# Patient Record
Sex: Female | Born: 1990 | Race: Black or African American | Hispanic: No | Marital: Married | Smoking: Former smoker
Health system: Southern US, Community
[De-identification: ages and names within clinical notes are randomized; demographics above are authoritative.]

## PROBLEM LIST (undated history)

## (undated) DIAGNOSIS — F419 Anxiety disorder, unspecified: Secondary | ICD-10-CM

## (undated) DIAGNOSIS — I499 Cardiac arrhythmia, unspecified: Secondary | ICD-10-CM

## (undated) DIAGNOSIS — F1090 Alcohol use, unspecified, uncomplicated: Secondary | ICD-10-CM

## (undated) DIAGNOSIS — Z87891 Personal history of nicotine dependence: Secondary | ICD-10-CM

## (undated) DIAGNOSIS — Z789 Other specified health status: Secondary | ICD-10-CM

## (undated) DIAGNOSIS — R5383 Other fatigue: Secondary | ICD-10-CM

## (undated) DIAGNOSIS — F329 Major depressive disorder, single episode, unspecified: Secondary | ICD-10-CM

## (undated) DIAGNOSIS — R87629 Unspecified abnormal cytological findings in specimens from vagina: Secondary | ICD-10-CM

## (undated) DIAGNOSIS — R195 Other fecal abnormalities: Secondary | ICD-10-CM

## (undated) DIAGNOSIS — F32A Depression, unspecified: Secondary | ICD-10-CM

## (undated) DIAGNOSIS — K30 Functional dyspepsia: Secondary | ICD-10-CM

## (undated) DIAGNOSIS — D649 Anemia, unspecified: Secondary | ICD-10-CM

## (undated) DIAGNOSIS — R634 Abnormal weight loss: Secondary | ICD-10-CM

## (undated) DIAGNOSIS — E119 Type 2 diabetes mellitus without complications: Secondary | ICD-10-CM

## (undated) DIAGNOSIS — F1291 Cannabis use, unspecified, in remission: Secondary | ICD-10-CM

## (undated) HISTORY — DX: Other fecal abnormalities: R19.5

## (undated) HISTORY — PX: FINGER SURGERY: SHX640

## (undated) HISTORY — DX: Unspecified abnormal cytological findings in specimens from vagina: R87.629

## (undated) HISTORY — DX: Abnormal weight loss: R63.4

## (undated) HISTORY — DX: Depression, unspecified: F32.A

## (undated) HISTORY — DX: Other fatigue: R53.83

## (undated) HISTORY — DX: Major depressive disorder, single episode, unspecified: F32.9

## (undated) HISTORY — DX: Type 2 diabetes mellitus without complications: E11.9

## (undated) HISTORY — DX: Anxiety disorder, unspecified: F41.9

---
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
## 2008-04-17 ENCOUNTER — Emergency Department: Payer: Self-pay | Admitting: Emergency Medicine

## 2008-04-22 ENCOUNTER — Ambulatory Visit: Payer: Self-pay | Admitting: Specialist

## 2008-04-24 ENCOUNTER — Ambulatory Visit: Payer: Self-pay | Admitting: Specialist

## 2008-06-05 IMAGING — US ULTRAOUND OB LIMITED - NRPT [facility]
1 series · 9 of 9 positions shown · non-contrast
Comparison: none

[Series 1: ultraound ob limited - [facility]pt [facility] · 9 of 9 slices shown]
[im 1/9]
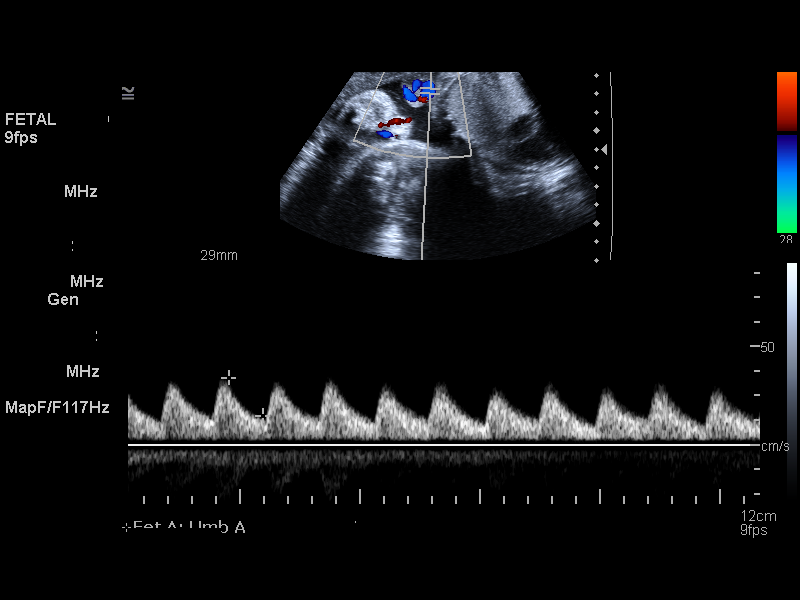
[im 2/9]
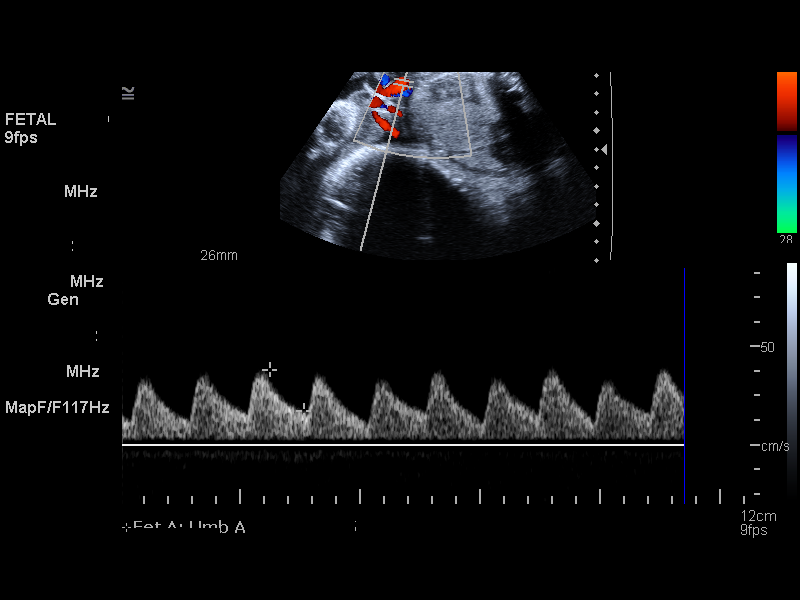
[im 3/9]
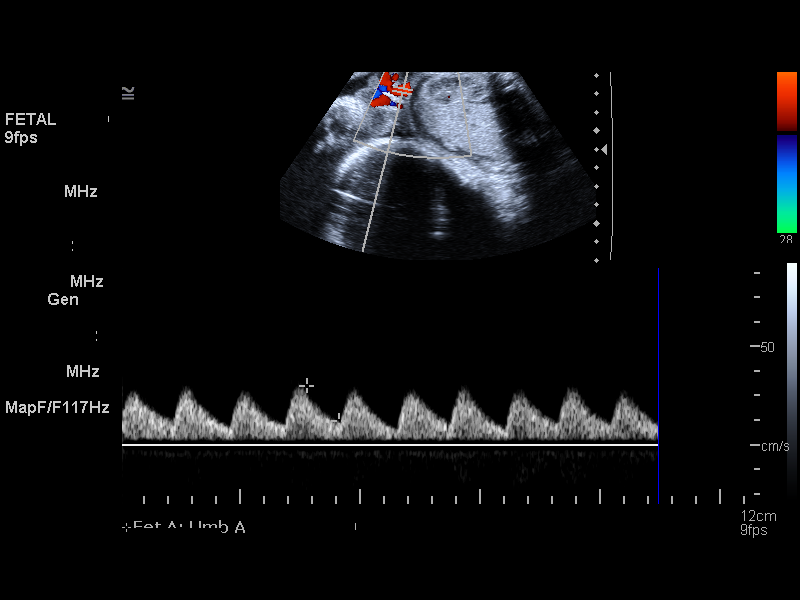
[im 4/9]
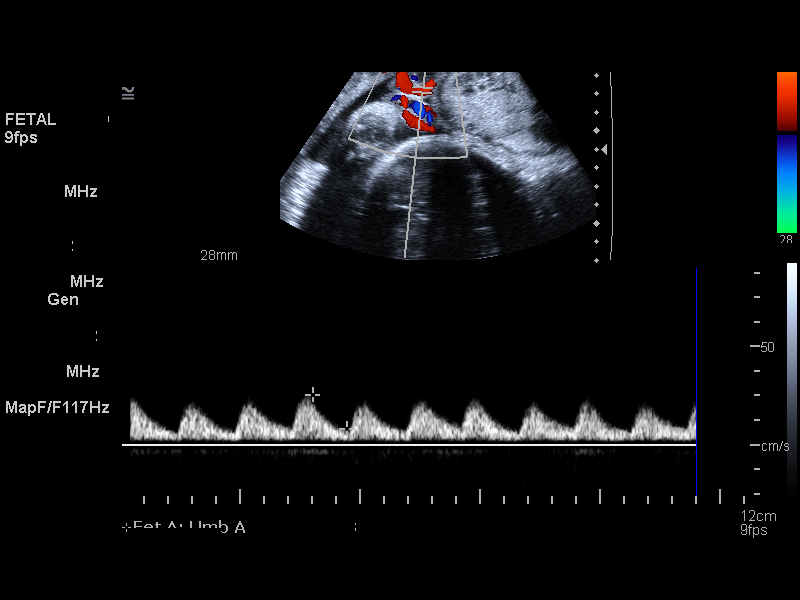
[im 5/9]
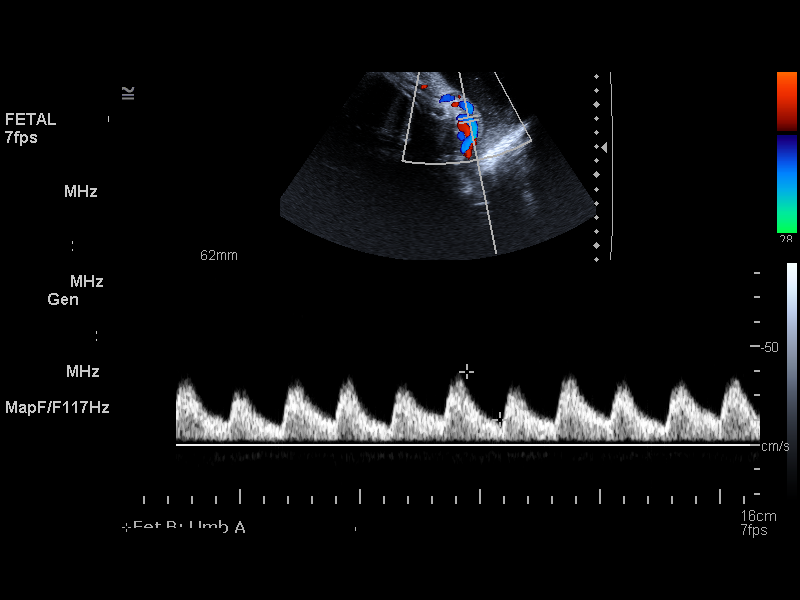
[im 6/9]
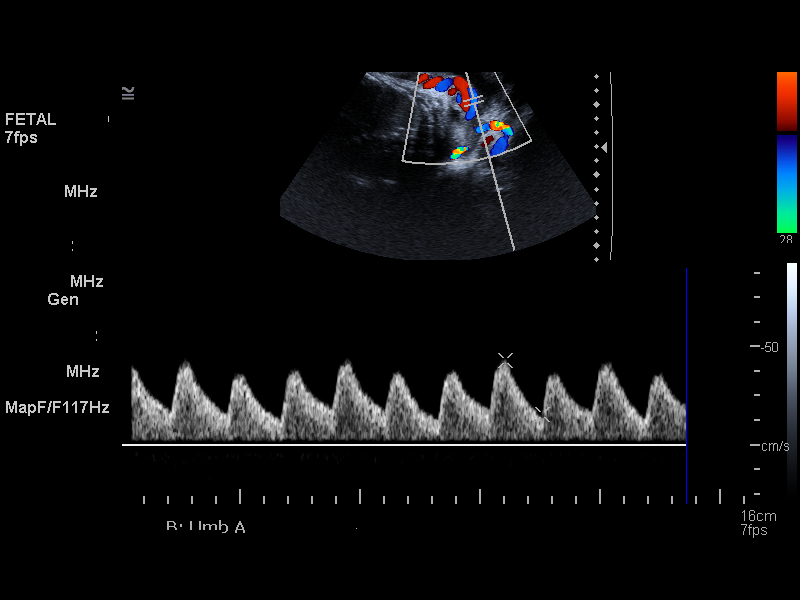
[im 7/9]
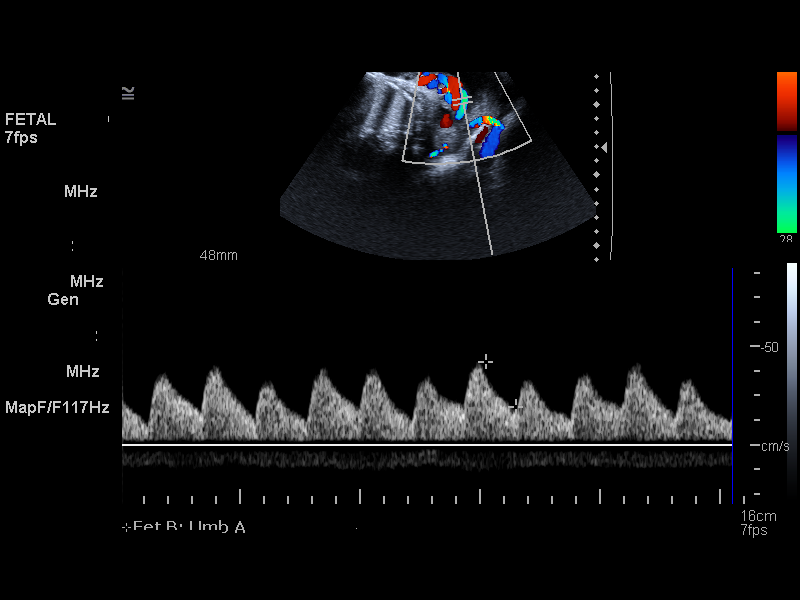
[im 8/9]
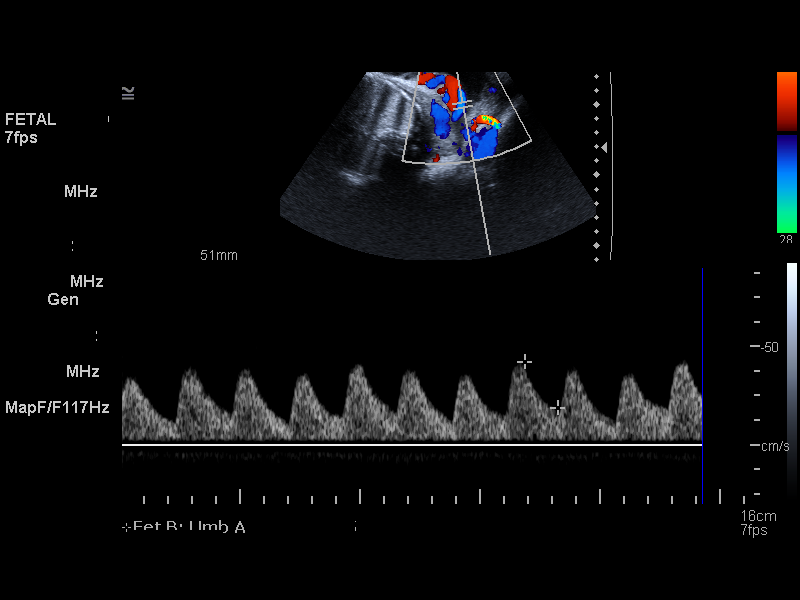
[im 9/9]
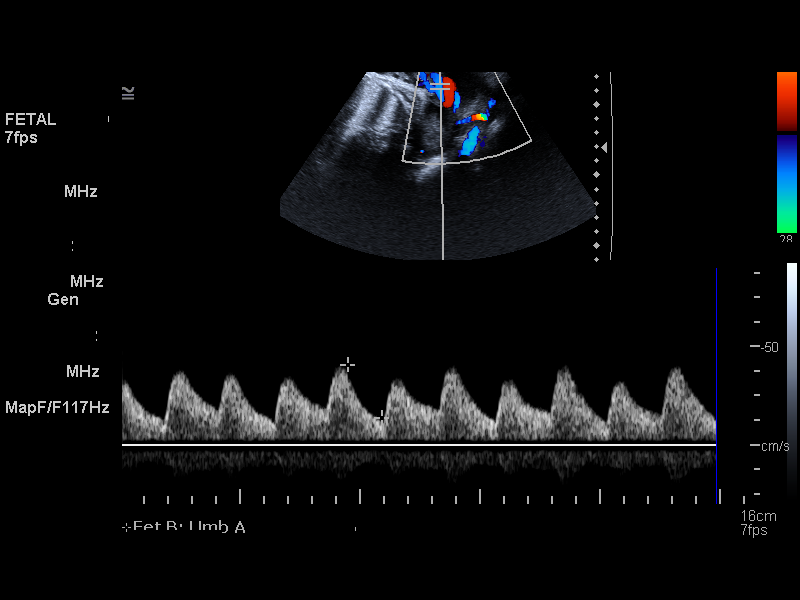

[9 of 9 positions shown; findings below may reference images not displayed]

IMAGES IMPORTED FROM THE [employer] WORKFLOW SYSTEM
NO DICTATION FOR STUDY

## 2009-03-11 IMAGING — CR RIGHT HAND - COMPLETE 3+ VIEW
1 series · 3 of 3 positions shown · non-contrast
Comparison: none

REASON FOR EXAM: blunt injury 4th digit pain left hand
COMMENTS:

[Series 1: view not recorded · 0.17mm/px · 3 of 3 slices shown]
[im 1/3]
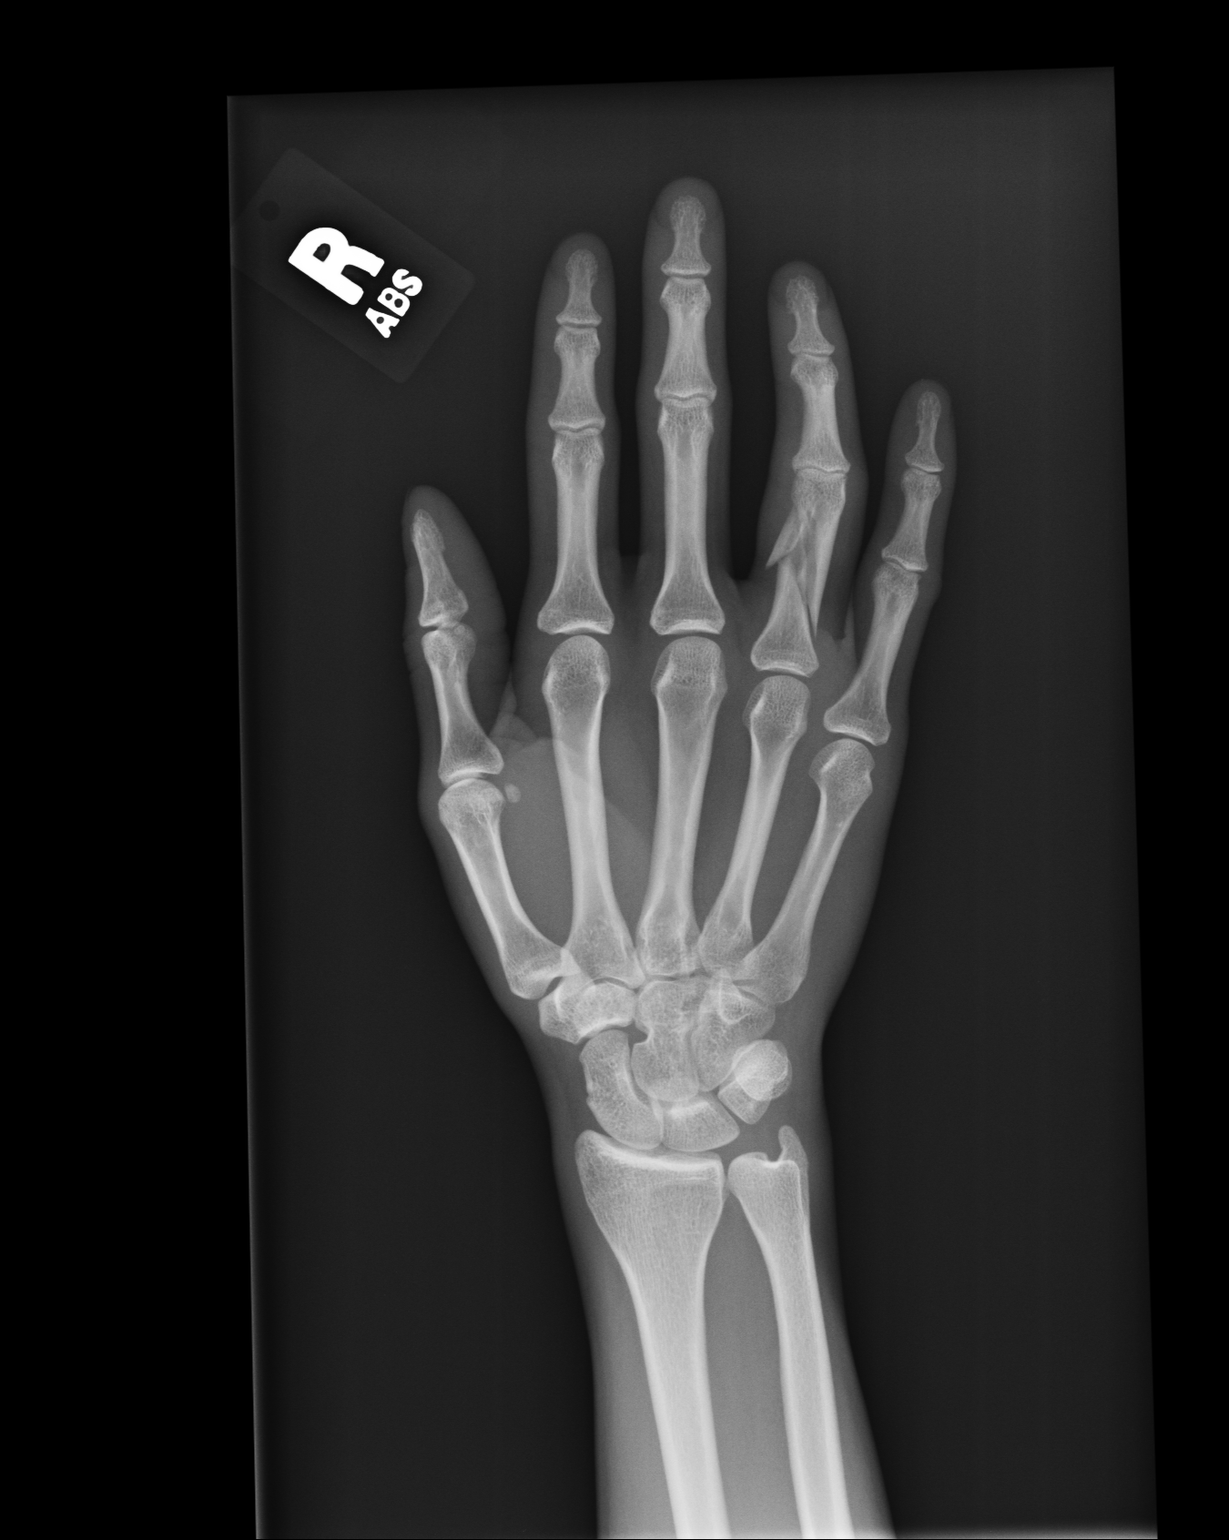
[im 2/3]
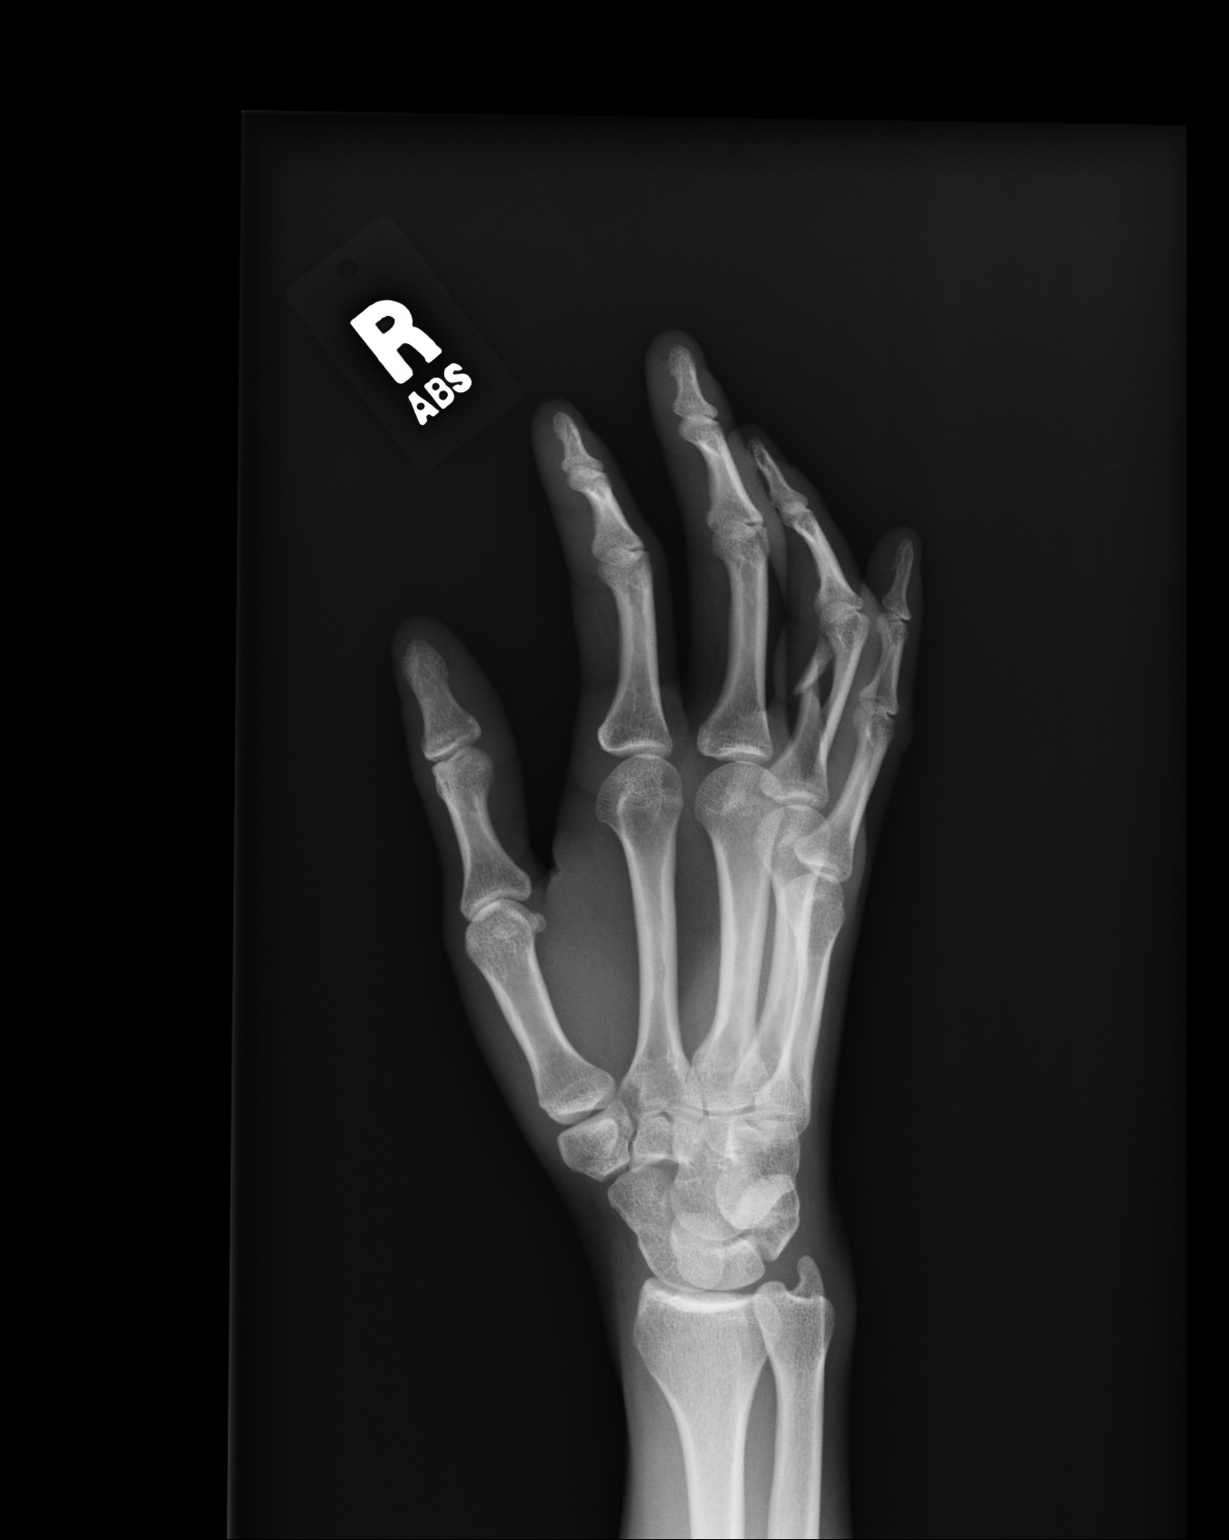
[im 3/3]
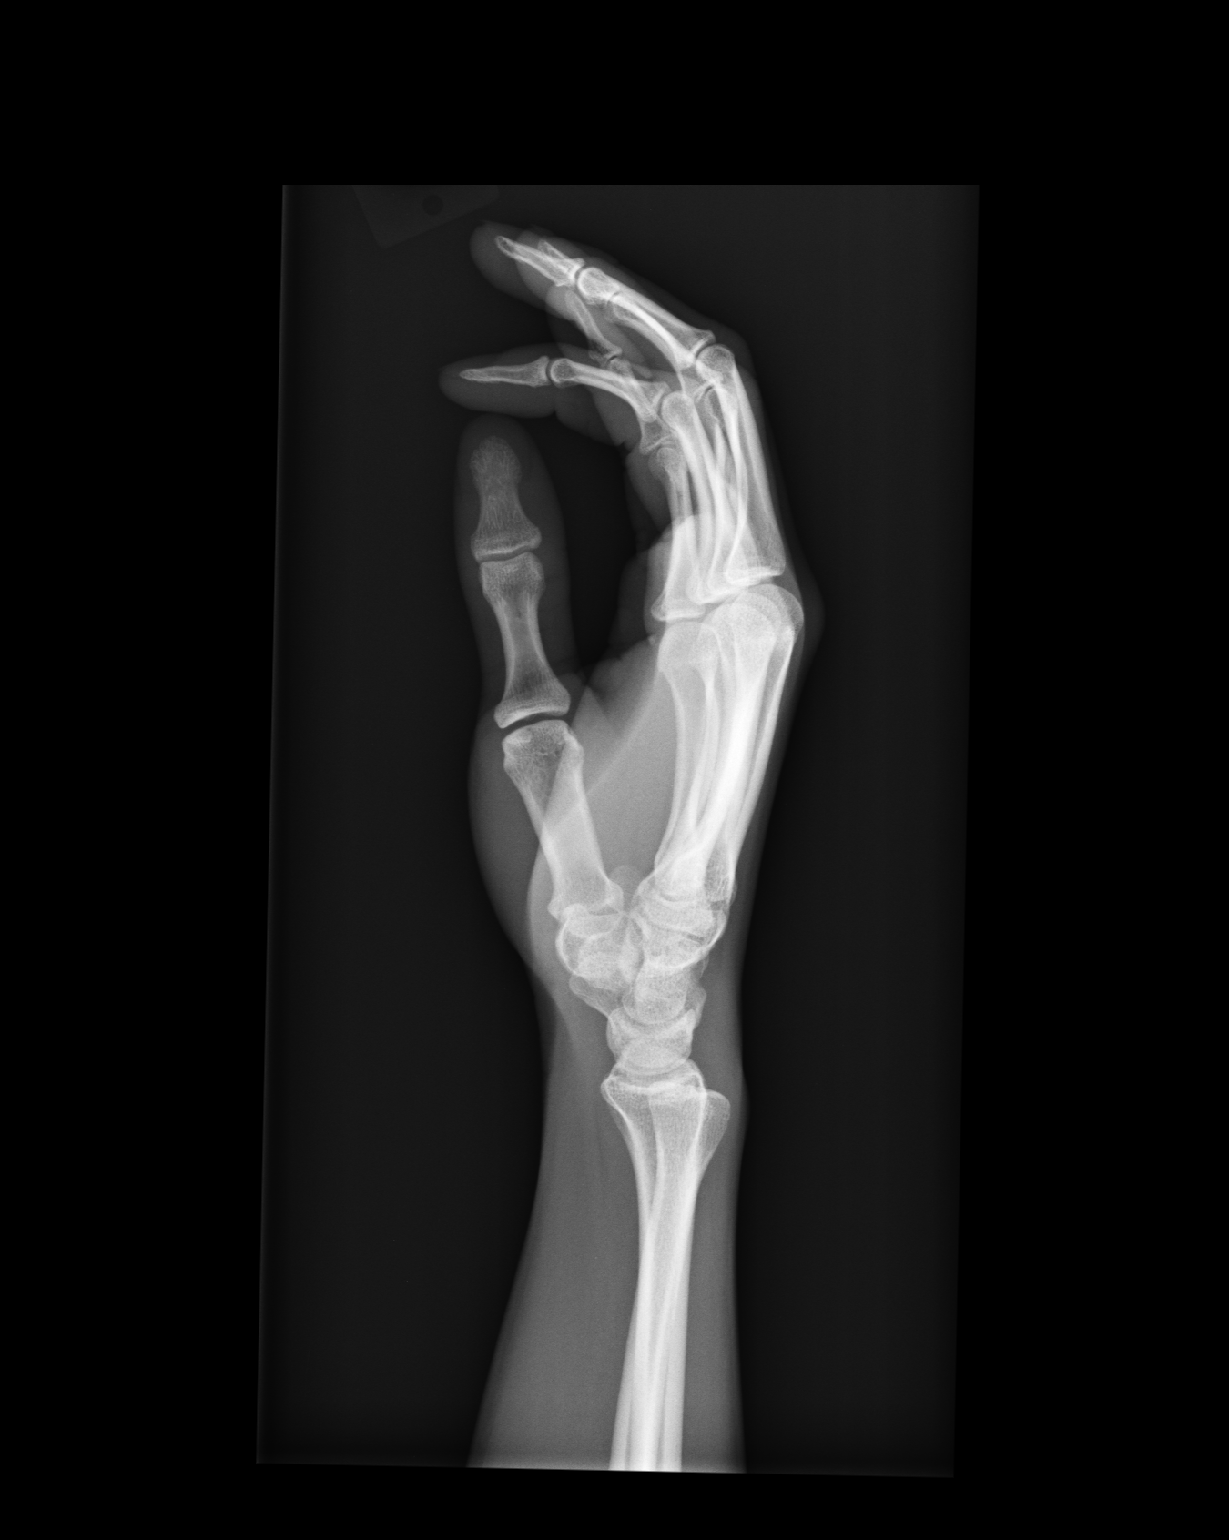

[3 of 3 positions shown; findings below may reference images not displayed]

PROCEDURE:     DXR - DXR HAND RT COMPLETE W/OBLIQUES  - April 17, 2008 [DATE]

RESULT:     Three views of the hand were obtained. There is a comminuted
fracture involving the diaphysis of the proximal phalanx of the fourth
finger. Bony fracture components are mildly displaced. No appreciable
angulation of the major fracture components is seen at this time. No other
fractures are seen.
IMPRESSION: 1.     There is a comminuted fracture of the shaft of the proximal phalanx
of the RIGHT fourth finger.

## 2009-04-03 ENCOUNTER — Emergency Department: Payer: Self-pay | Admitting: Emergency Medicine

## 2009-10-08 ENCOUNTER — Emergency Department: Payer: Self-pay | Admitting: Emergency Medicine

## 2009-10-09 ENCOUNTER — Emergency Department: Payer: Self-pay | Admitting: Emergency Medicine

## 2012-01-10 ENCOUNTER — Emergency Department: Payer: Self-pay | Admitting: Emergency Medicine

## 2012-07-04 ENCOUNTER — Emergency Department: Payer: Self-pay | Admitting: Emergency Medicine

## 2012-07-12 ENCOUNTER — Emergency Department: Payer: Self-pay | Admitting: Emergency Medicine

## 2012-07-12 LAB — URINALYSIS, COMPLETE
Bilirubin,UR: NEGATIVE
Glucose,UR: NEGATIVE mg/dL (ref 0–75)
Ketone: NEGATIVE
Nitrite: NEGATIVE
Ph: 6 (ref 4.5–8.0)
Protein: NEGATIVE
RBC,UR: 5 /HPF (ref 0–5)
Specific Gravity: 1.021 (ref 1.003–1.030)
Squamous Epithelial: 7
WBC UR: 2 /HPF (ref 0–5)

## 2013-03-01 ENCOUNTER — Emergency Department: Payer: Self-pay | Admitting: Emergency Medicine

## 2013-03-26 ENCOUNTER — Emergency Department: Payer: Self-pay | Admitting: Emergency Medicine

## 2013-03-26 LAB — URINALYSIS, COMPLETE
Bacteria: NONE SEEN
Bilirubin,UR: NEGATIVE
Glucose,UR: NEGATIVE mg/dL (ref 0–75)
Ketone: NEGATIVE
Nitrite: NEGATIVE
Ph: 6 (ref 4.5–8.0)
Protein: NEGATIVE
RBC,UR: 6 /HPF (ref 0–5)
Specific Gravity: 1.024 (ref 1.003–1.030)
Squamous Epithelial: 6
WBC UR: 10 /HPF (ref 0–5)

## 2013-03-26 LAB — CBC WITH DIFFERENTIAL/PLATELET
Basophil #: 0.1 10*3/uL (ref 0.0–0.1)
Basophil %: 0.8 %
Eosinophil #: 0.1 10*3/uL (ref 0.0–0.7)
Eosinophil %: 1.1 %
HCT: 41.1 % (ref 35.0–47.0)
HGB: 13.8 g/dL (ref 12.0–16.0)
Lymphocyte #: 2.1 10*3/uL (ref 1.0–3.6)
Lymphocyte %: 20.6 %
MCH: 31.7 pg (ref 26.0–34.0)
MCHC: 33.6 g/dL (ref 32.0–36.0)
MCV: 94 fL (ref 80–100)
Monocyte #: 0.6 x10 3/mm (ref 0.2–0.9)
Monocyte %: 6 %
Neutrophil #: 7.3 10*3/uL — ABNORMAL HIGH (ref 1.4–6.5)
Neutrophil %: 71.5 %
Platelet: 162 10*3/uL (ref 150–440)
RBC: 4.36 10*6/uL (ref 3.80–5.20)
RDW: 13.3 % (ref 11.5–14.5)
WBC: 10.2 10*3/uL (ref 3.6–11.0)

## 2013-03-26 LAB — COMPREHENSIVE METABOLIC PANEL
Albumin: 3.9 g/dL (ref 3.4–5.0)
Alkaline Phosphatase: 67 U/L
Anion Gap: 4 — ABNORMAL LOW (ref 7–16)
BUN: 12 mg/dL (ref 7–18)
Bilirubin,Total: 0.4 mg/dL (ref 0.2–1.0)
Calcium, Total: 9 mg/dL (ref 8.5–10.1)
Chloride: 110 mmol/L — ABNORMAL HIGH (ref 98–107)
Co2: 24 mmol/L (ref 21–32)
Creatinine: 0.79 mg/dL (ref 0.60–1.30)
EGFR (African American): >60
EGFR (Non-African Amer.): >60
Glucose: 82 mg/dL (ref 65–99)
Osmolality: 275 (ref 275–301)
Potassium: 3.3 mmol/L — ABNORMAL LOW (ref 3.5–5.1)
SGOT(AST): 28 U/L (ref 15–37)
SGPT (ALT): 21 U/L (ref 12–78)
Sodium: 138 mmol/L (ref 136–145)
Total Protein: 7.7 g/dL (ref 6.4–8.2)

## 2013-03-26 LAB — LIPASE, BLOOD: Lipase: 171 U/L (ref 73–393)

## 2014-12-16 LAB — HM HIV SCREENING LAB: HM HIV Screening: NEGATIVE

## 2014-12-19 ENCOUNTER — Encounter: Payer: Self-pay | Admitting: Emergency Medicine

## 2014-12-19 ENCOUNTER — Emergency Department
Admission: EM | Admit: 2014-12-19 | Discharge: 2014-12-19 | Disposition: A | Discharge status: Home / Self Care | Payer: Medicaid Other | Attending: Emergency Medicine | Admitting: Emergency Medicine

## 2014-12-19 ENCOUNTER — Emergency Department: Payer: Medicaid Other

## 2014-12-19 DIAGNOSIS — Y9289 Other specified places as the place of occurrence of the external cause: Secondary | ICD-10-CM | POA: Insufficient documentation

## 2014-12-19 DIAGNOSIS — S4991XA Unspecified injury of right shoulder and upper arm, initial encounter: Secondary | ICD-10-CM | POA: Diagnosis not present

## 2014-12-19 DIAGNOSIS — Y998 Other external cause status: Secondary | ICD-10-CM | POA: Diagnosis not present

## 2014-12-19 DIAGNOSIS — Y9389 Activity, other specified: Secondary | ICD-10-CM | POA: Insufficient documentation

## 2014-12-19 DIAGNOSIS — X58XXXA Exposure to other specified factors, initial encounter: Secondary | ICD-10-CM | POA: Diagnosis not present

## 2014-12-19 DIAGNOSIS — M25511 Pain in right shoulder: Secondary | ICD-10-CM

## 2014-12-19 MED ORDER — MELOXICAM 7.5 MG PO TABS: 7.5000 mg | ORAL_TABLET | Freq: Every day | ORAL | Status: AC

## 2014-12-19 NOTE — ED Notes (Signed)
States she "popped" out her right shoulder a couple of days ago  conts to have pain

## 2014-12-19 NOTE — ED Provider Notes (Signed)
[facility] Emergency Department Provider Note  ____________________________________________  Time seen: Approximately 3:15 PM  I have reviewed the triage vital signs and the nursing notes.   HISTORY  Chief Complaint Shoulder Pain   HPI Jessica Mitchell is a 24 y.o. female who presents to the emergency department for evaluation of right shoulder pain. She states that when she was younger, she had an injury that dislocated her right shoulder. Since that time she states that she has been able to feel her shoulder "pop in and out." She reports that she is usually able to relocate the shoulder without any issues, but a few nights ago while playing with her children the shoulder dislocated and has remained painful since that time. She has not been taking anything for pain. She has not been evaluated by orthopedics for this issue.   History reviewed. No pertinent past medical history.  There are no active problems to display for this patient.   History reviewed. No pertinent past surgical history.  Current Outpatient Rx  Name  Route  Sig  Dispense  Refill  . meloxicam (MOBIC) 7.5 MG tablet   Oral   Take 1 tablet (7.5 mg total) by mouth daily.   30 tablet   2     Allergies Review of patient's allergies indicates no known allergies.  No family history on file.  Social History Social History  Substance Use Topics  . Smoking status: Never Smoker   . Smokeless tobacco: None  . Alcohol Use: Yes    Review of Systems Constitutional: No recent illness. Eyes: No visual changes. ENT: No sore throat. Cardiovascular: Denies chest pain or palpitations. Respiratory: Denies shortness of breath. Gastrointestinal: No abdominal pain.  Genitourinary: Negative for dysuria. Musculoskeletal: Pain in right shoulder Skin: Negative for rash. Neurological: Negative for headaches, focal weakness or numbness. 10-point ROS otherwise  negative.  ____________________________________________   PHYSICAL EXAM:  VITAL SIGNS: ED Triage Vitals  Enc Vitals Group     BP --      Pulse --      Resp --      Temp --      Temp src --      SpO2 --      Weight 12/19/14 1509 105 lb (47.628 kg)     Height 12/19/14 1509 5\' 4"  (1.626 m)     Head Cir --      Peak Flow --      Pain Score 12/19/14 1510 9     Pain Loc --      Pain Edu? --      Excl. in GC? --     Constitutional: Alert and oriented. Well appearing and in no acute distress. Eyes: Conjunctivae are normal. EOMI. Head: Atraumatic. Nose: No congestion/rhinnorhea. Neck: No stridor.  Respiratory: Normal respiratory effort.   Musculoskeletal: Full range of motion of the right shoulder is possible, however she reports pain with full extension.  Neurologic:  Normal speech and language. No gross focal neurologic deficits are appreciated. Speech is normal. No gait instability. Neurovascularly intact Skin:  Skin is warm, dry and intact. Atraumatic. Psychiatric: Mood and affect are normal. Speech and behavior are normal.  ____________________________________________   LABS (all labs ordered are listed, but only abnormal results are displayed)  Labs Reviewed - No data to display ____________________________________________  RADIOLOGY  Normal shoulder per radiology reading. ____________________________________________   PROCEDURES  Procedure(s) performed:    ____________________________________________   INITIAL IMPRESSION / ASSESSMENT AND PLAN / ED  COURSE  Pertinent labs & imaging results that were available during my care of the patient were reviewed by me and considered in my medical decision making (see chart for details).  Sling given while in the emergency department with instruction. She is to follow-up with orthopedics for further evaluation. She was advised to return to the emergency department for symptoms that change or worsen if she is unable  schedule an appointment. ____________________________________________   FINAL CLINICAL IMPRESSION(S) / ED DIAGNOSES  Final diagnoses:  Shoulder pain, acute, right       Chinita Pester, FNP 12/19/14 1702  Emily Filbert, MD 12/20/14 2248

## 2014-12-19 NOTE — Discharge Instructions (Signed)

## 2015-01-31 DIAGNOSIS — Z8742 Personal history of other diseases of the female genital tract: Secondary | ICD-10-CM | POA: Insufficient documentation

## 2015-01-31 LAB — HM PAP SMEAR: HM Pap smear: NEGATIVE

## 2015-06-27 ENCOUNTER — Emergency Department
Admission: EM | Admit: 2015-06-27 | Discharge: 2015-06-27 | Disposition: A | Discharge status: Home / Self Care | Payer: Medicaid Other | Attending: Student | Admitting: Student

## 2015-06-27 ENCOUNTER — Encounter: Payer: Self-pay | Admitting: *Deleted

## 2015-06-27 DIAGNOSIS — M25512 Pain in left shoulder: Secondary | ICD-10-CM | POA: Diagnosis not present

## 2015-06-27 DIAGNOSIS — M25511 Pain in right shoulder: Secondary | ICD-10-CM

## 2015-06-27 MED ORDER — TRAMADOL HCL 50 MG PO TABS: 50.0000 mg | ORAL_TABLET | Freq: Four times a day (QID) | ORAL | Status: AC | PRN

## 2015-06-27 MED ORDER — NAPROXEN 500 MG PO TABS
500.0000 mg | ORAL_TABLET | Freq: Two times a day (BID) | ORAL | Status: DC
Start: 2015-06-27 — End: 2017-11-11

## 2015-06-27 MED ORDER — NAPROXEN 500 MG PO TABS
500.0000 mg | ORAL_TABLET | Freq: Once | ORAL | Status: AC
  Administered 2015-06-27: 500 mg via ORAL
  Filled 2015-06-27: qty 1

## 2015-06-27 MED ORDER — TRAMADOL HCL 50 MG PO TABS
50.0000 mg | ORAL_TABLET | Freq: Once | ORAL | Status: AC
  Administered 2015-06-27: 50 mg via ORAL
  Filled 2015-06-27: qty 1

## 2015-06-27 NOTE — ED Notes (Signed)
States several years ago she popped her shoulder out of place and now states continued pain and popping in her shoulders, states pain when she reaches

## 2015-06-27 NOTE — Discharge Instructions (Signed)
With sling on the affected upper extremity pending orthopedic appointment

## 2015-06-27 NOTE — ED Notes (Signed)
Pt very angry, when reviewing discharge pt states "yall dont do anything for me, I want an xray, I am sick of getting sent home with a sling and pills", PA notified

## 2015-06-27 NOTE — ED Notes (Signed)
Pt refused to wait for PA to come out of room, pt states "I want to leave I am going somewhere else"

## 2015-06-27 NOTE — ED Provider Notes (Signed)
[facility] Emergency Department Provider Note   ____________________________________________  Time seen: Approximately 9:34 AM  I have reviewed the triage vital signs and the nursing notes.   HISTORY  Chief Complaint Shoulder Pain    HPI Jessica Mitchell is a 25 y.o. female patient here today for bilateral shoulder pain. Patient states several years she has popped his shoulder out of place and continues to have pain with stretching and overhead reaching. Patient denies subluxation at this time.. Patient was seen 6 months ago and told she was not given follow-up instructions but on to return to ER for condition worsen. Reviewed discharge instructions from last visit patient is status post follow-up with orthopedic clinic. Patient is rating her pain as 8/10. No palliative measures taken for this complaint. History reviewed. No pertinent past medical history.  There are no active problems to display for this patient.   History reviewed. No pertinent past surgical history.  Current Outpatient Rx  Name  Route  Sig  Dispense  Refill  . meloxicam (MOBIC) 7.5 MG tablet   Oral   Take 1 tablet (7.5 mg total) by mouth daily.   30 tablet   2   . naproxen (NAPROSYN) 500 MG tablet   Oral   Take 1 tablet (500 mg total) by mouth 2 (two) times daily with a meal.   20 tablet   0   . traMADol (ULTRAM) 50 MG tablet   Oral   Take 1 tablet (50 mg total) by mouth every 6 (six) hours as needed.   20 tablet   0     Allergies Review of patient's allergies indicates no known allergies.  History reviewed. No pertinent family history.  Social History Social History  Substance Use Topics  . Smoking status: Never Smoker   . Smokeless tobacco: None  . Alcohol Use: Yes    Review of Systems Constitutional: No fever/chills Eyes: No visual changes. ENT: No sore throat. Cardiovascular: Denies chest pain. Respiratory: Denies shortness of  breath. Gastrointestinal: No abdominal pain.  No nausea, no vomiting.  No diarrhea.  No constipation. Genitourinary: Negative for dysuria. Musculoskeletal: Lateral shoulder pain.  Skin: Negative for rash. Neurological: Negative for headaches, focal weakness or numbness.    ____________________________________________   PHYSICAL EXAM:  VITAL SIGNS: ED Triage Vitals  Enc Vitals Group     BP 06/27/15 0837 102/67 mmHg     Pulse Rate 06/27/15 0837 50     Resp 06/27/15 0837 18     Temp 06/27/15 0837 97.5 F (36.4 C)     Temp src --      SpO2 06/27/15 0837 99 %     Weight 06/27/15 0837 100 lb (45.36 kg)     Height 06/27/15 0837  (1.6 m)     Head Cir --      Peak Flow --      Pain Score 06/27/15 0844 8     Pain Loc --      Pain Edu? --      Excl. in GC? --     Constitutional: Alert and oriented. Well appearing and in no acute distress. Eyes: Conjunctivae are normal. PERRL. EOMI. Head: Atraumatic. Nose: No congestion/rhinnorhea. Mouth/Throat: Mucous membranes are moist.  Oropharynx non-erythematous. Neck: No stridor.  No cervical spine tenderness to palpation. Hematological/Lymphatic/Immunilogical: No cervical lymphadenopathy. Cardiovascular: Normal rate, regular rhythm. Grossly normal heart sounds.  Good peripheral circulation. Respiratory: Normal respiratory effort.  No retractions. Lungs CTAB. Gastrointestinal: Soft and nontender.  No distention. No abdominal bruits. No CVA tenderness. Musculoskeletal:No obvious deformity of the bilateral upper extremities. GH joint is intact. Patient has full nuchal range of motion with complaint of pain with overhead extension. Strength is resistance is 4/5 bilaterally.  Neurologic:  Normal speech and language. No gross focal neurologic deficits are appreciated. No gait instability. Skin:  Skin is warm, dry and intact. No rash noted. Psychiatric: Mood and affect are normal. Speech and behavior are  normal.  ____________________________________________   LABS (all labs ordered are listed, but only abnormal results are displayed)  Labs Reviewed - No data to display ____________________________________________  EKG   ____________________________________________  RADIOLOGY  Deferred ____________________________________________   PROCEDURES  Procedure(s) performed: None  Critical Care performed: No  ____________________________________________   INITIAL IMPRESSION / ASSESSMENT AND PLAN / ED COURSE  Pertinent labs & imaging results that were available during my care of the patient were reviewed by me and considered in my medical decision making (see chart for details).  Bilateral shoulder pain with history of subjective bilateral subluxation. Advised patient that of being her best interest to follow-up with orthopedics for definitive evaluation and treatment. Patient shows no deformity of the bilateral upper extremities and advised that a MRI repeated her best interest when she see orthopedics. Patient given a sling and told to wear as needed until evaluation by orthopedics. Patient given a prescription for naproxen and tramadol and also evaluation. Advised patient if she had a subluxation return immediately back to the ER.   ____________________________________________   FINAL CLINICAL IMPRESSION(S) / ED DIAGNOSES  Final diagnoses:  Shoulder pain, bilateral      NEW MEDICATIONS STARTED DURING THIS VISIT:  Discharge Medication List as of 06/27/2015  9:45 AM       Note:  This document was prepared using Dragon voice recognition software and may include unintentional dictation errors.    Joni Reiningonald K Leocadia Idleman, PA-C 06/27/15 1013  Gayla DossEryka A Gayle, MD 06/27/15 1322

## 2016-01-22 ENCOUNTER — Telehealth: Payer: Self-pay | Admitting: Gastroenterology

## 2016-01-22 NOTE — Telephone Encounter (Signed)
Left voice message for patient to call and schedule with GI for muscous in stool, IBS and IBD

## 2016-02-17 ENCOUNTER — Ambulatory Visit: Payer: Medicaid Other | Admitting: Gastroenterology

## 2016-02-17 DIAGNOSIS — R195 Other fecal abnormalities: Secondary | ICD-10-CM | POA: Insufficient documentation

## 2016-02-17 DIAGNOSIS — F329 Major depressive disorder, single episode, unspecified: Secondary | ICD-10-CM | POA: Insufficient documentation

## 2016-02-17 DIAGNOSIS — R634 Abnormal weight loss: Secondary | ICD-10-CM | POA: Insufficient documentation

## 2016-02-17 DIAGNOSIS — F419 Anxiety disorder, unspecified: Secondary | ICD-10-CM | POA: Insufficient documentation

## 2016-02-17 DIAGNOSIS — E119 Type 2 diabetes mellitus without complications: Secondary | ICD-10-CM | POA: Insufficient documentation

## 2016-02-17 DIAGNOSIS — F32A Depression, unspecified: Secondary | ICD-10-CM | POA: Insufficient documentation

## 2016-02-17 DIAGNOSIS — R5383 Other fatigue: Secondary | ICD-10-CM | POA: Insufficient documentation

## 2016-05-17 ENCOUNTER — Ambulatory Visit: Payer: Medicaid Other | Admitting: Obstetrics and Gynecology

## 2016-09-30 ENCOUNTER — Other Ambulatory Visit: Payer: Self-pay | Admitting: Internal Medicine

## 2016-09-30 DIAGNOSIS — R102 Pelvic and perineal pain: Secondary | ICD-10-CM

## 2016-09-30 DIAGNOSIS — R109 Unspecified abdominal pain: Secondary | ICD-10-CM

## 2016-10-05 ENCOUNTER — Ambulatory Visit: Payer: Medicaid Other

## 2017-08-05 ENCOUNTER — Emergency Department
Admission: EM | Admit: 2017-08-05 | Discharge: 2017-08-06 | Disposition: A | Discharge status: Home / Self Care | Payer: Medicaid Other | Attending: Emergency Medicine | Admitting: Emergency Medicine

## 2017-08-05 ENCOUNTER — Encounter: Payer: Self-pay | Admitting: *Deleted

## 2017-08-05 ENCOUNTER — Other Ambulatory Visit: Payer: Self-pay

## 2017-08-05 ENCOUNTER — Emergency Department: Payer: Medicaid Other

## 2017-08-05 DIAGNOSIS — O23591 Infection of other part of genital tract in pregnancy, first trimester: Secondary | ICD-10-CM | POA: Insufficient documentation

## 2017-08-05 DIAGNOSIS — B373 Candidiasis of vulva and vagina: Secondary | ICD-10-CM | POA: Diagnosis not present

## 2017-08-05 DIAGNOSIS — R109 Unspecified abdominal pain: Secondary | ICD-10-CM

## 2017-08-05 DIAGNOSIS — B3731 Acute candidiasis of vulva and vagina: Secondary | ICD-10-CM

## 2017-08-05 DIAGNOSIS — R102 Pelvic and perineal pain: Secondary | ICD-10-CM | POA: Diagnosis not present

## 2017-08-05 DIAGNOSIS — O9989 Other specified diseases and conditions complicating pregnancy, childbirth and the puerperium: Secondary | ICD-10-CM | POA: Diagnosis present

## 2017-08-05 DIAGNOSIS — Z3A01 Less than 8 weeks gestation of pregnancy: Secondary | ICD-10-CM | POA: Diagnosis not present

## 2017-08-05 DIAGNOSIS — O26891 Other specified pregnancy related conditions, first trimester: Secondary | ICD-10-CM

## 2017-08-05 LAB — COMPREHENSIVE METABOLIC PANEL
ALT: 13 U/L — ABNORMAL LOW (ref 14–54)
AST: 20 U/L (ref 15–41)
Albumin: 4.5 g/dL (ref 3.5–5.0)
Alkaline Phosphatase: 47 U/L (ref 38–126)
Anion gap: 9 (ref 5–15)
BUN: 12 mg/dL (ref 6–20)
CO2: 22 mmol/L (ref 22–32)
Calcium: 9.4 mg/dL (ref 8.9–10.3)
Chloride: 104 mmol/L (ref 101–111)
Creatinine, Ser: 0.69 mg/dL (ref 0.44–1.00)
GFR calc Af Amer: >60 mL/min (ref >60)
GFR calc non Af Amer: >60 mL/min (ref >60)
Glucose, Bld: 82 mg/dL (ref 65–99)
Potassium: 3.4 mmol/L — ABNORMAL LOW (ref 3.5–5.1)
Sodium: 135 mmol/L (ref 135–145)
Total Bilirubin: 0.7 mg/dL (ref 0.3–1.2)
Total Protein: 7.7 g/dL (ref 6.5–8.1)

## 2017-08-05 LAB — CBC
HCT: 42.2 % (ref 35.0–47.0)
Hemoglobin: 14.4 g/dL (ref 12.0–16.0)
MCH: 32 pg (ref 26.0–34.0)
MCHC: 34 g/dL (ref 32.0–36.0)
MCV: 94 fL (ref 80.0–100.0)
Platelets: 191 10*3/uL (ref 150–440)
RBC: 4.49 MIL/uL (ref 3.80–5.20)
RDW: 13.4 % (ref 11.5–14.5)
WBC: 14.3 10*3/uL — ABNORMAL HIGH (ref 3.6–11.0)

## 2017-08-05 LAB — URINALYSIS, COMPLETE (UACMP) WITH MICROSCOPIC
Bilirubin Urine: NEGATIVE
Glucose, UA: NEGATIVE mg/dL
Hgb urine dipstick: NEGATIVE
Ketones, ur: NEGATIVE mg/dL
Leukocytes, UA: NEGATIVE
Nitrite: NEGATIVE
Protein, ur: NEGATIVE mg/dL
Specific Gravity, Urine: 1.017 (ref 1.005–1.030)
pH: 6 (ref 5.0–8.0)

## 2017-08-05 LAB — WET PREP, GENITAL
Clue Cells Wet Prep HPF POC: NONE SEEN
Sperm: NONE SEEN
Trich, Wet Prep: NONE SEEN

## 2017-08-05 LAB — HCG, QUANTITATIVE, PREGNANCY: hCG, Beta Chain, Quant, S: 3813 m[IU]/mL — ABNORMAL HIGH (ref <5)

## 2017-08-05 NOTE — ED Triage Notes (Signed)
Pt reports 3 days of lower back pressure and abdominal cramping. Denies vaginal bleeding, discharge or urinary symptoms. Pt reports she just found out on Wednesday she was pregnant (LMP 06/25/17)

## 2017-08-05 NOTE — ED Notes (Signed)
RN to bedside for round. Patient at ultrasound

## 2017-08-06 LAB — CHLAMYDIA/NGC RT PCR (ARMC ONLY)
Chlamydia Tr: NOT DETECTED
N gonorrhoeae: NOT DETECTED

## 2017-08-06 MED ORDER — CLOTRIMAZOLE 1 % EX CREA: 1.0000 "application " | TOPICAL_CREAM | Freq: Every day | CUTANEOUS | 0 refills | Status: AC

## 2017-08-06 NOTE — ED Notes (Signed)
Reviewed discharge instructions, follow-up care, and prescriptions with patient. Patient verbalized understanding of all information reviewed. Patient stable, with no distress noted at this time.    

## 2017-08-06 NOTE — ED Provider Notes (Addendum)
[facility] Emergency Department Provider Note ____________________________________________   First MD Initiated Contact with Patient 08/05/17 2352     (approximate)  I have reviewed the triage vital signs and the nursing notes.   HISTORY  Chief Complaint Abdominal Pain  HPI Jessica Mitchell is a 27 y.o. female with a history of twin gestation who is presenting to the emergency department today with several days of lower abdominal pressure radiating into her back.  She says that she has also been nauseous over the past several days after finding out that she is pregnant.  Last LMP in early May and she estimates that she is about [redacted] weeks pregnant.  Denies any vaginal bleeding or discharge.  Denies any pain at this time.  Says the lower abdominal cramping has been intermittent.  Past Medical History:  Diagnosis Date  . Anxiety   . Depression   . Fatigue   . Mucous in stools   . Type 2 diabetes mellitus (HCC)    pt states she does not have diabetes  . Unintentional weight loss     Patient Active Problem List   Diagnosis Date Noted  . Type 2 diabetes mellitus (HCC)   . Unintentional weight loss   . Fatigue   . Depression   . Anxiety   . Mucous in stools     Past Surgical History:  Procedure Laterality Date  . CESAREAN SECTION    . FINGER SURGERY Right    2/2 trauma    Prior to Admission medications   Medication Sig Start Date End Date Taking? Authorizing Provider  LORazepam (ATIVAN) 0.5 MG tablet take 1 tablet by mouth at bedtime if needed 11/20/15   [provider]  mirtazapine (REMERON) 15 MG tablet Take 15 mg by mouth at bedtime. 01/12/16   [provider]  naproxen (NAPROSYN) 500 MG tablet Take 1 tablet (500 mg total) by mouth 2 (two) times daily with a meal. 06/27/15   Joni ReiningSmith, Ronald K, PA-C  sertraline (ZOLOFT) 50 MG tablet  01/12/16   [provider]    Allergies Patient has no known allergies.  No family  history on file.  Social History Social History   Tobacco Use  . Smoking status: Never Smoker  Substance Use Topics  . Alcohol use: Yes  . Drug use: Not on file    Review of Systems  Constitutional: No fever/chills Eyes: No visual changes. ENT: No sore throat. Cardiovascular: Denies chest pain. Respiratory: Denies shortness of breath. Gastrointestina:  no vomiting.  No diarrhea.  No constipation. Genitourinary: Negative for dysuria. Musculoskeletal: As above Skin: Negative for rash. Neurological: Negative for headaches, focal weakness or numbness.   ____________________________________________   PHYSICAL EXAM:  VITAL SIGNS: ED Triage Vitals  Enc Vitals Group     BP 08/05/17 2100 137/86     Pulse Rate 08/05/17 2100 61     Resp 08/05/17 2100 16     Temp 08/05/17 2100 98 F (36.7 C)     Temp Source 08/05/17 2100 Oral     SpO2 08/05/17 2100 100 %     Weight 08/05/17 2100 100 lb (45.4 kg)     Height 08/05/17 2100 5\' 3"  (1.6 m)     Head Circumference --      Peak Flow --      Pain Score 08/05/17 2059 6     Pain Loc --      Pain Edu? --      Excl. in  GC? --     Constitutional: Alert and oriented. Well appearing and in no acute distress. Eyes: Conjunctivae are normal.  Head: Atraumatic. Nose: No congestion/rhinnorhea. Mouth/Throat: Mucous membranes are moist.  Neck: No stridor.   Cardiovascular: Normal rate, regular rhythm. Grossly normal heart sounds.   Respiratory: Normal respiratory effort.  No retractions. Lungs CTAB. Gastrointestinal: Soft and nontender. No distention. No CVA tenderness. Genitourinary: No external lesions.  Speculum exam with minimal but thick white discharge.  Bimanual exam with a closed cervical loss.  No CMT.  No uterine or adnexal tenderness nor masses palpated. Musculoskeletal: No lower extremity tenderness nor edema.  No joint effusions. Neurologic:  Normal speech and language. No gross focal neurologic deficits are appreciated. Skin:   Skin is warm, dry and intact. No rash noted. Psychiatric: Mood and affect are normal. Speech and behavior are normal.  ____________________________________________   LABS (all labs ordered are listed, but only abnormal results are displayed)  Labs Reviewed  WET PREP, GENITAL - Abnormal; Notable for the following components:      Result Value   Yeast Wet Prep HPF POC PRESENT (*)    WBC, Wet Prep HPF POC FEW (*)    All other components within normal limits  COMPREHENSIVE METABOLIC PANEL - Abnormal; Notable for the following components:   Potassium 3.4 (*)    ALT 13 (*)    All other components within normal limits  CBC - Abnormal; Notable for the following components:   WBC 14.3 (*)    All other components within normal limits  URINALYSIS, COMPLETE (UACMP) WITH MICROSCOPIC - Abnormal; Notable for the following components:   Color, Urine YELLOW (*)    APPearance HAZY (*)    Bacteria, UA RARE (*)    All other components within normal limits  HCG, QUANTITATIVE, PREGNANCY - Abnormal; Notable for the following components:   hCG, Beta Chain, Quant, S 3,813 (*)    All other components within normal limits  CHLAMYDIA/NGC RT PCR ([facility] ONLY)   ____________________________________________  EKG   ____________________________________________  RADIOLOGY  Pending ultrasound ____________________________________________   PROCEDURES  Procedure(s) performed:   Procedures  Critical Care performed:   ____________________________________________   INITIAL IMPRESSION / ASSESSMENT AND PLAN / ED COURSE  Pertinent labs & imaging results that were available during my care of the patient were reviewed by me and considered in my medical decision making (see chart for details).  Differential diagnosis includes, but is not limited to, ovarian cyst, ovarian torsion, acute appendicitis, diverticulitis, urinary tract infection/pyelonephritis, endometriosis, bowel obstruction,  colitis, renal colic, gastroenteritis, hernia, fibroids, endometriosis, pregnancy related pain including ectopic pregnancy, etc. As part of my medical decision making, I reviewed the following data within the electronic MEDICAL RECORD NUMBER Notes from prior ED visits  ----------------------------------------- 12:09 AM on 08/06/2017 -----------------------------------------  Patient positive for Candida vaginalis.  Pending ultrasound results at this time.  Signed out to Dr. York Cerise. ____________________________________________   FINAL CLINICAL IMPRESSION(S) / ED DIAGNOSES  Final diagnoses:  Pelvic pain  Candida vaginalis.  Lower abdominal pain and pregnancy.    NEW MEDICATIONS STARTED DURING THIS VISIT:  New Prescriptions   No medications on file     Note:  This document was prepared using Dragon voice recognition software and may include unintentional dictation errors.     Myrna Blazer, MD 08/06/17 0009  Sound resulted with probable early IUP with gestational sac and small yolk sac.  No fetal pole or cardiac activity visualized.  Recommend follow-up in 10 to  14 days.  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  Patient at this time without distress.  She is aware of the diagnosis as well as the treatment plan will be following of left-sided OB/GYN.  She understands return to the emergency department for any worsening or concerning symptoms.  She will continue with her prenatal vitamins.  Will be DC'd with clotrimazole for her yeast infection.   Myrna Blazer, MD 08/06/17 [id number]

## 2017-08-10 ENCOUNTER — Ambulatory Visit (INDEPENDENT_AMBULATORY_CARE_PROVIDER_SITE_OTHER): Payer: Medicaid Other | Admitting: Obstetrics and Gynecology

## 2017-08-10 ENCOUNTER — Telehealth: Payer: Self-pay

## 2017-08-10 ENCOUNTER — Encounter: Payer: Self-pay | Admitting: Obstetrics and Gynecology

## 2017-08-10 VITALS — BP 104/60 | HR 92 | Wt 107.0 lb

## 2017-08-10 DIAGNOSIS — O09291 Supervision of pregnancy with other poor reproductive or obstetric history, first trimester: Secondary | ICD-10-CM

## 2017-08-10 DIAGNOSIS — O0990 Supervision of high risk pregnancy, unspecified, unspecified trimester: Secondary | ICD-10-CM | POA: Insufficient documentation

## 2017-08-10 DIAGNOSIS — O34219 Maternal care for unspecified type scar from previous cesarean delivery: Secondary | ICD-10-CM

## 2017-08-10 DIAGNOSIS — Z3A01 Less than 8 weeks gestation of pregnancy: Secondary | ICD-10-CM | POA: Diagnosis not present

## 2017-08-10 DIAGNOSIS — Z131 Encounter for screening for diabetes mellitus: Secondary | ICD-10-CM

## 2017-08-10 DIAGNOSIS — Z98891 History of uterine scar from previous surgery: Secondary | ICD-10-CM | POA: Insufficient documentation

## 2017-08-10 NOTE — Telephone Encounter (Signed)
Pt has apt tomorrow. Pt reports seeing mucus when using the bathroom yesterday. Inquiring if this is normal. [phone]

## 2017-08-10 NOTE — Telephone Encounter (Signed)
Pt called back & r/s her apt for today. She has been seen by [facility] for NOB

## 2017-08-10 NOTE — Progress Notes (Signed)
New Obstetric Patient H&P   Chief Complaint: "Desires prenatal care"  History of Present Illness: Patient is a 26 y.o. [id number] Not Hispanic or Latino female, unsure LMP 06/25/17 presents with amenorrhea and positive home pregnancy test. Based on her  LMP, her EDD is Estimated Date of Delivery: 04/01/18 and her EGA is [redacted]w[redacted]d. Cycles are 4. days, regular, and occur approximately every : 28 days. She does not remember when her last pap smear was. She notes a history of an abnormal pap smear, but doesn't recall when that was.    She had a urine pregnancy test which was positive 1 week(s)  ago. Her last menstrual period was normal and lasted for  4 day(s). Since her LMP she claims she has experienced abdominal pain for which she went to the ER. On 6/14 she had a pelvic ultrasound that showed a gestational sac without a fetal pole.  The report also mentions a faintly visualized yolk sac.  She denies vaginal bleeding. Her past medical history is questionable for type 2 diabetes (noted in her chart, but patient states she has never been diagnosed with diabetes) . Her prior pregnancies are notable for twin gestations (can't remember type of twins, delivered at [facility]).  Preterm delivery with twins, cesarean section delivery at 36 weeks.     Since her LMP, she admits to the use of tobacco products  no She claims she has lost a few pounds since the start of her pregnancy.  There are cats in the home in the home  no  She admits close contact with children on a regular basis  yes  She has had chicken pox in the past yes She has had Tuberculosis exposures, symptoms, or previously tested positive for TB   no Current or past history of domestic violence. no  Genetic Screening/Teratology Counseling: (Includes patient, baby's father, or anyone in either family with:)   1. Patient's age >/= 72 [facility]  no 2. Thalassemia ([place], [place], Mediterranean, or Asian background): MCV<80  no 3. Neural tube defect  (meningomyelocele, spina bifida, anencephaly)  no 4. Congenital heart defect  no  5. Down syndrome  no 6. Tay-Sachs (Jewish, [place]  no 7. Canavan's Disease  no 8. Sickle cell disease or trait (African)  no  9. Hemophilia or other blood disorders  no  10. Muscular dystrophy  no  11. Cystic fibrosis  no  12. Huntington's Chorea  no  13. Mental retardation/autism  no 14. Other inherited genetic or chromosomal disorder  no 15. Maternal metabolic disorder (DM, PKU, etc)  no 16. Patient or FOB with a child with a birth defect not listed above no  16a. Patient or FOB with a birth defect themselves no 17. Recurrent pregnancy loss, or stillbirth  no  18. Any medications since LMP other than prenatal vitamins (include vitamins, supplements, OTC meds, drugs, alcohol)  Yes (topical lotramin) 19. Any other genetic/environmental exposure to discuss  no  Infection History:   1. Lives with someone with TB or TB exposed  no  2. Patient or partner has history of genital herpes  no 3. Rash or viral illness since LMP  no 4. History of STI (GC, CT, HPV, syphilis, HIV)  Yes (gonorrhea and trimchomas, both treated) 5. History of recent travel :  no  Other pertinent information:  no   Review of Systems:10 point review of systems negative unless otherwise noted in HPI  Past Medical History:  Diagnosis Date  . Anxiety   .  Depression   . Fatigue   . Mucous in stools   . Type 2 diabetes mellitus (HCC)    pt states she does not have diabetes  . Unintentional weight loss     Past Surgical History:  Procedure Laterality Date  . CESAREAN SECTION    . FINGER SURGERY Right    2/2 trauma    Gynecologic History: Patient's last menstrual period was 06/25/2017.  Obstetric History: G2P0102  Family History: Type 2 diabetes  Social History   Socioeconomic History  . Marital status: Single    Spouse name: Not on file  . Number of children: Not on file  . Years of education: Not on file    . Highest education level: Not on file  Occupational History  . Not on file  Social Needs  . Financial resource strain: Not on file  . Food insecurity:    Worry: Not on file    Inability: Not on file  . Transportation needs:    Medical: Not on file    Non-medical: Not on file  Tobacco Use  . Smoking status: Never Smoker  Substance and Sexual Activity  . Alcohol use: Yes  . Drug use: Not on file  . Sexual activity: Not on file  Lifestyle  . Physical activity:    Days per week: Not on file    Minutes per session: Not on file  . Stress: Not on file  Relationships  . Social connections:    Talks on phone: Not on file    Gets together: Not on file    Attends religious service: Not on file    Active member of club or organization: Not on file    Attends meetings of clubs or organizations: Not on file    Relationship status: Not on file  . Intimate partner violence:    Fear of current or ex partner: Not on file    Emotionally abused: Not on file    Physically abused: Not on file    Forced sexual activity: Not on file  Other Topics Concern  . Not on file  Social History Narrative  . Not on file    No Known Allergies  Prior to Admission medications: denies    Physical Exam BP 104/60   Pulse 92   Wt 107 lb (48.5 kg)   LMP 06/25/2017   BMI 18.95 kg/m   Physical Exam  Constitutional: She is oriented to person, place, and time. She appears well-developed and well-nourished. No distress.  HENT:  Head: Normocephalic and atraumatic.  Eyes: Conjunctivae are normal.  Neck: Normal range of motion. Neck supple. No thyromegaly present.  Cardiovascular: Normal rate, regular rhythm and normal heart sounds. Exam reveals no gallop and no friction rub.  No murmur heard. Pulmonary/Chest: Effort normal and breath sounds normal. She has no wheezes.  Abdominal: Soft. She exhibits no distension and no mass. There is no tenderness. There is no rebound and no guarding. No hernia.  Hernia confirmed negative in the right inguinal area and confirmed negative in the left inguinal area.  Genitourinary: Vagina normal and uterus normal. Pelvic exam was performed with patient supine. There is no rash, tenderness or lesion on the right labia. There is no rash, tenderness or lesion on the left labia. Uterus is not deviated, not enlarged, not fixed and not tender. Cervix exhibits no motion tenderness, no discharge and no friability. Right adnexum displays no mass, no tenderness and no fullness. Left adnexum displays no mass, no tenderness  and no fullness.  Musculoskeletal: Normal range of motion.  Lymphadenopathy:    She has no cervical adenopathy.       Right: No inguinal adenopathy present.       Left: No inguinal adenopathy present.  Neurological: She is alert and oriented to person, place, and time.  Skin: Skin is warm and dry. No rash noted.  Psychiatric: She has a normal mood and affect. Her behavior is normal. Judgment normal.    Female Chaperone present during breast and/or pelvic exam.   Assessment: 27 y.o. G2P0102 at 3771w4d presenting to initiate prenatal care  Plan: 1) Avoid alcoholic beverages. 2) Patient encouraged not to smoke.  3) Discontinue the use of all non-medicinal drugs and chemicals.  4) Take prenatal vitamins daily.  5) Nutrition, food safety (fish, cheese advisories, and high nitrite foods) and exercise discussed. 6) Hospital and practice style discussed with cross coverage system.  7) Genetic Screening, such as with 1st Trimester Screening, cell free fetal DNA, AFP testing, and Ultrasound, as well as with amniocentesis and CVS as appropriate, is discussed with patient. At the conclusion of today's visit patient undecided genetic testing 8) Patient is asked about travel to areas at risk for the [place] virus, and counseled to avoid travel and exposure to mosquitoes or sexual partners who may have themselves been exposed to the virus. Testing is discussed, and  will be ordered as appropriate.  9) follow up with beta hCG today and ultrasound 14 days from initial ultrasound to verify pregnancy.   10) question of T2DM: will get hemoglobin A1c today. Will consider early 1 hour gtt with this history of possible type 2 DM. If positive, will treat per protocol.   Thomasene MohairStephen Jermiya Reichl, MD 08/10/2017 10:33 AM

## 2017-08-11 ENCOUNTER — Encounter: Payer: Self-pay | Admitting: Obstetrics and Gynecology

## 2017-08-11 ENCOUNTER — Encounter: Payer: Self-pay | Admitting: Advanced Practice Midwife

## 2017-08-11 ENCOUNTER — Telehealth: Payer: Self-pay | Admitting: Obstetrics and Gynecology

## 2017-08-11 NOTE — Telephone Encounter (Signed)
Patient is calling for lab results please advise  ?

## 2017-08-12 ENCOUNTER — Encounter (INDEPENDENT_AMBULATORY_CARE_PROVIDER_SITE_OTHER): Payer: Self-pay

## 2017-08-12 ENCOUNTER — Encounter: Payer: Self-pay | Admitting: Obstetrics and Gynecology

## 2017-08-12 NOTE — Telephone Encounter (Signed)
This patient had labs drawn on 6/19. However, I have results yet. She is calling for them. Would you mind checking the status of these labs with Victorino DikeJennifer? thanks

## 2017-08-13 LAB — IGP, RFX APTIMA HPV ASCU: PAP Smear Comment: 0

## 2017-08-13 LAB — RPR+RH+ABO+RUB AB+AB SCR+CB...
Antibody Screen: NEGATIVE
HIV Screen 4th Generation wRfx: NONREACTIVE
Hematocrit: 42.1 % (ref 34.0–46.6)
Hemoglobin: 14.5 g/dL (ref 11.1–15.9)
Hepatitis B Surface Ag: NEGATIVE
MCH: 31.9 pg (ref 26.6–33.0)
MCHC: 34.4 g/dL (ref 31.5–35.7)
MCV: 93 fL (ref 79–97)
Platelets: 204 10*3/uL (ref 150–450)
RBC: 4.54 x10E6/uL (ref 3.77–5.28)
RDW: 13.3 % (ref 12.3–15.4)
RPR Ser Ql: NONREACTIVE
Rh Factor: NEGATIVE
Rubella Antibodies, IGG: 4.15 index (ref >0.99)
Varicella zoster IgG: 1200 index (ref >165)
WBC: 10.8 10*3/uL (ref 3.4–10.8)

## 2017-08-13 LAB — HGB A1C W/O EAG: Hgb A1c MFr Bld: 4.8 % (ref 4.8–5.6)

## 2017-08-13 LAB — BETA HCG QUANT (REF LAB): hCG Quant: 11003 m[IU]/mL

## 2017-08-13 LAB — HEMOGLOBINOPATHY EVALUATION
HGB C: 0 %
HGB S: 0 %
HGB VARIANT: 0 %
Hemoglobin A2 Quantitation: 2.4 % (ref 1.8–3.2)
Hemoglobin F Quantitation: 0 % (ref 0.0–2.0)
Hgb A: 97.6 % (ref 96.4–98.8)

## 2017-08-14 ENCOUNTER — Encounter: Payer: Self-pay | Admitting: Obstetrics and Gynecology

## 2017-08-15 ENCOUNTER — Other Ambulatory Visit: Payer: Self-pay | Admitting: Obstetrics and Gynecology

## 2017-08-15 ENCOUNTER — Other Ambulatory Visit: Payer: Medicaid Other

## 2017-08-15 DIAGNOSIS — O0990 Supervision of high risk pregnancy, unspecified, unspecified trimester: Secondary | ICD-10-CM

## 2017-08-15 LAB — URINE DRUG PANEL 7
Amphetamines, Urine: NEGATIVE ng/mL
Barbiturate Quant, Ur: NEGATIVE ng/mL
Benzodiazepine Quant, Ur: NEGATIVE ng/mL
Cannabinoid Quant, Ur: POSITIVE — AB
Cocaine (Metab.): NEGATIVE ng/mL
Opiate Quant, Ur: NEGATIVE ng/mL
PCP Quant, Ur: NEGATIVE ng/mL

## 2017-08-15 LAB — URINE CULTURE: Organism ID, Bacteria: NO GROWTH

## 2017-08-16 LAB — BETA HCG QUANT (REF LAB): hCG Quant: 26599 m[IU]/mL

## 2017-08-19 ENCOUNTER — Ambulatory Visit (INDEPENDENT_AMBULATORY_CARE_PROVIDER_SITE_OTHER): Payer: Medicaid Other

## 2017-08-19 ENCOUNTER — Encounter: Payer: Self-pay | Admitting: Obstetrics and Gynecology

## 2017-08-19 ENCOUNTER — Ambulatory Visit (INDEPENDENT_AMBULATORY_CARE_PROVIDER_SITE_OTHER): Payer: Medicaid Other | Admitting: Obstetrics and Gynecology

## 2017-08-19 VITALS — BP 104/58 | Wt 102.0 lb

## 2017-08-19 DIAGNOSIS — Z3A01 Less than 8 weeks gestation of pregnancy: Secondary | ICD-10-CM | POA: Diagnosis not present

## 2017-08-19 DIAGNOSIS — O34219 Maternal care for unspecified type scar from previous cesarean delivery: Secondary | ICD-10-CM

## 2017-08-19 DIAGNOSIS — O219 Vomiting of pregnancy, unspecified: Secondary | ICD-10-CM

## 2017-08-19 DIAGNOSIS — O0990 Supervision of high risk pregnancy, unspecified, unspecified trimester: Secondary | ICD-10-CM

## 2017-08-19 DIAGNOSIS — O3680X0 Pregnancy with inconclusive fetal viability, not applicable or unspecified: Secondary | ICD-10-CM | POA: Diagnosis not present

## 2017-08-19 MED ORDER — DOXYLAMINE SUCCINATE (SLEEP) 25 MG PO TABS: 25.0000 mg | ORAL_TABLET | Freq: Every evening | ORAL | 11 refills | Status: DC | PRN

## 2017-08-19 MED ORDER — PYRIDOXINE HCL 25 MG PO TABS: 25.0000 mg | ORAL_TABLET | Freq: Four times a day (QID) | ORAL | 11 refills | Status: DC

## 2017-08-19 NOTE — Progress Notes (Signed)
Routine Prenatal Care Visit  Subjective  Jessica Mitchell is a 27 y.o. G2P0102 at [redacted]w[redacted]d being seen today for ongoing prenatal care.  She is currently monitored for the following issues for this low-risk pregnancy and has Unintentional weight loss; Fatigue; Depression; Anxiety; Mucous in stools; Supervision of high risk pregnancy, antepartum; and History of cesarean section on their problem list.  ----------------------------------------------------------------------------------- Patient reports hip and leg pain, small amount of persistant brown discharge, decline examination.   Contractions: Not present. Vag. Bleeding: Scant.  Movement: Absent. Denies leaking of fluid.  ----------------------------------------------------------------------------------- The following portions of the patient's history were reviewed and updated as appropriate: allergies, current medications, past family history, past medical history, past social history, past surgical history and problem list. Problem list updated.   Objective  Blood pressure (!) 104/58, weight 102 lb (46.3 kg), last menstrual period 06/25/2017. Pregravid weight 107 lb (48.5 kg) Total Weight Gain -5 lb (-2.268 kg) Urinalysis: Urine Protein: Negative Urine Glucose: Negative  Fetal Status: Fetal Heart Rate (bpm): 131   Movement: Absent     General:  Alert, oriented and cooperative. Patient is in no acute distress.  Skin: Skin is warm and dry. No rash noted.   Cardiovascular: Normal heart rate noted  Respiratory: Normal respiratory effort, no problems with respiration noted  Abdomen: Soft, gravid, appropriate for gestational age. Pain/Pressure: Present     Pelvic:  Cervical exam deferred        Extremities: Normal range of motion.     ental Status: Normal mood and affect. Normal behavior. Normal judgment and thought content.     Assessment   27 y.o. G2P0102 at [redacted]w[redacted]d by  04/01/2018, by Last Menstrual Period presenting for routine prenatal  visit  Plan   Pregnancy #2 Problems (from 08/10/17 to present)    Problem Noted Resolved   Supervision of high risk pregnancy, antepartum 08/10/2017 by Conard Novak, MD No   Overview Signed 08/19/2017 10:28 AM by Natale Milch, MD      [facility] Prenatal Labs  Dating 6 wk [place] Blood type: O/Negative/-- (06/19 1130)   Genetic Screen 1 Screen:     AFP:      Quad:      NIPS:    Antibody:Negative (06/19 1130)  Anatomic [place]  Rubella: 4.15 (06/19 1130)  Varicella: Immune  GTT Early:        28 wk:      RPR: Non Reactive (06/19 1130)   Rhogam  HBsAg: Negative (06/19 1130)   TDaP vaccine                       HIV: Non Reactive (06/19 1130)   Flu Shot                                GBS:   Contraception  Pap:  CBB     CS/VBAC    Baby Food  Breast   Support Person             History of cesarean section 08/10/2017 by Conard Novak, MD No       Gestational age appropriate obstetric precautions including but not limited to vaginal bleeding, contractions, leaking of fluid and fetal movement were reviewed in detail with the patient.    Boost shake supplement prescription sent.  Pyridoxine and unisom prescriptions sent to the pharmacy Given information on prenatal classes with [facility].  Discussed breast feeding. Patient is planning on breast feeding. Patient was given resources and lactation consultation was advised.  Return in about 1 month (around 09/16/2017) for [name].  Adelene Idlerhristanna Augie Vane MD [facility], [facility] 08/19/17 10:34 AM

## 2017-08-19 NOTE — Progress Notes (Signed)
[name] Dating/Viability u/s today Heartburn/ indigestion Joint pain in hips and knees

## 2017-08-22 ENCOUNTER — Encounter: Payer: Self-pay | Admitting: Advanced Practice Midwife

## 2017-08-22 NOTE — Telephone Encounter (Signed)
Patient is requesting an lab appointment please advise. Please order lab so I may contact patient

## 2017-08-26 ENCOUNTER — Encounter: Payer: Medicaid Other | Admitting: Advanced Practice Midwife

## 2017-08-31 ENCOUNTER — Other Ambulatory Visit: Payer: Self-pay | Admitting: Obstetrics and Gynecology

## 2017-08-31 NOTE — Telephone Encounter (Signed)
Spoke with Patient and she is requesting for her hormone level to be check. Patient was advise to call and schedule appointment. There was no order place. Patient is schedule 09/02/17 at 10:00 am for lab. Please advise

## 2017-09-01 ENCOUNTER — Encounter: Payer: Self-pay | Admitting: Obstetrics and Gynecology

## 2017-09-02 ENCOUNTER — Telehealth: Payer: Self-pay

## 2017-09-02 ENCOUNTER — Other Ambulatory Visit: Payer: Medicaid Other

## 2017-09-02 NOTE — Telephone Encounter (Signed)
8 wk OB. Pt reports brown discharge. [phone]

## 2017-09-02 NOTE — Telephone Encounter (Signed)
Spoke w/pt. Inquired if she had intercourse in the past 72 hours. Pt states she did have IC in the past 2-3 days. Pt reports brown d/c was on her underwear, but has not seen since. Notified normal after intercourse/in early pregnancy. Monitor for increased spotting/bright red blood or excessive cramping. Pt will notify of any such s&s.

## 2017-09-03 ENCOUNTER — Other Ambulatory Visit: Payer: Self-pay

## 2017-09-03 DIAGNOSIS — Z3A08 8 weeks gestation of pregnancy: Secondary | ICD-10-CM | POA: Diagnosis not present

## 2017-09-03 DIAGNOSIS — O99810 Abnormal glucose complicating pregnancy: Secondary | ICD-10-CM | POA: Diagnosis not present

## 2017-09-03 DIAGNOSIS — E119 Type 2 diabetes mellitus without complications: Secondary | ICD-10-CM | POA: Insufficient documentation

## 2017-09-03 DIAGNOSIS — O9989 Other specified diseases and conditions complicating pregnancy, childbirth and the puerperium: Secondary | ICD-10-CM | POA: Diagnosis present

## 2017-09-03 DIAGNOSIS — Z79899 Other long term (current) drug therapy: Secondary | ICD-10-CM | POA: Diagnosis not present

## 2017-09-03 DIAGNOSIS — O200 Threatened abortion: Secondary | ICD-10-CM | POA: Diagnosis not present

## 2017-09-03 LAB — ABO/RH: ABO/RH(D): O NEG

## 2017-09-03 NOTE — ED Triage Notes (Signed)
Patient reports noticed dark blood with wiping yesterday, lighter in color today also noticed "white clumps" today.

## 2017-09-04 ENCOUNTER — Emergency Department: Payer: Medicaid Other

## 2017-09-04 ENCOUNTER — Emergency Department
Admission: EM | Admit: 2017-09-04 | Discharge: 2017-09-04 | Disposition: A | Discharge status: Home / Self Care | Payer: Medicaid Other | Attending: Emergency Medicine | Admitting: Emergency Medicine

## 2017-09-04 DIAGNOSIS — O4690 Antepartum hemorrhage, unspecified, unspecified trimester: Secondary | ICD-10-CM

## 2017-09-04 DIAGNOSIS — O200 Threatened abortion: Secondary | ICD-10-CM

## 2017-09-04 LAB — CBC WITH DIFFERENTIAL/PLATELET
Basophils Absolute: 0.1 10*3/uL (ref 0–0.1)
Basophils Relative: 1 %
Eosinophils Absolute: 0.4 10*3/uL (ref 0–0.7)
Eosinophils Relative: 2 %
HCT: 36.9 % (ref 35.0–47.0)
Hemoglobin: 13 g/dL (ref 12.0–16.0)
Lymphocytes Relative: 20 %
Lymphs Abs: 3.3 10*3/uL (ref 1.0–3.6)
MCH: 32.5 pg (ref 26.0–34.0)
MCHC: 35.2 g/dL (ref 32.0–36.0)
MCV: 92.3 fL (ref 80.0–100.0)
Monocytes Absolute: 1 10*3/uL — ABNORMAL HIGH (ref 0.2–0.9)
Monocytes Relative: 6 %
Neutro Abs: 11.5 10*3/uL — ABNORMAL HIGH (ref 1.4–6.5)
Neutrophils Relative %: 71 %
Platelets: 186 10*3/uL (ref 150–440)
RBC: 4 MIL/uL (ref 3.80–5.20)
RDW: 12.7 % (ref 11.5–14.5)
WBC: 16.2 10*3/uL — ABNORMAL HIGH (ref 3.6–11.0)

## 2017-09-04 LAB — URINALYSIS, COMPLETE (UACMP) WITH MICROSCOPIC
Bacteria, UA: NONE SEEN
Bilirubin Urine: NEGATIVE
Glucose, UA: NEGATIVE mg/dL
Ketones, ur: NEGATIVE mg/dL
Leukocytes, UA: NEGATIVE
Nitrite: NEGATIVE
Protein, ur: NEGATIVE mg/dL
Specific Gravity, Urine: 1.012 (ref 1.005–1.030)
pH: 5 (ref 5.0–8.0)

## 2017-09-04 LAB — WET PREP, GENITAL
Clue Cells Wet Prep HPF POC: NONE SEEN
Sperm: NONE SEEN
Trich, Wet Prep: NONE SEEN
Yeast Wet Prep HPF POC: NONE SEEN

## 2017-09-04 LAB — ANTIBODY SCREEN: Antibody Screen: NEGATIVE

## 2017-09-04 LAB — CHLAMYDIA/NGC RT PCR (ARMC ONLY)
Chlamydia Tr: NOT DETECTED
N gonorrhoeae: NOT DETECTED

## 2017-09-04 LAB — HCG, QUANTITATIVE, PREGNANCY: hCG, Beta Chain, Quant, S: 137379 m[IU]/mL — ABNORMAL HIGH (ref <5)

## 2017-09-04 MED ORDER — RHO D IMMUNE GLOBULIN 1500 UNIT/2ML IJ SOSY
300.0000 ug | PREFILLED_SYRINGE | Freq: Once | INTRAMUSCULAR | Status: AC
  Administered 2017-09-04: 300 ug via INTRAMUSCULAR
  Filled 2017-09-04: qty 2

## 2017-09-04 NOTE — ED Notes (Signed)
Pt waiting patiently with visitor for treatment room; informed of ordered US;

## 2017-09-04 NOTE — ED Notes (Addendum)
Message sent to pharmacy for med; pt updated on delay

## 2017-09-04 NOTE — Discharge Instructions (Addendum)
Avoid tampons, douching or sexual intercourse until seen by your doctor.  Return to the ER for worsening symptoms, persistent vomiting, difficulty breathing, soaking more than 1 maxi pad per hour or other concerns.

## 2017-09-04 NOTE — ED Notes (Signed)
ED Provider at bedside. 

## 2017-09-04 NOTE — ED Notes (Signed)
Pt c/o spotting dark blood then large white clumps that reminds pt of yeast "smells like baby formula", pt report 8.[redacted] weeks pregnant, first preg was twins 4 weeks premature, pt denies taking any meds, reports cramping pain at periumbilical area

## 2017-09-04 NOTE — ED Notes (Signed)
Call to pharmacy for missing dose, informed dose in blood bank att  Shelly, blood bank, called to prepare dose

## 2017-09-04 NOTE — ED Notes (Signed)
Pt in US

## 2017-09-04 NOTE — ED Provider Notes (Signed)
[facility] Emergency Department Provider Note   ____________________________________________   First MD Initiated Contact with Patient 09/04/17 [phone]     (approximate)  I have reviewed the triage vital signs and the nursing notes.   HISTORY  Chief Complaint Vaginal Bleeding and Vaginal Discharge    HPI Jessica Mitchell is a 27 y.o. female who presents to the ED from home with a chief complaint of vaginal spotting.  Patient is G2 P2 at approximately [redacted] weeks pregnant with a history of twin pregnancy.  Reports dark vaginal spotting yesterday which has lightened up by today.  Associated with vaginal discharge.  Last sexual intercourse 4 days ago.  She was seen in the ED 08/06/2017 and found to have vaginal yeast infection and probable early IUP on ultrasound.  Denies associated fever, chills, chest pain, shortness of breath, abdominal pain, nausea, vomiting, dysuria.  Denies recent travel or trauma.   Past Medical History:  Diagnosis Date  . Anxiety   . Depression   . Fatigue   . Mucous in stools   . Type 2 diabetes mellitus (HCC)    pt states she does not have diabetes  . Unintentional weight loss     Patient Active Problem List   Diagnosis Date Noted  . Supervision of high risk pregnancy, antepartum 08/10/2017  . History of cesarean section 08/10/2017  . Unintentional weight loss   . Fatigue   . Depression   . Anxiety   . Mucous in stools     Past Surgical History:  Procedure Laterality Date  . CESAREAN SECTION    . FINGER SURGERY Right    2/2 trauma    Prior to Admission medications   Medication Sig Start Date End Date Taking? Authorizing Provider  doxylamine, Sleep, (UNISOM) 25 MG tablet Take 1 tablet (25 mg total) by mouth at bedtime as needed (nausea and vomiting). 08/19/17   Natale MilchSchuman, Christanna R, MD  LORazepam (ATIVAN) 0.5 MG tablet take 1 tablet by mouth at bedtime if needed 11/20/15   [provider]  mirtazapine (REMERON)  15 MG tablet Take 15 mg by mouth at bedtime. 01/12/16   [provider]  naproxen (NAPROSYN) 500 MG tablet Take 1 tablet (500 mg total) by mouth 2 (two) times daily with a meal. Patient not taking: Reported on 08/10/2017 06/27/15   Joni ReiningSmith, Ronald K, PA-C  pyridOXINE (VITAMIN B-6) 25 MG tablet Take 1 tablet (25 mg total) by mouth 4 (four) times daily. 08/19/17   Natale MilchSchuman, Christanna R, MD  sertraline (ZOLOFT) 50 MG tablet  01/12/16   [provider]    Allergies Patient has no known allergies.  No family history on file.  Social History Social History   Tobacco Use  . Smoking status: Never Smoker  Substance Use Topics  . Alcohol use: Yes  . Drug use: Yes    Types: Marijuana    Review of Systems  Constitutional: No fever/chills Eyes: No visual changes. ENT: No sore throat. Cardiovascular: Denies chest pain. Respiratory: Denies shortness of breath. Gastrointestinal: No abdominal pain.  No nausea, no vomiting.  No diarrhea.  No constipation. Genitourinary: Positive for vaginal spotting and discharge.  Negative for dysuria. Musculoskeletal: Negative for back pain. Skin: Negative for rash. Neurological: Negative for headaches, focal weakness or numbness.   ____________________________________________   PHYSICAL EXAM:  VITAL SIGNS: ED Triage Vitals  Enc Vitals Group     BP 09/03/17 2306 123/62     Pulse Rate 09/03/17 2306 79  Resp 09/03/17 2306 18     Temp 09/03/17 2306 98.6 F (37 C)     Temp Source 09/03/17 2306 Oral     SpO2 09/03/17 2306 99 %     Weight --      Height --      Head Circumference --      Peak Flow --      Pain Score 09/03/17 2307 0     Pain Loc --      Pain Edu? --      Excl. in GC? --     Constitutional: Alert and oriented. Well appearing and in no acute distress. Eyes: Conjunctivae are normal. PERRL. EOMI. Head: Atraumatic. Nose: No congestion/rhinnorhea. Mouth/Throat: Mucous membranes are moist.  Oropharynx  non-erythematous. Neck: No stridor.   Cardiovascular: Normal rate, regular rhythm. Grossly normal heart sounds.  Good peripheral circulation. Respiratory: Normal respiratory effort.  No retractions. Lungs CTAB. Gastrointestinal: Soft and nontender to light or deep palpation. No distention. No abdominal bruits. No CVA tenderness. Musculoskeletal: No lower extremity tenderness nor edema.  No joint effusions. Neurologic:  Normal speech and language. No gross focal neurologic deficits are appreciated. No gait instability. Skin:  Skin is warm, dry and intact. No rash noted. Psychiatric: Mood and affect are normal. Speech and behavior are normal.  ____________________________________________   LABS (all labs ordered are listed, but only abnormal results are displayed)  Labs Reviewed  WET PREP, GENITAL - Abnormal; Notable for the following components:      Result Value   WBC, Wet Prep HPF POC FEW (*)    All other components within normal limits  HCG, QUANTITATIVE, PREGNANCY - Abnormal; Notable for the following components:   hCG, Beta Chain, Quant, S [id number] (*)    All other components within normal limits  CBC WITH DIFFERENTIAL/PLATELET - Abnormal; Notable for the following components:   WBC 16.2 (*)    Neutro Abs 11.5 (*)    Monocytes Absolute 1.0 (*)    All other components within normal limits  URINALYSIS, COMPLETE (UACMP) WITH MICROSCOPIC - Abnormal; Notable for the following components:   Color, Urine YELLOW (*)    APPearance CLEAR (*)    Hgb urine dipstick SMALL (*)    All other components within normal limits  CHLAMYDIA/NGC RT PCR ([facility] ONLY)  ABO/RH  RHOGAM INJECTION  ANTIBODY SCREEN   ____________________________________________  EKG  None ____________________________________________  RADIOLOGY  ED MD interpretation: 8 weeks 6-day IUP  Official radiology report(s): US Ob Comp Less 14 Wks  Result Date: 09/04/2017 CLINICAL DATA:  27 year old pregnant  female with vaginal bleeding. LMP: 06/25/2017 corresponding to an estimated gestational age of [redacted] weeks, 1 day. EXAM: OBSTETRIC <14 WK [place] AND TRANSVAGINAL OB US TECHNIQUE: Both transabdominal and transvaginal ultrasound examinations were performed for complete evaluation of the gestation as well as the maternal uterus, adnexal regions, and pelvic cul-de-sac. Transvaginal technique was performed to assess early pregnancy. COMPARISON:  Ultrasound dated 08/05/2017 FINDINGS: Intrauterine gestational sac: None Yolk sac:  Visualized. Embryo:  Visualized. Cardiac Activity: Visualized. Heart Rate: 175 bpm CRL: 22 mm   8 w   6 d                  [place] EDC: 04/10/2017 Subchorionic hemorrhage:  None visualized. Maternal uterus/adnexae: The ovaries appear unremarkable. The right ovary measures 4.1 x 2.2 x 2.9 cm and the left ovary measures 4.4 x 1.9 x 2.6 cm. There is a corpus luteum in the right ovary. IMPRESSION: Single live  intrauterine pregnancy with an estimated gestational age of [redacted] weeks, 6 days. Please note the gestational age based on ultrasound is behind the estimated age based on provided LMP. Electronically Signed   By: Elgie Collard M.D.   On: 09/04/2017 05:13   US Ob Transvaginal  Result Date: 09/04/2017 CLINICAL DATA:  27 year old pregnant female with vaginal bleeding. LMP: 06/25/2017 corresponding to an estimated gestational age of [redacted] weeks, 1 day. EXAM: OBSTETRIC <14 WK [place] AND TRANSVAGINAL OB US TECHNIQUE: Both transabdominal and transvaginal ultrasound examinations were performed for complete evaluation of the gestation as well as the maternal uterus, adnexal regions, and pelvic cul-de-sac. Transvaginal technique was performed to assess early pregnancy. COMPARISON:  Ultrasound dated 08/05/2017 FINDINGS: Intrauterine gestational sac: None Yolk sac:  Visualized. Embryo:  Visualized. Cardiac Activity: Visualized. Heart Rate: 175 bpm CRL: 22 mm   8 w   6 d                  [place] EDC: 04/10/2017 Subchorionic  hemorrhage:  None visualized. Maternal uterus/adnexae: The ovaries appear unremarkable. The right ovary measures 4.1 x 2.2 x 2.9 cm and the left ovary measures 4.4 x 1.9 x 2.6 cm. There is a corpus luteum in the right ovary. IMPRESSION: Single live intrauterine pregnancy with an estimated gestational age of [redacted] weeks, 6 days. Please note the gestational age based on ultrasound is behind the estimated age based on provided LMP. Electronically Signed   By: Elgie Collard M.D.   On: 09/04/2017 05:13    ____________________________________________   PROCEDURES  Procedure(s) performed:   Pelvic exam: External exam within normal limits without rashes, lesions or vesicles.  Speculum exam reveals brownish-white discharge.  Cervical os is closed.  Light brown discharge coming from cervix.  No active vaginal bleeding or clots.  Bimanual exam within normal limits.  Procedures  Critical Care performed: No  ____________________________________________   INITIAL IMPRESSION / ASSESSMENT AND PLAN / ED COURSE  As part of my medical decision making, I reviewed the following data within the electronic MEDICAL RECORD NUMBER Nursing notes reviewed and incorporated, Labs reviewed, Old chart reviewed, Radiograph reviewed  and Notes from prior ED visits   27 year old female G2 P2 twin pregnancy who presents with vaginal spotting and discharge. Differential diagnosis includes, but is not limited to, ovarian cyst, ovarian torsion, acute appendicitis, diverticulitis, urinary tract infection/pyelonephritis, endometriosis, bowel obstruction, colitis, renal colic, gastroenteritis, hernia, fibroids, endometriosis, pregnancy related pain including ectopic pregnancy, etc.  Updated patient of ultrasound result.  Will check CBC, urinalysis, wet prep.  Patient is not concerned for STD exposure.  Blood type is O-.  Given that I did visualize lightly bloody discharge coming from the cervix, will administer RhoGam.   Clinical  Course as of Sep 04 704  Sun Sep 04, 2017  0645 Patient resting no acute distress.  Updated her on CBC, wet prep and urinalysis results.  Will discharge home after administration of IM RhoGam.  She will follow-up closely with her OB/GYN.  Strict return precautions given.  Patient verbalizes understanding and agrees with plan of care.   [JS]    Clinical Course User Index [JS] Irean Hong, MD     ____________________________________________   FINAL CLINICAL IMPRESSION(S) / ED DIAGNOSES  Final diagnoses:  Threatened miscarriage  Vaginal bleeding in pregnancy     ED Discharge Orders    None       Note:  This document was prepared using Dragon voice recognition software and may include unintentional  dictation errors.    Irean Hong, MD 09/04/17 [phone]

## 2017-09-05 LAB — RHOGAM INJECTION: Unit division: 0

## 2017-09-16 ENCOUNTER — Ambulatory Visit (INDEPENDENT_AMBULATORY_CARE_PROVIDER_SITE_OTHER): Payer: Medicaid Other | Admitting: Advanced Practice Midwife

## 2017-09-16 ENCOUNTER — Encounter: Payer: Self-pay | Admitting: Advanced Practice Midwife

## 2017-09-16 VITALS — BP 112/66 | Wt 107.0 lb

## 2017-09-16 DIAGNOSIS — O34219 Maternal care for unspecified type scar from previous cesarean delivery: Secondary | ICD-10-CM

## 2017-09-16 DIAGNOSIS — Z3A10 10 weeks gestation of pregnancy: Secondary | ICD-10-CM

## 2017-09-16 DIAGNOSIS — Z1379 Encounter for other screening for genetic and chromosomal anomalies: Secondary | ICD-10-CM

## 2017-09-16 NOTE — Progress Notes (Signed)
  Routine Prenatal Care Visit  Subjective  Jessica Mitchell is a 27 y.o. G2P0102 at 782w4d being seen today for ongoing prenatal care.  She is currently monitored for the following issues for this high-risk pregnancy and has Unintentional weight loss; Fatigue; Depression; Anxiety; Mucous in stools; Supervision of high risk pregnancy, antepartum; and History of cesarean section on their problem list.  ----------------------------------------------------------------------------------- Patient reports no complaints.    . Vag. Bleeding: None.  Movement: Absent. Denies leaking of fluid.  ----------------------------------------------------------------------------------- The following portions of the patient's history were reviewed and updated as appropriate: allergies, current medications, past family history, past medical history, past social history, past surgical history and problem list. Problem list updated.   Objective  Blood pressure 112/66, weight 107 lb (48.5 kg), last menstrual period 06/25/2017. Pregravid weight 107 lb (48.5 kg) Total Weight Gain 0 lb (0 kg) Urinalysis: Urine Protein: Negative Urine Glucose: Negative  Fetal Status: Fetal Heart Rate (bpm): 172   Movement: Absent     General:  Alert, oriented and cooperative. Patient is in no acute distress.  Skin: Skin is warm and dry. No rash noted.   Cardiovascular: Normal heart rate noted  Respiratory: Normal respiratory effort, no problems with respiration noted  Abdomen: Soft, gravid, appropriate for gestational age. Pain/Pressure: Absent     Pelvic:  Cervical exam deferred        Extremities: Normal range of motion.  Edema: None  Mental Status: Normal mood and affect. Normal behavior. Normal judgment and thought content.   Assessment   27 y.o. G2P0102 at 3582w4d by  04/10/2018, by Ultrasound presenting for routine prenatal visit  Plan   Pregnancy #2 Problems (from 08/10/17 to present)    Problem Noted Resolved   Supervision of high risk pregnancy, antepartum 08/10/2017 by Conard NovakJackson, Stephen D, MD No   Overview Signed 08/19/2017 10:28 AM by Natale MilchSchuman, Christanna R, MD      Clinic [facility] Prenatal Labs  Dating 6 wk US Blood type: O/Negative/-- (06/19 1130)   Genetic Screen 1 Screen:     AFP:      Quad:      NIPS:    Antibody:Negative (06/19 1130)  Anatomic US  Rubella: 4.15 (06/19 1130)  Varicella: Immune  GTT Early:        28 wk:      RPR: Non Reactive (06/19 1130)   Rhogam  HBsAg: Negative (06/19 1130)   TDaP vaccine                       HIV: Non Reactive (06/19 1130)   Flu Shot                                GBS:   Contraception  Pap:  CBB     CS/VBAC    Baby Food  Breast   Support Person             History of cesarean section 08/10/2017 by Conard NovakJackson, Stephen D, MD No       Preterm labor symptoms and general obstetric precautions including but not limited to vaginal bleeding, contractions, leaking of fluid and fetal movement were reviewed in detail with the patient. Please refer to After Visit Summary for other counseling recommendations.   Return in about 1 month (around 10/14/2017) for rob.  Tresea MallJane Tinika Bucknam, CNM 09/16/2017 3:59 PM

## 2017-09-16 NOTE — Progress Notes (Signed)
[name] ER follow up cramping/spotting

## 2017-09-16 NOTE — Patient Instructions (Signed)

## 2017-09-21 LAB — MATERNIT 21 PLUS CORE, BLOOD
Chromosome 13: NEGATIVE
Chromosome 18: NEGATIVE
Chromosome 21: NEGATIVE
Y Chromosome: DETECTED

## 2017-09-23 ENCOUNTER — Telehealth: Payer: Self-pay

## 2017-09-23 NOTE — Telephone Encounter (Signed)
Pt called to see if her results for the materniti 21 were back and I have placed the gender in an envelope for the pt to pick up on Monday

## 2017-09-27 ENCOUNTER — Ambulatory Visit (INDEPENDENT_AMBULATORY_CARE_PROVIDER_SITE_OTHER): Payer: Medicaid Other | Admitting: Obstetrics and Gynecology

## 2017-09-27 VITALS — BP 98/52 | Wt 110.0 lb

## 2017-09-27 DIAGNOSIS — O9989 Other specified diseases and conditions complicating pregnancy, childbirth and the puerperium: Secondary | ICD-10-CM

## 2017-09-27 DIAGNOSIS — O34219 Maternal care for unspecified type scar from previous cesarean delivery: Secondary | ICD-10-CM

## 2017-09-27 DIAGNOSIS — O0990 Supervision of high risk pregnancy, unspecified, unspecified trimester: Secondary | ICD-10-CM

## 2017-09-27 DIAGNOSIS — K529 Noninfective gastroenteritis and colitis, unspecified: Secondary | ICD-10-CM

## 2017-09-27 DIAGNOSIS — Z3A12 12 weeks gestation of pregnancy: Secondary | ICD-10-CM

## 2017-09-27 MED ORDER — ONDANSETRON 4 MG PO TBDP: 4.0000 mg | ORAL_TABLET | Freq: Four times a day (QID) | ORAL | 0 refills | Status: DC | PRN

## 2017-09-27 NOTE — Progress Notes (Signed)
Work-in plevic pain/vomiting

## 2017-09-27 NOTE — Progress Notes (Signed)
    Routine Prenatal Care Visit  Subjective  Jessica Mitchell is a 27 y.o. G2P0102 at 6830w1d being seen today for ongoing prenatal care.  She is currently monitored for the following issues for this high-risk pregnancy and has Depression; Anxiety; Supervision of high risk pregnancy, antepartum; and History of cesarean section on their problem list.  ----------------------------------------------------------------------------------- Patient reports sharp pain mid abdomen, nausea and emesis.  Sick contact at work with GI symptoms..   Contractions: Not present. Vag. Bleeding: None.  Movement: Absent. Denies leaking of fluid.  ----------------------------------------------------------------------------------- The following portions of the patient's history were reviewed and updated as appropriate: allergies, current medications, past family history, past medical history, past social history, past surgical history and problem list. Problem list updated.   Objective  Blood pressure (!) 98/52, weight 110 lb (49.9 kg), last menstrual period 06/25/2017. Pregravid weight 107 lb (48.5 kg) Total Weight Gain 3 lb (1.361 kg) Urinalysis:      Fetal Status: Fetal Heart Rate (bpm): 155   Movement: Absent     General:  Alert, oriented and cooperative. Patient is in no acute distress.  Skin: Skin is warm and dry. No rash noted.   Cardiovascular: Normal heart rate noted  Respiratory: Normal respiratory effort, no problems with respiration noted  Abdomen: Soft, gravid, appropriate for gestational age. Pain/Pressure: Present     Pelvic:  Cervical exam deferred        Extremities: Normal range of motion.     ental Status: Normal mood and affect. Normal behavior. Normal judgment and thought content.     Assessment   27 y.o. G2P0102 at 3630w1d by  04/10/2018, by Ultrasound presenting for work-in prenatal visit  Plan   Pregnancy #2 Problems (from 08/10/17 to present)    Problem Noted Resolved   Supervision  of high risk pregnancy, antepartum 08/10/2017 by Conard NovakJackson, Stephen D, MD No   Overview Addendum 09/27/2017 11:06 AM by Vena AustriaStaebler, Kashmir Leedy, MD      [facility]inic [facility] Prenatal Labs  Dating 6 wk US Blood type: O/Negative/-- (06/19 1130)   Genetic Screen NIPS: normal XX   Antibody:Negative (06/19 1130)  Anatomic US  Rubella: 4.15 (06/19 1130)  Varicella: Immune  GTT  RPR: Non Reactive (06/19 1130)   Rhogam [ ]  28 weeks HBsAg: Negative (06/19 1130)   TDaP vaccine                       HIV: Non Reactive (06/19 1130)   Flu Shot                                GBS:   Contraception  Pap: 08/10/2017 NIL  CBB     CS/VBAC    Baby Food Breast   Support Person  Fana           History of cesarean section 08/10/2017 by Conard NovakJackson, Stephen D, MD No       Gestational age appropriate obstetric precautions including but not limited to vaginal bleeding, contractions, leaking of fluid and fetal movement were reviewed in detail with the patient.   - Viral gastroenteritis - stress supportive measures and hydration - Rx Zofran - out of work for next 48-hrs  Return if symptoms worsen or fail to improve, otherwise keep regularly scheduled prenatal visit.  Vena AustriaAndreas Raechelle Sarti, MD, Evern CoreFACOG [facility], [facility] 09/27/2017, 1:19 PM

## 2017-10-14 ENCOUNTER — Ambulatory Visit (INDEPENDENT_AMBULATORY_CARE_PROVIDER_SITE_OTHER): Payer: Medicaid Other | Admitting: Obstetrics and Gynecology

## 2017-10-14 ENCOUNTER — Encounter: Payer: Self-pay | Admitting: Obstetrics and Gynecology

## 2017-10-14 VITALS — BP 114/68 | Wt 113.0 lb

## 2017-10-14 DIAGNOSIS — Z3A14 14 weeks gestation of pregnancy: Secondary | ICD-10-CM

## 2017-10-14 DIAGNOSIS — O0990 Supervision of high risk pregnancy, unspecified, unspecified trimester: Secondary | ICD-10-CM

## 2017-10-14 DIAGNOSIS — Z98891 History of uterine scar from previous surgery: Secondary | ICD-10-CM

## 2017-10-14 DIAGNOSIS — O34219 Maternal care for unspecified type scar from previous cesarean delivery: Secondary | ICD-10-CM

## 2017-10-14 LAB — POCT URINALYSIS DIPSTICK OB
Glucose, UA: NEGATIVE — AB
POC,PROTEIN,UA: NEGATIVE

## 2017-10-14 NOTE — Progress Notes (Signed)
Routine Prenatal Care Visit  Subjective  Jessica Mitchell is a 27 y.o. G2P0102 at 7012w4d being seen today for ongoing prenatal care.  She is currently monitored for the following issues for this high-risk pregnancy and has Depression; Anxiety; Supervision of high risk pregnancy, antepartum; and History of cesarean section on their problem list.  ----------------------------------------------------------------------------------- Patient reports nausea, vomiting and pressure in her chest after meals. Does not feel like her food moves out of her chest easily. She feels bloated. She is reluctant to make dietary changes that involve food preparation and it sounds like she eats out often. .   Contractions: Not present. Vag. Bleeding: None.  Movement: Absent. Denies leaking of fluid.  ----------------------------------------------------------------------------------- The following portions of the patient's history were reviewed and updated as appropriate: allergies, current medications, past family history, past medical history, past social history, past surgical history and problem list. Problem list updated.   Objective  Blood pressure 114/68, weight 113 lb (51.3 kg), last menstrual period 06/25/2017. Pregravid weight 107 lb (48.5 kg) Total Weight Gain 6 lb (2.722 kg) Urinalysis:      Fetal Status: Fetal Heart Rate (bpm): 161   Movement: Absent     General:  Alert, oriented and cooperative. Patient is in no acute distress.  Skin: Skin is warm and dry. No rash noted.   Cardiovascular: Normal heart rate noted  Respiratory: Normal respiratory effort, no problems with respiration noted  Abdomen: Soft, gravid, appropriate for gestational age. Pain/Pressure: Absent     Pelvic:  Cervical exam deferred        Extremities: Normal range of motion.     ental Status: Normal mood and affect. Normal behavior. Normal judgment and thought content.     Assessment   27 y.o. G2P0102 at 6312w4d by   04/10/2018, by Ultrasound presenting for routine prenatal visit  Plan   Pregnancy #2 Problems (from 08/10/17 to present)    Problem Noted Resolved   Supervision of high risk pregnancy, antepartum 08/10/2017 by Conard NovakJackson, Stephen D, MD No   Overview Addendum 09/27/2017 11:06 AM by Vena AustriaStaebler, Andreas, MD      Clinic [facility] Prenatal Labs  Dating 6 wk US Blood type: O/Negative/-- (06/19 1130)   Genetic Screen NIPS: normal XX   Antibody:Negative (06/19 1130)  Anatomic US  Rubella: 4.15 (06/19 1130)  Varicella: Immune  GTT  RPR: Non Reactive (06/19 1130)   Rhogam [ ]  28 weeks HBsAg: Negative (06/19 1130)   TDaP vaccine                       HIV: Non Reactive (06/19 1130)   Flu Shot                                GBS:   Contraception  Pap: 08/10/2017 NIL  CBB     CS/VBAC    Baby Food Breast   Support Person  Jaspers           History of cesarean section 08/10/2017 by Conard NovakJackson, Stephen D, MD No       Gestational age appropriate obstetric precautions including but not limited to vaginal bleeding, contractions, leaking of fluid and fetal movement were reviewed in detail with the patient.    Discussed plant based proteins, nuts, beans, tofu, tempeh, milk, boost, cheese. Discussed small frequent meals.  She will try dietary changes, if no improvement at next visit consider referral to  GI for evaluation.   Return in about 4 weeks (around 11/11/2017) for [name] and [place].  Adelene Idler MD [facility], [facility] 10/14/17 9:54 AM

## 2017-10-14 NOTE — Progress Notes (Signed)
[name]  N&V when eats meat

## 2017-10-21 ENCOUNTER — Other Ambulatory Visit (HOSPITAL_COMMUNITY)
Admission: RE | Admit: 2017-10-21 | Discharge: 2017-10-21 | Disposition: A | Discharge status: Home / Self Care | Payer: Medicaid Other | Source: Ambulatory Visit | Attending: Obstetrics and Gynecology | Admitting: Obstetrics and Gynecology

## 2017-10-21 ENCOUNTER — Ambulatory Visit (INDEPENDENT_AMBULATORY_CARE_PROVIDER_SITE_OTHER): Payer: Medicaid Other | Admitting: Obstetrics and Gynecology

## 2017-10-21 VITALS — BP 110/60 | Wt 115.0 lb

## 2017-10-21 DIAGNOSIS — N760 Acute vaginitis: Secondary | ICD-10-CM

## 2017-10-21 DIAGNOSIS — B3731 Acute candidiasis of vulva and vagina: Secondary | ICD-10-CM

## 2017-10-21 DIAGNOSIS — B373 Candidiasis of vulva and vagina: Secondary | ICD-10-CM

## 2017-10-21 MED ORDER — FLUCONAZOLE 150 MG PO TABS: 150.0000 mg | ORAL_TABLET | Freq: Once | ORAL | 0 refills | Status: AC

## 2017-10-21 NOTE — Progress Notes (Signed)
Work-in [name] Yeast infection/vaginal itching/discharge

## 2017-10-23 NOTE — Progress Notes (Signed)
Obstetrics & Gynecology Office Visit   Chief Complaint:  Chief Complaint  Patient presents with  . [name]    Yeast infection    History of Present Illness: Ms. Jessica Mitchell is a 27 y.o. [id number] who LMP was Patient's last menstrual period was 06/25/2017., presents today for a problem visit.   Patient complains of an abnormal vaginal discharge for 1 week. Discharge described as: white, thick and odorless. Vaginal symptoms include local irritation.   Other associated symptoms: local irritation.Menstrual pattern: N/A currently [redacted] week pregnant.  She denies recent antibiotic exposure, denies changes in soaps, detergents coinciding with the onset of her symptoms.  She has previously self treated or been under treatment by another provider for these symptoms. Was treated with terzole earlier in the pregnancy.    No fetal movement yet, no vaginal bleeding, no LOF, no contractions.  Review of Systems: Review of Systems  Constitutional: Negative.   Gastrointestinal: Negative.   Genitourinary: Positive for dysuria. Negative for flank pain, frequency, hematuria and urgency.  Skin: Positive for itching. Negative for rash.     Past Medical History:  Past Medical History:  Diagnosis Date  . Anxiety   . Depression   . Fatigue   . Mucous in stools   . Type 2 diabetes mellitus (HCC)    pt states she does not have diabetes  . Unintentional weight loss     Past Surgical History:  Past Surgical History:  Procedure Laterality Date  . CESAREAN SECTION    . FINGER SURGERY Right    2/2 trauma    Gynecologic History: Patient's last menstrual period was 06/25/2017.  Obstetric History: G2P0102  Family History:  No family history on file.  Social History:  Social History   Socioeconomic History  . Marital status: Single    Spouse name: Not on file  . Number of children: Not on file  . Years of education: Not on file  . Highest education level: Not on file  Occupational History    . Not on file  Social Needs  . Financial resource strain: Not on file  . Food insecurity:    Worry: Not on file    Inability: Not on file  . Transportation needs:    Medical: Not on file    Non-medical: Not on file  Tobacco Use  . Smoking status: Never Smoker  Substance and Sexual Activity  . Alcohol use: Yes  . Drug use: Yes    Types: Marijuana  . Sexual activity: Yes  Lifestyle  . Physical activity:    Days per week: Not on file    Minutes per session: Not on file  . Stress: Not on file  Relationships  . Social connections:    Talks on phone: Not on file    Gets together: Not on file    Attends religious service: Not on file    Active member of club or organization: Not on file    Attends meetings of clubs or organizations: Not on file    Relationship status: Not on file  . Intimate partner violence:    Fear of current or ex partner: Not on file    Emotionally abused: Not on file    Physically abused: Not on file    Forced sexual activity: Not on file  Other Topics Concern  . Not on file  Social History Narrative  . Not on file    Allergies:  No Known Allergies  Medications: Prior to  Admission medications   Medication Sig Start Date End Date Taking? Authorizing Provider  doxylamine, Sleep, (UNISOM) 25 MG tablet Take 1 tablet (25 mg total) by mouth at bedtime as needed (nausea and vomiting). 08/19/17   Natale Milch, MD  LORazepam (ATIVAN) 0.5 MG tablet take 1 tablet by mouth at bedtime if needed 11/20/15   [provider]  mirtazapine (REMERON) 15 MG tablet Take 15 mg by mouth at bedtime. 01/12/16   [provider]  naproxen (NAPROSYN) 500 MG tablet Take 1 tablet (500 mg total) by mouth 2 (two) times daily with a meal. 06/27/15   Joni Reining, PA-C  ondansetron (ZOFRAN ODT) 4 MG disintegrating tablet Take 1 tablet (4 mg total) by mouth every 6 (six) hours as needed for nausea. 09/27/17   Vena Austria, MD  pyridOXINE (VITAMIN B-6) 25  MG tablet Take 1 tablet (25 mg total) by mouth 4 (four) times daily. 08/19/17   Natale Milch, MD  sertraline (ZOLOFT) 50 MG tablet  01/12/16   [provider]    Physical Exam Vitals:  Vitals:   10/21/17 1639  BP: 110/60   Patient's last menstrual period was 06/25/2017.  General: NAD HEENT: normocephalic, anicteric Thyroid: no enlargement, no palpable nodules Pulmonary: No increased work of breathing Abdomen: NABS, soft, non-tender, non-distended.  Umbilicus without lesions.  Gravid uterus consistent with gestation age FHT 69 Genitourinary:  External: Normal external female genitalia.  Normal urethral meatus, normal  Bartholin's and Skene's glands.    Vagina: Normal vaginal mucosa, no evidence of prolapse.  Thick white discharge present Extremities: no edema, erythema, or tenderness Neurologic: Grossly intact Psychiatric: mood appropriate, affect full  Female chaperone present for pelvic  portions of the physical exam  Wet Prep: Clue Cells: Negative Fungal elements: Positive Trichomonas: Negative   Assessment: 27 y.o. G2P0102 at [redacted]w[redacted]d Presenting with vaginal yeast infection  Plan: Problem List Items Addressed This Visit    None    Visit Diagnoses    Acute vaginitis    -  Primary   Relevant Orders   Cervicovaginal ancillary only     1) Risk factors for bacterial vaginosis and candida infections discussed.  We discussed normal vaginal flora/microbiome.  Any factors that may alter the microbiome increase the risk of these opportunistic infections.  These include changes in pH, antibiotic exposures, diabetes, wet bathing suits etc.  We discussed that treatment is aimed at eradicating abnormal bacterial overgrowth and or yeast.  There may be some role for vaginal probiotics in restoring normal vaginal flora.   - Rx diflucan  2) A total of 15 minutes were spent in face-to-face contact with the patient during this encounter with over half of that time  devoted to counseling and coordination of care.  3) Return if symptoms worsen or fail to improve, otherwise keep previously scheduled [name].   Vena Austria, MD, Evern Core [facility], [facility] 10/23/2017, 7:21 AM

## 2017-10-26 LAB — CERVICOVAGINAL ANCILLARY ONLY
Bacterial vaginitis: NEGATIVE
Candida vaginitis: POSITIVE — AB
Chlamydia: NEGATIVE
Neisseria Gonorrhea: NEGATIVE
Trichomonas: NEGATIVE

## 2017-11-11 ENCOUNTER — Ambulatory Visit (INDEPENDENT_AMBULATORY_CARE_PROVIDER_SITE_OTHER): Payer: Medicaid Other

## 2017-11-11 ENCOUNTER — Encounter: Payer: Self-pay | Admitting: Obstetrics and Gynecology

## 2017-11-11 ENCOUNTER — Ambulatory Visit (INDEPENDENT_AMBULATORY_CARE_PROVIDER_SITE_OTHER): Payer: Medicaid Other | Admitting: Obstetrics and Gynecology

## 2017-11-11 VITALS — BP 104/58 | Wt 117.0 lb

## 2017-11-11 DIAGNOSIS — Z363 Encounter for antenatal screening for malformations: Secondary | ICD-10-CM

## 2017-11-11 DIAGNOSIS — Z23 Encounter for immunization: Secondary | ICD-10-CM

## 2017-11-11 DIAGNOSIS — O0990 Supervision of high risk pregnancy, unspecified, unspecified trimester: Secondary | ICD-10-CM

## 2017-11-11 DIAGNOSIS — O34219 Maternal care for unspecified type scar from previous cesarean delivery: Secondary | ICD-10-CM

## 2017-11-11 DIAGNOSIS — Z3A18 18 weeks gestation of pregnancy: Secondary | ICD-10-CM

## 2017-11-11 DIAGNOSIS — Z98891 History of uterine scar from previous surgery: Secondary | ICD-10-CM

## 2017-11-11 NOTE — Progress Notes (Signed)
[name] No concerns  Flu shot today  

## 2017-11-11 NOTE — Progress Notes (Signed)
    Routine Prenatal Care Visit  Subjective  Jessica Mitchell is a 27 y.o. G2P0102 at 692w4d being seen today for ongoing prenatal care.  She is currently monitored for the following issues for this high-risk pregnancy and has Depression; Anxiety; Supervision of high risk pregnancy, antepartum; and History of cesarean section on their problem list.  ----------------------------------------------------------------------------------- Patient reports no complaints.   Contractions: Not present. Vag. Bleeding: None.  Movement: Present. Denies leaking of fluid.  ----------------------------------------------------------------------------------- The following portions of the patient's history were reviewed and updated as appropriate: allergies, current medications, past family history, past medical history, past social history, past surgical history and problem list. Problem list updated.   Objective  Blood pressure (!) 104/58, weight 117 lb (53.1 kg), last menstrual period 06/25/2017. Pregravid weight 107 lb (48.5 kg) Total Weight Gain 10 lb (4.536 kg) Urinalysis:      Fetal Status: Fetal Heart Rate (bpm): 150 Fundal Height: 20 cm Movement: Present     General:  Alert, oriented and cooperative. Patient is in no acute distress.  Skin: Skin is warm and dry. No rash noted.   Cardiovascular: Normal heart rate noted  Respiratory: Normal respiratory effort, no problems with respiration noted  Abdomen: Soft, gravid, appropriate for gestational age. Pain/Pressure: Absent     Pelvic:  Cervical exam deferred        Extremities: Normal range of motion.  Edema: None  ental Status: Normal mood and affect. Normal behavior. Normal judgment and thought content.     Assessment   27 y.o. G2P0102 at 722w4d by  04/10/2018, by Ultrasound presenting for routine prenatal visit  Plan   Pregnancy #2 Problems (from 08/10/17 to present)    Problem Noted Resolved   Supervision of high risk pregnancy, antepartum  08/10/2017 by Conard NovakJackson, Stephen D, MD No   Overview Addendum 11/11/2017 10:02 AM by Natale MilchSchuman, Lidya Mccalister R, MD      [facility]inic [facility] Prenatal Labs  Dating 6 wk US Blood type: O/Negative/-- (06/19 1130)   Genetic Screen NIPS: normal XY   Antibody:Negative (06/19 1130)  Anatomic US complete Rubella: 4.15 (06/19 1130)  Varicella: Immune  GTT  RPR: Non Reactive (06/19 1130)   Rhogam [ ]  28 weeks HBsAg: Negative (06/19 1130)   TDaP vaccine                       HIV: Non Reactive (06/19 1130)   Flu Shot   11/11/17                             GBS:   Contraception  None Pap: 08/10/2017 NIL  CBB     CS/VBAC  desires VBAC [ ] counseling   Baby Food Breast   Support Person  Karleen HampshireSpencer           History of cesarean section 08/10/2017 by Conard NovakJackson, Stephen D, MD No       Gestational age appropriate obstetric precautions including but not limited to vaginal bleeding, contractions, leaking of fluid and fetal movement were reviewed in detail with the patient.    Given information on prenatal classes with [facility].  Discussed breast feeding. Patient is planning on breast feeding. Patient was given resources and lactation consultation was advised.  Briefly discussed VBAC.   Return in about 3 weeks (around 12/02/2017) for [name].  Adelene Idlerhristanna Garlon Tuggle MD [facility], [facility] 11/11/17 10:02 AM

## 2017-11-22 ENCOUNTER — Encounter: Payer: Self-pay | Admitting: Advanced Practice Midwife

## 2017-11-22 ENCOUNTER — Ambulatory Visit (INDEPENDENT_AMBULATORY_CARE_PROVIDER_SITE_OTHER): Payer: Medicaid Other | Admitting: Advanced Practice Midwife

## 2017-11-22 VITALS — BP 128/60 | Wt 119.0 lb

## 2017-11-22 DIAGNOSIS — O368120 Decreased fetal movements, second trimester, not applicable or unspecified: Secondary | ICD-10-CM

## 2017-11-22 DIAGNOSIS — O34219 Maternal care for unspecified type scar from previous cesarean delivery: Secondary | ICD-10-CM

## 2017-11-22 DIAGNOSIS — Z3A20 20 weeks gestation of pregnancy: Secondary | ICD-10-CM

## 2017-11-22 LAB — POCT URINALYSIS DIPSTICK OB: Glucose, UA: NEGATIVE

## 2017-11-22 NOTE — Progress Notes (Signed)
  Routine Prenatal Care Visit  Subjective  Jessica Mitchell is a 27 y.o. G2P0102 at [redacted]w[redacted]d being seen today for ongoing prenatal care.  She is currently monitored for the following issues for this high-risk pregnancy and has Depression; Anxiety; Supervision of high risk pregnancy, antepartum; and History of cesarean section on their problem list.  ----------------------------------------------------------------------------------- Patient reports having diarrhea for the past 2 days causing general abdominal cramping. She was unable to eat or drink very much yesterday. Normally she feels the baby move at night and first thing in the morning. She has not felt the baby move since last night. .   Contractions: Not present. Vag. Bleeding: None.  Movement: (!) Decreased. Denies leaking of fluid.  ----------------------------------------------------------------------------------- The following portions of the patient's history were reviewed and updated as appropriate: allergies, current medications, past family history, past medical history, past social history, past surgical history and problem list. Problem list updated.   Objective  Blood pressure 128/60, weight 119 lb (54 kg), last menstrual period 06/25/2017. Pregravid weight 107 lb (48.5 kg) Total Weight Gain 12 lb (5.443 kg) Urinalysis: Urine Protein Trace  Urine Glucose Negative  Fetal Status: Fetal Heart Rate (bpm): 152   Movement: (!) Decreased     General:  Alert, oriented and cooperative. Patient is in no acute distress.  Skin: Skin is warm and dry. No rash noted.   Cardiovascular: Normal heart rate noted  Respiratory: Normal respiratory effort, no problems with respiration noted  Abdomen: Soft, gravid, appropriate for gestational age. Pain/Pressure: Absent     Pelvic:  Cervical exam deferred        Extremities: Normal range of motion.  Edema: None  Mental Status: Normal mood and affect. Normal behavior. Normal judgment and thought  content.   Assessment   27 y.o. G2P0102 at [redacted]w[redacted]d by  04/10/2018, by Ultrasound presenting for work-in prenatal visit  Plan   Pregnancy #2 Problems (from 08/10/17 to present)    Problem Noted Resolved   Supervision of high risk pregnancy, antepartum 08/10/2017 by Conard Novak, MD No   Overview Addendum 11/11/2017 10:02 AM by Natale Milch, MD      [facility] Prenatal Labs  Dating 6 wk [place] Blood type: O/Negative/-- (06/19 1130)   Genetic Screen NIPS: normal XY   Antibody:Negative (06/19 1130)  Anatomic [place] complete Rubella: 4.15 (06/19 1130)  Varicella: Immune  GTT  RPR: Non Reactive (06/19 1130)   Rhogam [ ]  28 weeks HBsAg: Negative (06/19 1130)   TDaP vaccine                       HIV: Non Reactive (06/19 1130)   Flu Shot   11/11/17                             GBS:   Contraception  None Pap: 08/10/2017 NIL  CBB     CS/VBAC  desires VBAC [ ] counseling   Baby Food Breast   Support Person  [place]           History of cesarean section 08/10/2017 by Conard Novak, MD No       Preterm labor symptoms and general obstetric precautions including but not limited to vaginal bleeding, contractions, leaking of fluid and fetal movement were reviewed in detail with the patient.    Follow up visit is already scheduled  Tresea Mall, CNM 11/22/2017 11:58 AM

## 2017-11-22 NOTE — Progress Notes (Signed)
[name] Decrease fetal movement diarrhea

## 2017-12-02 ENCOUNTER — Ambulatory Visit (INDEPENDENT_AMBULATORY_CARE_PROVIDER_SITE_OTHER): Payer: Medicaid Other | Admitting: Obstetrics and Gynecology

## 2017-12-02 ENCOUNTER — Encounter: Payer: Self-pay | Admitting: Obstetrics and Gynecology

## 2017-12-02 VITALS — BP 118/74 | Wt 122.0 lb

## 2017-12-02 DIAGNOSIS — Z3A21 21 weeks gestation of pregnancy: Secondary | ICD-10-CM

## 2017-12-02 DIAGNOSIS — O0990 Supervision of high risk pregnancy, unspecified, unspecified trimester: Secondary | ICD-10-CM

## 2017-12-02 DIAGNOSIS — O34219 Maternal care for unspecified type scar from previous cesarean delivery: Secondary | ICD-10-CM

## 2017-12-02 DIAGNOSIS — Z98891 History of uterine scar from previous surgery: Secondary | ICD-10-CM

## 2017-12-02 NOTE — Progress Notes (Signed)
  Routine Prenatal Care Visit  Subjective  Jessica Mitchell is a 27 y.o. G2P0102 at [redacted]w[redacted]d being seen today for ongoing prenatal care.  She is currently monitored for the following issues for this high-risk pregnancy and has Depression; Anxiety; Supervision of high risk pregnancy, antepartum; and History of cesarean section on their problem list.  ----------------------------------------------------------------------------------- Patient reports no complaints.   Contractions: Not present. Vag. Bleeding: None.  Movement: Present. Denies leaking of fluid.  ----------------------------------------------------------------------------------- The following portions of the patient's history were reviewed and updated as appropriate: allergies, current medications, past family history, past medical history, past social history, past surgical history and problem list. Problem list updated.   Objective  Blood pressure 118/74, weight 122 lb (55.3 kg), last menstrual period 06/25/2017. Pregravid weight 107 lb (48.5 kg) Total Weight Gain 15 lb (6.804 kg) Urinalysis: Urine Protein    Urine Glucose    Fetal Status: Fetal Heart Rate (bpm): 150   Movement: Present     General:  Alert, oriented and cooperative. Patient is in no acute distress.  Skin: Skin is warm and dry. No rash noted.   Cardiovascular: Normal heart rate noted  Respiratory: Normal respiratory effort, no problems with respiration noted  Abdomen: Soft, gravid, appropriate for gestational age. Pain/Pressure: Absent     Pelvic:  Cervical exam deferred        Extremities: Normal range of motion.  Edema: None  Mental Status: Normal mood and affect. Normal behavior. Normal judgment and thought content.   Assessment   27 y.o. G2P0102 at [redacted]w[redacted]d by  04/10/2018, by Ultrasound presenting for routine prenatal visit  Plan   Pregnancy #2 Problems (from 08/10/17 to present)    Problem Noted Resolved   Supervision of high risk pregnancy, antepartum  08/10/2017 by Conard Novak, MD No   Overview Addendum 11/11/2017 10:02 AM by Natale Milch, MD      [facility] Prenatal Labs  Dating 6 wk [place] Blood type: O/Negative/-- (06/19 1130)   Genetic Screen NIPS: normal XY   Antibody:Negative (06/19 1130)  Anatomic [place] complete Rubella: 4.15 (06/19 1130)  Varicella: Immune  GTT  RPR: Non Reactive (06/19 1130)   Rhogam [ ]  28 weeks HBsAg: Negative (06/19 1130)   TDaP vaccine                       HIV: Non Reactive (06/19 1130)   Flu Shot   11/11/17                             GBS:   Contraception  None Pap: 08/10/2017 NIL  CBB     CS/VBAC  desires VBAC [ ] counseling   Baby Food Breast   Support Person  [place]        History of cesarean section 08/10/2017 by Conard Novak, MD No       Preterm labor symptoms and general obstetric precautions including but not limited to vaginal bleeding, contractions, leaking of fluid and fetal movement were reviewed in detail with the patient. Please refer to After Visit Summary for other counseling recommendations.   Return in about 4 weeks (around 12/30/2017) for Routine Prenatal Appointment.  Thomasene Mohair, MD, [facility], [facility] 12/02/2017 8:35 AM

## 2017-12-30 ENCOUNTER — Ambulatory Visit (INDEPENDENT_AMBULATORY_CARE_PROVIDER_SITE_OTHER): Payer: Medicaid Other | Admitting: Maternal Newborn

## 2017-12-30 ENCOUNTER — Encounter: Payer: Self-pay | Admitting: Maternal Newborn

## 2017-12-30 VITALS — BP 116/60 | Wt 129.0 lb

## 2017-12-30 DIAGNOSIS — Z3A25 25 weeks gestation of pregnancy: Secondary | ICD-10-CM

## 2017-12-30 DIAGNOSIS — O0990 Supervision of high risk pregnancy, unspecified, unspecified trimester: Secondary | ICD-10-CM

## 2017-12-30 DIAGNOSIS — O34219 Maternal care for unspecified type scar from previous cesarean delivery: Secondary | ICD-10-CM

## 2017-12-30 LAB — POCT URINALYSIS DIPSTICK OB
Glucose, UA: NEGATIVE
POC,PROTEIN,UA: NEGATIVE

## 2017-12-30 NOTE — Progress Notes (Signed)
[name]- lower back pressure that ends at butt per pt x 2 weeks ago, feels heavy/pressure on lower stomach

## 2017-12-30 NOTE — Patient Instructions (Signed)

## 2017-12-30 NOTE — Progress Notes (Signed)
    Routine Prenatal Care Visit  Subjective  Jessica Mitchell is a 27 y.o. G2P0102 at [redacted]w[redacted]d being seen today for ongoing prenatal care.  She is currently monitored for the following issues for this high-risk pregnancy and has Depression; Anxiety; Supervision of high risk pregnancy, antepartum; and History of cesarean section on their problem list.  ----------------------------------------------------------------------------------- Patient reports lower back pain and round ligament pain, increasing pressure from the baby, and occasional Braxton-Hicks contractions  Contractions: Not present. Vag. Bleeding: None.  Movement: Present. No leaking of fluid.  ----------------------------------------------------------------------------------- The following portions of the patient's history were reviewed and updated as appropriate: allergies, current medications, past family history, past medical history, past social history, past surgical history and problem list. Problem list updated.  Objective  Blood pressure 116/60, weight 129 lb (58.5 kg), last menstrual period 06/25/2017. Pregravid weight 107 lb (48.5 kg) Total Weight Gain 22 lb (9.979 kg) Body mass index is 22.85 kg/m.   Urinalysis: Protein Negative, Glucose Negative  Fetal Status: Fetal Heart Rate (bpm): 144 Fundal Height: 25 cm Movement: Present     General:  Alert, oriented and cooperative. Patient is in no acute distress.  Skin: Skin is warm and dry. No rash noted.   Cardiovascular: Normal heart rate noted  Respiratory: Normal respiratory effort, no problems with respiration noted  Abdomen: Soft, gravid, appropriate for gestational age. Pain/Pressure: Present     Pelvic:  Cervical exam deferred        Extremities: Normal range of motion.  Edema: None  Mental Status: Normal mood and affect. Normal behavior. Normal judgment and thought content.    Assessment   26 y.o. [id number] at [redacted]w[redacted]d, EDD 04/10/2018 by Ultrasound presenting for a  routine prenatal visit.  Plan   Pregnancy #2 Problems (from 08/10/17 to present)    Problem Noted Resolved   Supervision of high risk pregnancy, antepartum 08/10/2017 by Conard Novak, MD No   Overview Addendum 11/11/2017 10:02 AM by Natale Milch, MD      [facility] Prenatal Labs  Dating 6 wk [place] Blood type: O/Negative/-- (06/19 1130)   Genetic Screen NIPS: normal XY   Antibody:Negative (06/19 1130)  Anatomic [place] complete Rubella: 4.15 (06/19 1130)  Varicella: Immune  GTT  RPR: Non Reactive (06/19 1130)   Rhogam [ ]  28 weeks HBsAg: Negative (06/19 1130)   TDaP vaccine                       HIV: Non Reactive (06/19 1130)   Flu Shot   11/11/17                             GBS:   Contraception  None Pap: 08/10/2017 NIL  CBB     CS/VBAC  desires VBAC [ ] counseling   Baby Food Breast   Support Person  [place]           History of cesarean section 08/10/2017 by Conard Novak, MD No    Discussed comfort measures for musculoskeletal/round ligament pain such as maternity support belt, heating pad, etc.  GTT/28 week labs/RhoGAM next visit.   Return in about 3 weeks (around 01/20/2018) for [name] and 28 week labs/GTT.  Marcelyn Bruins, CNM 12/30/2017  8:30 AM

## 2018-01-18 ENCOUNTER — Ambulatory Visit (INDEPENDENT_AMBULATORY_CARE_PROVIDER_SITE_OTHER): Payer: Medicaid Other | Admitting: Obstetrics & Gynecology

## 2018-01-18 ENCOUNTER — Other Ambulatory Visit: Payer: Medicaid Other

## 2018-01-18 ENCOUNTER — Other Ambulatory Visit: Payer: Self-pay | Admitting: Maternal Newborn

## 2018-01-18 VITALS — BP 110/60 | Wt 132.0 lb

## 2018-01-18 DIAGNOSIS — Z98891 History of uterine scar from previous surgery: Secondary | ICD-10-CM

## 2018-01-18 DIAGNOSIS — Z3A28 28 weeks gestation of pregnancy: Secondary | ICD-10-CM

## 2018-01-18 DIAGNOSIS — Z131 Encounter for screening for diabetes mellitus: Secondary | ICD-10-CM

## 2018-01-18 DIAGNOSIS — O0990 Supervision of high risk pregnancy, unspecified, unspecified trimester: Secondary | ICD-10-CM

## 2018-01-18 DIAGNOSIS — O34219 Maternal care for unspecified type scar from previous cesarean delivery: Secondary | ICD-10-CM

## 2018-01-18 LAB — POCT URINALYSIS DIPSTICK OB
Glucose, UA: NEGATIVE
POC,PROTEIN,UA: NEGATIVE

## 2018-01-18 NOTE — Addendum Note (Signed)
Addended by: Cornelius MorasPATTERSON, Jamise Pentland D on: 01/18/2018 09:13 AM   Modules accepted: Orders

## 2018-01-18 NOTE — Patient Instructions (Signed)
Third Trimester of Pregnancy The third trimester is from week 28 through week 40 (months 7 through 9). The third trimester is a time when the unborn baby (fetus) is growing rapidly. At the end of the ninth month, the fetus is about 20 inches in length and weighs 6-10 pounds. Body changes during your third trimester Your body will continue to go through many changes during pregnancy. The changes vary from woman to woman. During the third trimester:  Your weight will continue to increase. You can expect to gain 25-35 pounds (11-16 kg) by the end of the pregnancy.  You may begin to get stretch marks on your hips, abdomen, and breasts.  You may urinate more often because the fetus is moving lower into your pelvis and pressing on your bladder.  You may develop or continue to have heartburn. This is caused by increased hormones that slow down muscles in the digestive tract.  You may develop or continue to have constipation because increased hormones slow digestion and cause the muscles that push waste through your intestines to relax.  You may develop hemorrhoids. These are swollen veins (varicose veins) in the rectum that can itch or be painful.  You may develop swollen, bulging veins (varicose veins) in your legs.  You may have increased body aches in the pelvis, back, or thighs. This is due to weight gain and increased hormones that are relaxing your joints.  You may have changes in your hair. These can include thickening of your hair, rapid growth, and changes in texture. Some women also have hair loss during or after pregnancy, or hair that feels dry or thin. Your hair will most likely return to normal after your baby is born.  Your breasts will continue to grow and they will continue to become tender. A yellow fluid (colostrum) may leak from your breasts. This is the first milk you are producing for your baby.  Your belly button may stick out.  You may notice more swelling in your hands,  face, or ankles.  You may have increased tingling or numbness in your hands, arms, and legs. The skin on your belly may also feel numb.  You may feel short of breath because of your expanding uterus.  You may have more problems sleeping. This can be caused by the size of your belly, increased need to urinate, and an increase in your body's metabolism.  You may notice the fetus "dropping," or moving lower in your abdomen (lightening).  You may have increased vaginal discharge.  You may notice your joints feel loose and you may have pain around your pelvic bone.  What to expect at prenatal visits You will have prenatal exams every 2 weeks until week 36. Then you will have weekly prenatal exams. During a routine prenatal visit:  You will be weighed to make sure you and the baby are growing normally.  Your blood pressure will be taken.  Your abdomen will be measured to track your baby's growth.  The fetal heartbeat will be listened to.  Any test results from the previous visit will be discussed.  You may have a cervical check near your due date to see if your cervix has softened or thinned (effaced).  You will be tested for Group B streptococcus. This happens between 35 and 37 weeks.  Your health care provider may ask you:  What your birth plan is.  How you are feeling.  If you are feeling the baby move.  If you have had   any abnormal symptoms, such as leaking fluid, bleeding, severe headaches, or abdominal cramping.  If you are using any tobacco products, including cigarettes, chewing tobacco, and electronic cigarettes.  If you have any questions.  Other tests or screenings that may be performed during your third trimester include:  Blood tests that check for low iron levels (anemia).  Fetal testing to check the health, activity level, and growth of the fetus. Testing is done if you have certain medical conditions or if there are problems during the  pregnancy.  Nonstress test (NST). This test checks the health of your baby to make sure there are no signs of problems, such as the baby not getting enough oxygen. During this test, a belt is placed around your belly. The baby is made to move, and its heart rate is monitored during movement.  What is false labor? False labor is a condition in which you feel small, irregular tightenings of the muscles in the womb (contractions) that usually go away with rest, changing position, or drinking water. These are called Braxton Hicks contractions. Contractions may last for hours, days, or even weeks before true labor sets in. If contractions come at regular intervals, become more frequent, increase in intensity, or become painful, you should see your health care provider. What are the signs of labor?  Abdominal cramps.  Regular contractions that start at 10 minutes apart and become stronger and more frequent with time.  Contractions that start on the top of the uterus and spread down to the lower abdomen and back.  Increased pelvic pressure and dull back pain.  A watery or bloody mucus discharge that comes from the vagina.  Leaking of amniotic fluid. This is also known as your "water breaking." It could be a slow trickle or a gush. Let your health care provider know if it has a color or strange odor. If you have any of these signs, call your health care provider right away, even if it is before your due date. Follow these instructions at home: Medicines  Follow your health care provider's instructions regarding medicine use. Specific medicines may be either safe or unsafe to take during pregnancy.  Take a prenatal vitamin that contains at least 600 micrograms (mcg) of folic acid.  If you develop constipation, try taking a stool softener if your health care provider approves. Eating and drinking  Eat a balanced diet that includes fresh fruits and vegetables, whole grains, good sources of protein  such as meat, eggs, or tofu, and low-fat dairy. Your health care provider will help you determine the amount of weight gain that is right for you.  Avoid raw meat and uncooked cheese. These carry germs that can cause birth defects in the baby.  If you have low calcium intake from food, talk to your health care provider about whether you should take a daily calcium supplement.  Eat four or five small meals rather than three large meals a day.  Limit foods that are high in fat and processed sugars, such as fried and sweet foods.  To prevent constipation: ? Drink enough fluid to keep your urine clear or pale yellow. ? Eat foods that are high in fiber, such as fresh fruits and vegetables, whole grains, and beans. Activity  Exercise only as directed by your health care provider. Most women can continue their usual exercise routine during pregnancy. Try to exercise for 30 minutes at least 5 days a week. Stop exercising if you experience uterine contractions.  Avoid heavy   lifting.  Do not exercise in extreme heat or humidity, or at high altitudes.  Wear low-heel, comfortable shoes.  Practice good posture.  You may continue to have sex unless your health care provider tells you otherwise. Relieving pain and discomfort  Take frequent breaks and rest with your legs elevated if you have leg cramps or low back pain.  Take warm sitz baths to soothe any pain or discomfort caused by hemorrhoids. Use hemorrhoid cream if your health care provider approves.  Wear a good support bra to prevent discomfort from breast tenderness.  If you develop varicose veins: ? Wear support pantyhose or compression stockings as told by your healthcare provider. ? Elevate your feet for 15 minutes, 3-4 times a day. Prenatal care  Write down your questions. Take them to your prenatal visits.  Keep all your prenatal visits as told by your health care provider. This is important. Safety  Wear your seat belt at  all times when driving.  Make a list of emergency phone numbers, including numbers for family, friends, the hospital, and police and fire departments. General instructions  Avoid cat litter boxes and soil used by cats. These carry germs that can cause birth defects in the baby. If you have a cat, ask someone to clean the litter box for you.  Do not travel far distances unless it is absolutely necessary and only with the approval of your health care provider.  Do not use hot tubs, steam rooms, or saunas.  Do not drink alcohol.  Do not use any products that contain nicotine or tobacco, such as cigarettes and e-cigarettes. If you need help quitting, ask your health care provider.  Do not use any medicinal herbs or unprescribed drugs. These chemicals affect the formation and growth of the baby.  Do not douche or use tampons or scented sanitary pads.  Do not cross your legs for long periods of time.  To prepare for the arrival of your baby: ? Take prenatal classes to understand, practice, and ask questions about labor and delivery. ? Make a trial run to the hospital. ? Visit the hospital and tour the maternity area. ? Arrange for maternity or paternity leave through employers. ? Arrange for family and friends to take care of pets while you are in the hospital. ? Purchase a rear-facing car seat and make sure you know how to install it in your car. ? Pack your hospital bag. ? Prepare the baby's nursery. Make sure to remove all pillows and stuffed animals from the baby's crib to prevent suffocation.  Visit your dentist if you have not gone during your pregnancy. Use a soft toothbrush to brush your teeth and be gentle when you floss. Contact a health care provider if:  You are unsure if you are in labor or if your water has broken.  You become dizzy.  You have mild pelvic cramps, pelvic pressure, or nagging pain in your abdominal area.  You have lower back pain.  You have persistent  nausea, vomiting, or diarrhea.  You have an unusual or bad smelling vaginal discharge.  You have pain when you urinate. Get help right away if:  Your water breaks before 37 weeks.  You have regular contractions less than 5 minutes apart before 37 weeks.  You have a fever.  You are leaking fluid from your vagina.  You have spotting or bleeding from your vagina.  You have severe abdominal pain or cramping.  You have rapid weight loss or weight gain.    You have shortness of breath with chest pain.  You notice sudden or extreme swelling of your face, hands, ankles, feet, or legs.  Your baby makes fewer than 10 movements in 2 hours.  You have severe headaches that do not go away when you take medicine.  You have vision changes. Summary  The third trimester is from week 28 through week 40, months 7 through 9. The third trimester is a time when the unborn baby (fetus) is growing rapidly.  During the third trimester, your discomfort may increase as you and your baby continue to gain weight. You may have abdominal, leg, and back pain, sleeping problems, and an increased need to urinate.  During the third trimester your breasts will keep growing and they will continue to become tender. A yellow fluid (colostrum) may leak from your breasts. This is the first milk you are producing for your baby.  False labor is a condition in which you feel small, irregular tightenings of the muscles in the womb (contractions) that eventually go away. These are called Braxton Hicks contractions. Contractions may last for hours, days, or even weeks before true labor sets in.  Signs of labor can include: abdominal cramps; regular contractions that start at 10 minutes apart and become stronger and more frequent with time; watery or bloody mucus discharge that comes from the vagina; increased pelvic pressure and dull back pain; and leaking of amniotic fluid. This information is not intended to replace advice  given to you by your health care provider. Make sure you discuss any questions you have with your health care provider. Document Released: 02/02/2001 Document Revised: 07/17/2015 Document Reviewed: 04/11/2012 Elsevier Interactive Patient Education  2017 [employer]  

## 2018-01-18 NOTE — Progress Notes (Signed)
Subjective  Fetal Movement? yes Contractions? Yes- B-H's at intermittent basis Leaking Fluid? no Vaginal Bleeding? no  Objective  BP 110/60   Wt 132 lb (59.9 kg)   LMP 06/25/2017   BMI 23.38 kg/m  General: NAD Pumonary: no increased work of breathing Abdomen: gravid, non-tender Extremities: no edema Psychiatric: mood appropriate, affect full  Assessment  27 y.o. G2P0102 at [redacted]w[redacted]d by  04/10/2018, by Ultrasound presenting for routine prenatal visit  Plan   Problem List Items Addressed This Visit      Other   Supervision of high risk pregnancy, antepartum   Relevant Orders   US OB Follow Up   History of cesarean section    Other Visit Diagnoses    [redacted] weeks gestation of pregnancy    -  Primary   Screening for diabetes mellitus        OB/GYN  Counseling Note  27 y.o. [id number] at [redacted]w[redacted]d with Estimated Date of Delivery: 04/10/18 was seen today in office to discuss trial of labor after cesarean section (TOLAC) versus elective repeat cesarean delivery (ERCD). The following risks were discussed with the patient.  Prior CS for Twins 10 years ago.  Risk of uterine rupture at term is 0.78 percent with TOLAC and 0.22 percent with ERCD. 1 in 10 uterine ruptures will result in neonatal death or neurological injury. The benefits of a trial of labor after cesarean (TOLAC) resulting in a vaginal birth after cesarean (VBAC) include the following: shorter length of hospital stay and postpartum recovery (in most cases); fewer complications, such as postpartum fever, wound or uterine infection, thromboembolism (blood clots in the leg or lung), need for blood transfusion and fewer neonatal breathing problems.  The risks of an attempted VBAC or TOLAC include the following: Risk of failed trial of labor after cesarean (TOLAC) without a vaginal birth after cesarean (VBAC) resulting in repeat cesarean delivery (RCD) in about 20 to 40 percent of women who attempt VBAC.  Risk of rupture of uterus  resulting in an emergency cesarean delivery. The risk of uterine rupture may be related in part to the type of uterine incision made during the first cesarean delivery. A previous transverse uterine incision has the lowest risk of rupture (0.2 to 1.5 percent risk). Vertical or T-shaped uterine incisions have a higher risk of uterine rupture (4 to 9 percent risk)The risk of fetal death is very low with both VBAC and elective repeat cesarean delivery (ERCD), but the likelihood of fetal death is higher with VBAC than with ERCD. Maternal death is very rare with either type of delivery.  The risks of an elective repeat cesarean delivery (ERCD) were reviewed with the patient including but not limited to: 03/998 risk of uterine rupture which could have serious consequences, bleeding which may require transfusion; infection which may require antibiotics; injury to bowel, bladder or other surrounding organs (bowel, bladder, ureters); injury to the fetus; need for additional procedures including hysterectomy in the event of a life-threatening hemorrhage; thromboembolic phenomenon; abnormal placentation; incisional problems; death and other postoperative or anesthesia complications.    Works two jobs, so monitor for [employer] and pains she has, may need to cut back [place] nv to assess cervix and growth.  She has concerns as to PTL and carrying pregnancy low. Counseled as to the low liklihood of [place] picking up on any signs of PTL as compared to sx's.  ALso may need to cut back on working so much.  Annamarie Major, MD, [facility], [facility] 01/18/2018  9:08 AM

## 2018-01-19 LAB — 28 WEEKS RH-PANEL
Antibody Screen: NEGATIVE
Basophils Absolute: 0.1 10*3/uL (ref 0.0–0.2)
Basos: 1 %
EOS (ABSOLUTE): 0.5 10*3/uL — ABNORMAL HIGH (ref 0.0–0.4)
Eos: 3 %
Gestational Diabetes Screen: 104 mg/dL (ref 65–139)
HIV Screen 4th Generation wRfx: NONREACTIVE
Hematocrit: 34.2 % (ref 34.0–46.6)
Hemoglobin: 11.7 g/dL (ref 11.1–15.9)
Immature Grans (Abs): 0.2 10*3/uL — ABNORMAL HIGH (ref 0.0–0.1)
Immature Granulocytes: 1 %
Lymphocytes Absolute: 1.9 10*3/uL (ref 0.7–3.1)
Lymphs: 13 %
MCH: 30.2 pg (ref 26.6–33.0)
MCHC: 34.2 g/dL (ref 31.5–35.7)
MCV: 88 fL (ref 79–97)
Monocytes Absolute: 0.8 10*3/uL (ref 0.1–0.9)
Monocytes: 6 %
Neutrophils Absolute: 11.1 10*3/uL — ABNORMAL HIGH (ref 1.4–7.0)
Neutrophils: 76 %
Platelets: 163 10*3/uL (ref 150–450)
RBC: 3.87 x10E6/uL (ref 3.77–5.28)
RDW: 12 % — ABNORMAL LOW (ref 12.3–15.4)
RPR Ser Ql: NONREACTIVE
WBC: 14.6 10*3/uL — ABNORMAL HIGH (ref 3.4–10.8)

## 2018-01-24 ENCOUNTER — Telehealth: Payer: Self-pay

## 2018-01-24 ENCOUNTER — Observation Stay
Admission: EM | Admit: 2018-01-24 | Discharge: 2018-01-24 | Disposition: A | Discharge status: Home / Self Care | Payer: Medicaid Other | Attending: Obstetrics & Gynecology | Admitting: Obstetrics & Gynecology

## 2018-01-24 ENCOUNTER — Other Ambulatory Visit: Payer: Self-pay

## 2018-01-24 DIAGNOSIS — R109 Unspecified abdominal pain: Secondary | ICD-10-CM | POA: Insufficient documentation

## 2018-01-24 DIAGNOSIS — Z98891 History of uterine scar from previous surgery: Secondary | ICD-10-CM

## 2018-01-24 DIAGNOSIS — O26893 Other specified pregnancy related conditions, third trimester: Secondary | ICD-10-CM

## 2018-01-24 DIAGNOSIS — O99343 Other mental disorders complicating pregnancy, third trimester: Secondary | ICD-10-CM | POA: Diagnosis not present

## 2018-01-24 DIAGNOSIS — Z3A29 29 weeks gestation of pregnancy: Secondary | ICD-10-CM

## 2018-01-24 DIAGNOSIS — O99891 Other specified diseases and conditions complicating pregnancy: Secondary | ICD-10-CM

## 2018-01-24 DIAGNOSIS — R1030 Lower abdominal pain, unspecified: Secondary | ICD-10-CM

## 2018-01-24 DIAGNOSIS — O9989 Other specified diseases and conditions complicating pregnancy, childbirth and the puerperium: Secondary | ICD-10-CM

## 2018-01-24 DIAGNOSIS — O26899 Other specified pregnancy related conditions, unspecified trimester: Secondary | ICD-10-CM

## 2018-01-24 LAB — URINALYSIS, ROUTINE W REFLEX MICROSCOPIC
Bilirubin Urine: NEGATIVE
Glucose, UA: NEGATIVE mg/dL
Hgb urine dipstick: NEGATIVE
Ketones, ur: NEGATIVE mg/dL
Leukocytes, UA: NEGATIVE
Nitrite: NEGATIVE
Protein, ur: NEGATIVE mg/dL
Specific Gravity, Urine: 1.004 — ABNORMAL LOW (ref 1.005–1.030)
pH: 7 (ref 5.0–8.0)

## 2018-01-24 LAB — FETAL FIBRONECTIN: Fetal Fibronectin: NEGATIVE

## 2018-01-24 MED ORDER — ONDANSETRON HCL 4 MG/2ML IJ SOLN: 4.0000 mg | Freq: Four times a day (QID) | INTRAMUSCULAR | Status: DC | PRN

## 2018-01-24 MED ORDER — LACTATED RINGERS IV SOLN: INTRAVENOUS | Status: DC

## 2018-01-24 MED ORDER — LACTATED RINGERS IV SOLN: 500.0000 mL | INTRAVENOUS | Status: DC | PRN

## 2018-01-24 MED ORDER — ACETAMINOPHEN 325 MG PO TABS: 650.0000 mg | ORAL_TABLET | ORAL | Status: DC | PRN

## 2018-01-24 MED ORDER — OXYTOCIN BOLUS FROM INFUSION: 500.0000 mL | Freq: Once | INTRAVENOUS | Status: DC

## 2018-01-24 MED ORDER — OXYTOCIN 40 UNITS IN LACTATED RINGERS INFUSION - SIMPLE MED: 2.5000 [IU]/h | INTRAVENOUS | Status: DC

## 2018-01-24 NOTE — Final Progress Note (Signed)
Physician Final Progress Note  Patient ID: Jessica Mitchell MRN: [id number] DOB/AGE: September 21, 1990 26 y.o.  Admit date: 01/24/2018 Admitting provider: Nadara Mustard, MD Discharge date: 01/24/2018  Admission Diagnoses: [redacted] weeks pregnant with history of twin delivery by cesarean Suprapubic pain  Discharge Diagnoses:  Active Problems:   History of cesarean section   Pregnancy with suprapubic pain, antepartum   Consults: None  Significant Findings/ Diagnostic Studies:  Obstetrics Admission History & Physical   Abdominal Pain   HPI:  27 y.o. [id number] @ [redacted]w[redacted]d (04/10/2018, by Ultrasound). Admitted on 01/24/2018:   Patient Active Problem List   Diagnosis Date Noted  . Pregnancy with suprapubic pain, antepartum 01/24/2018  . Supervision of high risk pregnancy, antepartum 08/10/2017  . History of cesarean section 08/10/2017  . Depression   . Anxiety     Presents for 3 day h/o moderate suprapubic pain that is intermittent but worse at times without any association to cause this; she does work 2 jobs and is busy w work and taking care of 27 yo twins; denies pain as radiating, or modifiable other than rest, no other context.  P[facility]atal care at: at [facility]. Pregnancy complicated by prior CS.  ROS: A review of systems was performed and negative, except as stated in the above HPI. PMHx:  Past Medical History:  Diagnosis Date  . Anxiety   . Depression   . Fatigue   . Mucous in stools   . Type 2 diabetes mellitus (HCC)    pt states she does not have diabetes  . Unintentional weight loss    PSHx:  Past Surgical History:  Procedure Laterality Date  . CESAREAN SECTION    . FINGER SURGERY Right    2/2 trauma   Medications:  Medications Prior to Admission  Medication Sig Dispense Refill Last Dose  . Prenatal Vit-Fe Fumarate-FA (MULTIVITAMIN-PRENATAL) 27-0.8 MG TABS tablet Take 1 tablet by mouth daily at 12 noon.   Unknown at Unknown time   Allergies: has No Known Allergies. OBHx:   OB History  Gravida Para Term Preterm AB Living  2 1 0 1 0 2  SAB TAB Ectopic Multiple Live Births  0 0 0 1 2    # Outcome Date GA Lbr Len/2nd Weight Sex Delivery Anes PTL Lv  2 Current           1A Preterm    1814 g M CS-LTranv  Y LIV  1B Preterm    1361 g M CS-LTranv  Y LIV   JYN:WGNFAOZH/YQMVHQIONGEX except as detailed in HPI.Marland Kitchen  No family history of birth defects. Soc Hx: Alcohol: none and Recreational drug use: none  Objective:   Vitals:   01/24/18 1139  BP: 111/67  Pulse: 77  Resp: 16  Temp: 98.4 F (36.9 C)   Constitutional: Well nourished, well developed female in no acute distress.  HEENT: normal Skin: Warm and dry.  Cardiovascular:Regular rate and rhythm.   Extremity: trace to 1+ bilateral pedal edema Respiratory: Clear to auscultation bilateral. Normal respiratory effort Abdomen: mild T, gravid, ND; no rebound or guarding or UQ pain Back: no CVAT Neuro: DTRs 2+, Cranial nerves grossly intact Psych: Alert and Oriented x3. No memory deficits. Normal mood and affect.  MS: normal gait, normal bilateral lower extremity ROM/strength/stability.  Pelvic exam: is not limited by body habitus EGBUS: within normal limits Vagina: within normal limits and with normal mucosa blood in the vault Cervix: closed and thick and high Uterus: No contractions observed for 30 minutes.  Adnexa: not evaluated  EFM:FHR: 150 bpm, variability: moderate,  accelerations:  Present,  decelerations:  Absent Toco: None   Perinatal info:  Blood type: O negative Rubella- Immune Varicella -Immune TDaP tetanus status unknown to the patient RPR NR / HIV Neg/ HBsAg Neg   Assessment & Plan:   27 y.o. [id number] @ 4913w1d, Admitted on 01/24/2018: Suprapubic pain     Procedures: A NST procedure was performed with FHR monitoring and a normal baseline established, appropriate time of 20-40 minutes of evaluation, and accels >2 seen w 10x10 characteristics.  Results show a REACTIVE NST for this  gestational age.   Plan:  Monitor FWB and for ctx's UA fFN Risks of adhesion pain from prior CS, pregnancy growth pain, bladder pain, also PTL risks all d/w pt  Discharge Condition: good  Disposition: Discharge disposition: 01-Home or Self Care     Home  Diet: Regular diet  Discharge Activity: Activity as tolerated  Discharge Instructions    Call MD for:   Complete by:  As directed    Worsening contractions or pain; leakage of fluid; bleeding.   Diet general   Complete by:  As directed    Increase activity slowly   Complete by:  As directed      Allergies as of 01/24/2018   No Known Allergies     Medication List    TAKE these medications   multivitamin-prenatal 27-0.8 MG Tabs tablet Take 1 tablet by mouth daily at 12 noon.      Follow-up Information    Nadara MustardHarris, Marilena Trevathan P, MD. Go to.   Specialty:  Obstetrics and Gynecology Why:  as scheduled Contact information: [address] [place] [address] [phone]           Total time spent taking care of this patient: 20 minutes  Signed: Letitia Libraobert Paul Varnika Butz 01/24/2018, 1:25 PM

## 2018-01-24 NOTE — Discharge Summary (Signed)
  See FPN 

## 2018-01-24 NOTE — Telephone Encounter (Signed)
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  Adv to go to L&D via ED.  Amanda notified.

## 2018-01-24 NOTE — Discharge Instructions (Signed)

## 2018-01-24 NOTE — OB Triage Note (Signed)
Pt G2P2 at 4573w1d presents to ED c/o sharp lower abdominal pain that started 11/30 and gotten progressively worse ... Rate pain 8/10  Denies leaking of fluid, vaginal bleeding, or decreased fetal movement.VSS obtained and WNL, FHT 143 .

## 2018-02-02 ENCOUNTER — Ambulatory Visit (INDEPENDENT_AMBULATORY_CARE_PROVIDER_SITE_OTHER): Payer: Medicaid Other

## 2018-02-02 ENCOUNTER — Encounter: Payer: Self-pay | Admitting: Obstetrics and Gynecology

## 2018-02-02 ENCOUNTER — Other Ambulatory Visit: Payer: Self-pay | Admitting: Obstetrics & Gynecology

## 2018-02-02 ENCOUNTER — Ambulatory Visit (INDEPENDENT_AMBULATORY_CARE_PROVIDER_SITE_OTHER): Payer: Medicaid Other | Admitting: Obstetrics and Gynecology

## 2018-02-02 VITALS — BP 102/68 | Wt 135.0 lb

## 2018-02-02 DIAGNOSIS — O09213 Supervision of pregnancy with history of pre-term labor, third trimester: Secondary | ICD-10-CM

## 2018-02-02 DIAGNOSIS — O0990 Supervision of high risk pregnancy, unspecified, unspecified trimester: Secondary | ICD-10-CM

## 2018-02-02 DIAGNOSIS — O09893 Supervision of other high risk pregnancies, third trimester: Secondary | ICD-10-CM | POA: Insufficient documentation

## 2018-02-02 DIAGNOSIS — Z98891 History of uterine scar from previous surgery: Secondary | ICD-10-CM

## 2018-02-02 DIAGNOSIS — Z23 Encounter for immunization: Secondary | ICD-10-CM | POA: Diagnosis not present

## 2018-02-02 DIAGNOSIS — Z362 Encounter for other antenatal screening follow-up: Secondary | ICD-10-CM

## 2018-02-02 DIAGNOSIS — O34219 Maternal care for unspecified type scar from previous cesarean delivery: Secondary | ICD-10-CM

## 2018-02-02 DIAGNOSIS — Z3A30 30 weeks gestation of pregnancy: Secondary | ICD-10-CM

## 2018-02-02 NOTE — Progress Notes (Signed)
Routine Prenatal Care Visit  Subjective  Jessica Mitchell is a 27 y.o. G2P0102 at 873w3d being seen today for ongoing prenatal care.  She is currently monitored for the following issues for this high-risk pregnancy and has Depression; Anxiety; Supervision of high risk pregnancy, antepartum; History of cesarean section; Pregnancy with suprapubic pain, antepartum; and Hx of preterm delivery, currently pregnant, third trimester on their problem list.  ----------------------------------------------------------------------------------- Patient reports pelvic pressure.   Contractions: Not present. Vag. Bleeding: None.  Movement: Present. Denies leaking of fluid.  ----------------------------------------------------------------------------------- The following portions of the patient's history were reviewed and updated as appropriate: allergies, current medications, past family history, past medical history, past social history, past surgical history and problem list. Problem list updated.   Objective  Blood pressure 102/68, weight 135 lb (61.2 kg), last menstrual period 06/25/2017. Pregravid weight 107 lb (48.5 kg) Total Weight Gain 28 lb (12.7 kg) Urinalysis:      Fetal Status: Fetal Heart Rate (bpm): 140 Fundal Height: 32 cm Movement: Present     General:  Alert, oriented and cooperative. Patient is in no acute distress.  Skin: Skin is warm and dry. No rash noted.   Cardiovascular: Normal heart rate noted  Respiratory: Normal respiratory effort, no problems with respiration noted  Abdomen: Soft, gravid, appropriate for gestational age. Pain/Pressure: Present     Pelvic:  Cervical exam deferred        Extremities: Normal range of motion.  Edema: None  Mental Status: Normal mood and affect. Normal behavior. Normal judgment and thought content.     Assessment   27 y.o. G2P0102 at 673w3d by  04/10/2018, by Ultrasound presenting for routine prenatal visit  Plan   Pregnancy #2 Problems  (from 08/10/17 to present)    Problem Noted Resolved   Supervision of high risk pregnancy, antepartum 08/10/2017 by Conard NovakJackson, Stephen D, MD No   Overview Addendum 11/11/2017 10:02 AM by Natale MilchSchuman, Britiny Defrain R, MD      Clinic [facility] Prenatal Labs  Dating 6 wk US Blood type: O/Negative/-- (06/19 1130)   Genetic Screen NIPS: normal XY   Antibody:Negative (06/19 1130)  Anatomic US complete Rubella: 4.15 (06/19 1130)  Varicella: Immune  GTT  RPR: Non Reactive (06/19 1130)   Rhogam [ ]  28 weeks HBsAg: Negative (06/19 1130)   TDaP vaccine                       HIV: Non Reactive (06/19 1130)   Flu Shot   11/11/17                             GBS:   Contraception  None Pap: 08/10/2017 NIL  CBB     CS/VBAC  desires VBAC [ ] counseling   Baby Food Breast   Support Person  [place]           History of cesarean section 08/10/2017 by Conard NovakJackson, Stephen D, MD No       Gestational age appropriate obstetric precautions including but not limited to vaginal bleeding, contractions, leaking of fluid and fetal movement were reviewed in detail with the patient.    Tdap and tubal consents today.  Growth US normal. Cervical length normal. She declines a SVE today.  Cesarean scheduled for 04/08/15  After she left today noticed that her rhogam injection was not listed. Called, but no answer. Left a message asking if she had received it and asked her  to schedule an appointment to return and have it done.   Return in about 2 weeks (around 02/16/2018) for [name].  Natale Milch MD [facility], [facility] 02/02/2018, 5:38 PM

## 2018-02-02 NOTE — Progress Notes (Signed)
[name]/US BT/Tdap today  C/o pelvic pressure

## 2018-02-06 ENCOUNTER — Telehealth: Payer: Self-pay | Admitting: Obstetrics and Gynecology

## 2018-02-06 NOTE — Telephone Encounter (Signed)
No answer, vm not set up.

## 2018-02-06 NOTE — Telephone Encounter (Signed)
Patient is aware [facility] @ 8:00am w/ Dr Tiburcio PeaHarris, Pre-admit Testing to be scheduled, and OR on 04/04/18.

## 2018-02-06 NOTE — Telephone Encounter (Signed)
-----   Message from Natale Milchhristanna R Schuman, MD sent at 02/02/2018  5:12 PM EST ----- Surgery Booking Request Patient Full Name:  Jessica Mitchell  MRN: [id number]  DOB: December 03, 1990  Surgeon: Natale Milchhristanna R Schuman, MD  Requested Surgery Date and Time: Repeat cesarean section and BTL Primary Diagnosis AND Code: Hx of prior LTCS and desire for sterilization Secondary Diagnosis and Code:  Surgical Procedure: Cesarean section with tubal ligation L&D Notification: Yes Admission Status: same day surgery Length of Surgery: 1 hour Special Case Needs: none H&P: please schedule (date) Phone Interview???: yes Interpreter: Language:  Medical Clearance: no Special Scheduling Instructions: Schedule with Dr. Tiburcio PeaHarris per patient request for [place] or Harris.

## 2018-02-16 ENCOUNTER — Encounter: Payer: Medicaid Other | Admitting: Obstetrics and Gynecology

## 2018-02-26 ENCOUNTER — Observation Stay
Admission: EM | Admit: 2018-02-26 | Discharge: 2018-02-26 | Disposition: A | Discharge status: Home / Self Care | Payer: Medicaid Other | Attending: Obstetrics & Gynecology | Admitting: Obstetrics & Gynecology

## 2018-02-26 ENCOUNTER — Other Ambulatory Visit: Payer: Self-pay

## 2018-02-26 DIAGNOSIS — R1030 Lower abdominal pain, unspecified: Secondary | ICD-10-CM | POA: Insufficient documentation

## 2018-02-26 DIAGNOSIS — R109 Unspecified abdominal pain: Secondary | ICD-10-CM | POA: Diagnosis not present

## 2018-02-26 DIAGNOSIS — Z3A33 33 weeks gestation of pregnancy: Secondary | ICD-10-CM | POA: Insufficient documentation

## 2018-02-26 DIAGNOSIS — O26893 Other specified pregnancy related conditions, third trimester: Secondary | ICD-10-CM | POA: Diagnosis not present

## 2018-02-26 NOTE — Progress Notes (Signed)
Pt discharge home and instructed to keep appointment at the office tomorrow. Pt instructed to drink plenty of water and return if any concerns comes up or if experiencing any LOF, vaginal bleeding or increasingly painful contractions. Pt verbalized understanding. V/s WNL.

## 2018-02-26 NOTE — OB Triage Note (Signed)
Pt is a 28 y/o G2P2 at 33w 6d with c/o ctx starting roughly 2000 yesterday in the top of her abdomen and back. Pt denies vaginal bleeding. Pt states a slight increase in vaginal discharge but denies an gushes of fluid. PT states positive fetal movement. Monitors applied and assessing. Initial FHT 135.

## 2018-02-27 ENCOUNTER — Encounter: Payer: Self-pay | Admitting: Obstetrics and Gynecology

## 2018-02-27 ENCOUNTER — Ambulatory Visit (INDEPENDENT_AMBULATORY_CARE_PROVIDER_SITE_OTHER): Payer: Medicaid Other | Admitting: Obstetrics and Gynecology

## 2018-02-27 VITALS — BP 104/62 | Wt 138.0 lb

## 2018-02-27 DIAGNOSIS — Z6791 Unspecified blood type, Rh negative: Secondary | ICD-10-CM

## 2018-02-27 DIAGNOSIS — O26893 Other specified pregnancy related conditions, third trimester: Secondary | ICD-10-CM | POA: Diagnosis not present

## 2018-02-27 DIAGNOSIS — O09893 Supervision of other high risk pregnancies, third trimester: Secondary | ICD-10-CM

## 2018-02-27 DIAGNOSIS — O0990 Supervision of high risk pregnancy, unspecified, unspecified trimester: Secondary | ICD-10-CM

## 2018-02-27 DIAGNOSIS — Z3A34 34 weeks gestation of pregnancy: Secondary | ICD-10-CM

## 2018-02-27 DIAGNOSIS — O09213 Supervision of pregnancy with history of pre-term labor, third trimester: Secondary | ICD-10-CM

## 2018-02-27 DIAGNOSIS — Z98891 History of uterine scar from previous surgery: Secondary | ICD-10-CM

## 2018-02-27 DIAGNOSIS — O34219 Maternal care for unspecified type scar from previous cesarean delivery: Secondary | ICD-10-CM

## 2018-02-27 DIAGNOSIS — O26899 Other specified pregnancy related conditions, unspecified trimester: Secondary | ICD-10-CM

## 2018-02-27 MED ORDER — RHO D IMMUNE GLOBULIN 1500 UNIT/2ML IJ SOSY
300.0000 ug | PREFILLED_SYRINGE | Freq: Once | INTRAMUSCULAR | Status: AC
  Administered 2018-02-27: 300 ug via INTRAMUSCULAR

## 2018-02-27 NOTE — Progress Notes (Signed)
Routine Prenatal Care Visit  Subjective  Jessica Mitchell is a 28 y.o. G2P0102 at 625w0d being seen today for ongoing prenatal care.  She is currently monitored for the following issues for this high-risk pregnancy and has Depression; Anxiety; Supervision of high risk pregnancy, antepartum; History of cesarean section; Pregnancy with suprapubic pain, antepartum; Hx of preterm delivery, currently pregnant, third trimester; and Labor and delivery, indication for care on their problem list.  ----------------------------------------------------------------------------------- Patient reports irregular contractions.  Declined cervical examination. Contractions: Irregular. Vag. Bleeding: None.  Movement: Present. Denies leaking of fluid.  ----------------------------------------------------------------------------------- The following portions of the patient's history were reviewed and updated as appropriate: allergies, current medications, past family history, past medical history, past social history, past surgical history and problem list. Problem list updated.   Objective  Blood pressure 104/62, weight 138 lb (62.6 kg), last menstrual period 06/25/2017. Pregravid weight 107 lb (48.5 kg) Total Weight Gain 31 lb (14.1 kg) Urinalysis:      Fetal Status: Fetal Heart Rate (bpm): 145 Fundal Height: 34 cm Movement: Present     General:  Alert, oriented and cooperative. Patient is in no acute distress.  Skin: Skin is warm and dry. No rash noted.   Cardiovascular: Normal heart rate noted  Respiratory: Normal respiratory effort, no problems with respiration noted  Abdomen: Soft, gravid, appropriate for gestational age. Pain/Pressure: Present     Pelvic:  Cervical exam deferred        Extremities: Normal range of motion.  Edema: None  Mental Status: Normal mood and affect. Normal behavior. Normal judgment and thought content.     Assessment   28 y.o. G2P0102 at 6325w0d by  04/10/2018, by  Ultrasound presenting for routine prenatal visit  Plan   Pregnancy #2 Problems (from 08/10/17 to present)    Problem Noted Resolved   Supervision of high risk pregnancy, antepartum 08/10/2017 by Conard NovakJackson, Stephen D, MD No   Overview Addendum 02/27/2018  3:29 PM by Natale MilchSchuman, Christanna R, MD      [facility]inic [facility] Prenatal Labs  Dating 6 wk US Blood type: O/Negative/-- (06/19 1130)   Genetic Screen NIPS: normal XY   Antibody:Negative (06/19 1130)  Anatomic US complete Rubella: 4.15 (06/19 1130)  Varicella: Immune  GTT 104  RPR: Non Reactive (06/19 1130)   Rhogam 02/27/2018 HBsAg: Negative (06/19 1130)   TDaP vaccine     02/02/18                  HIV: Non Reactive (06/19 1130)   Flu Shot   11/11/17                             GBS:   Contraception   Pap: 08/10/2017 NIL  CBB     CS/VBAC desires repeat cesarean scheduled for 04/04/2018 with Tiburcio PeaHarris   Baby Food Breast   Support Person  [place]           History of cesarean section 08/10/2017 by Conard NovakJackson, Stephen D, MD No       Gestational age appropriate obstetric precautions including but not limited to vaginal bleeding, contractions, leaking of fluid and fetal movement were reviewed in detail with the patient.    Rhogam today Does not want a tubal ligation, does not want birth control postpartum Note given to be off of work.   Return in about 2 weeks (around 03/13/2018) for [name].  Natale Milchhristanna R Schuman MD [facility], [facility] 02/27/2018, 3:47 PM

## 2018-02-27 NOTE — Progress Notes (Signed)
[name] C/o contractions, was in hospital last night c contractions  Rhogam today

## 2018-02-28 NOTE — Discharge Summary (Signed)
  See FPN 

## 2018-02-28 NOTE — Final Progress Note (Signed)
Physician Final Progress Note  Patient ID: EVONE WENNINGER MRN: [id number] DOB/AGE: 1990/04/23 28 y.o.  Admit date: 02/26/2018 Admitting provider: Nadara Mustard, MD Discharge date: 02/28/2018  Admission Diagnoses: 33 weeks pregnancy, Lower abdominal pain pregnancy.  Discharge Diagnoses: same, no s/sx PTL. GERD  Consults: None  Significant Findings/ Diagnostic Studies: Patient presented for evaluation of labor.  Patient had cervical exam by RN and this was reported to me. I reviewed her vital signs and fetal tracing, both of which were reassuring.  Patient was discharge as she was not laboring.  Procedures: A NST procedure was performed with FHR monitoring and a normal baseline established, appropriate time of 20-40 minutes of evaluation, and accels >2 seen w 15x15 characteristics.  Results show a REACTIVE NST.   Discharge Condition: good  Disposition: Home, appt tomorrow for [facility]  Diet: Regular diet  Discharge Activity: Activity as tolerated   Allergies as of 02/26/2018   No Known Allergies     Medication List    ASK your doctor about these medications   multivitamin-prenatal 27-0.8 MG Tabs tablet Take 1 tablet by mouth daily at 12 noon.        Total time spent taking care of this patient: TRIAGE  Signed: Letitia Libra 02/28/2018, 9:41 AM

## 2018-03-13 ENCOUNTER — Ambulatory Visit (INDEPENDENT_AMBULATORY_CARE_PROVIDER_SITE_OTHER): Payer: Medicaid Other | Admitting: Obstetrics & Gynecology

## 2018-03-13 VITALS — BP 100/60 | Wt 141.0 lb

## 2018-03-13 DIAGNOSIS — O34219 Maternal care for unspecified type scar from previous cesarean delivery: Secondary | ICD-10-CM

## 2018-03-13 DIAGNOSIS — Z98891 History of uterine scar from previous surgery: Secondary | ICD-10-CM

## 2018-03-13 DIAGNOSIS — O0990 Supervision of high risk pregnancy, unspecified, unspecified trimester: Secondary | ICD-10-CM

## 2018-03-13 DIAGNOSIS — Z3A36 36 weeks gestation of pregnancy: Secondary | ICD-10-CM

## 2018-03-13 LAB — POCT URINALYSIS DIPSTICK OB
Glucose, UA: NEGATIVE
POC,PROTEIN,UA: NEGATIVE

## 2018-03-13 NOTE — Addendum Note (Signed)
Addended by: Cornelius MorasPATTERSON, Merilyn Pagan D on: 03/13/2018 10:16 AM   Modules accepted: Orders

## 2018-03-13 NOTE — Progress Notes (Signed)
  Subjective  Fetal Movement? yes Contractions? Yes, mild, intermittent Leaking Fluid? no Vaginal Bleeding? no  Objective  BP 100/60   Wt 141 lb (64 kg)   LMP 06/25/2017   BMI 24.98 kg/m  General: NAD Pumonary: no increased work of breathing Abdomen: gravid, non-tender Extremities: no edema Psychiatric: mood appropriate, affect full  Assessment  28 y.o. G2P0102 at [redacted]w[redacted]d by  04/10/2018, by Ultrasound presenting for routine prenatal visit  Plan   Problem List Items Addressed This Visit      Other   Supervision of high risk pregnancy, antepartum - Primary   History of cesarean section    Other Visit Diagnoses    [redacted] weeks gestation of pregnancy       Relevant Orders   Culture, beta strep (group b only)    CS planned 2/11, but will consider TOLAC if spontaneous labors prior Does not desire BTL (never wanted).  No BC desired at this time of planning.  Annamarie Major, MD, [facility], [facility] 03/13/2018  10:12 AM

## 2018-03-13 NOTE — Patient Instructions (Signed)
Cesarean Delivery  Cesarean birth, or cesarean delivery, is the surgical delivery of a baby through an incision in the abdomen and the uterus. This may be referred to as a C-section. This procedure may be scheduled ahead of time, or it may be done in an emergency situation.  Tell a health care provider about:   Any allergies you have.   All medicines you are taking, including vitamins, herbs, eye drops, creams, and over-the-counter medicines.   Any problems you or family members have had with anesthetic medicines.   Any blood disorders you have.   Any surgeries you have had.   Any medical conditions you have.   Whether you or any members of your family have a history of deep vein thrombosis (DVT) or pulmonary embolism (PE).  What are the risks?  Generally, this is a safe procedure. However, problems may occur, including:   Infection.   Bleeding.   Allergic reactions to medicines.   Damage to other structures or organs.   Blood clots.   Injury to your baby.  What happens before the procedure?  General instructions   Follow instructions from your health care provider about eating or drinking restrictions.   If you know that you are going to have a cesarean delivery, do not shave your pubic area. Shaving before the procedure may increase your risk of infection.   Plan to have someone take you home from the hospital.   Ask your health care provider what steps will be taken to prevent infection. These may include:  ? Removing hair at the surgery site.  ? Washing skin with a germ-killing soap.  ? Taking antibiotic medicine.   Depending on the reason for your cesarean delivery, you may have a physical exam or additional testing, such as an ultrasound.   You may have your blood or urine tested.  Questions for your health care provider   Ask your health care provider about:  ? Changing or stopping your regular medicines. This is especially important if you are taking diabetes medicines or blood  thinners.  ? Your pain management plan. This is especially important if you plan to breastfeed your baby.  ? How long you will be in the hospital after the procedure.  ? Any concerns you may have about receiving blood products, if you need them during the procedure.  ? Cord blood banking, if you plan to collect your baby's umbilical cord blood.   You may also want to ask your health care provider:  ? Whether you will be able to hold or breastfeed your baby while you are still in the operating room.  ? Whether your baby can stay with you immediately after the procedure and during your recovery.  ? Whether a family member or a person of your choice can go with you into the operating room and stay with you during the procedure, immediately after the procedure, and during your recovery.  What happens during the procedure?     An IV will be inserted into one of your veins.   Fluid and medicines, such as antibiotics, will be given before the surgery.   Fetal monitors will be placed on your abdomen to check your baby's heart rate.   You may be given a special warming gown to wear to keep your temperature stable.   A catheter may be inserted into your bladder through your urethra. This drains your urine during the procedure.   You may be given one or more of   the following:  ? A medicine to numb the area (local anesthetic).  ? A medicine to make you fall asleep (general anesthetic).  ? A medicine (regional anesthetic) that is injected into your back or through a small thin tube placed in your back (spinal anesthetic or epidural anesthetic). This numbs everything below the injection site and allows you to stay awake during your procedure. If this makes you feel nauseous, tell your health care provider. Medicines will be available to help reduce any nausea you may feel.   An incision will be made in your abdomen, and then in your uterus.   If you are awake during your procedure, you may feel tugging and pulling in  your abdomen, but you should not feel pain. If you feel pain, tell your health care provider immediately.   Your baby will be removed from your uterus. You may feel more pressure or pushing while this happens.   Immediately after birth, your baby will be dried and kept warm. You may be able to hold and breastfeed your baby.   The umbilical cord may be clamped and cut during this time. This usually occurs after waiting a period of 1-2 minutes after delivery.   Your placenta will be removed from your uterus.   Your incisions will be closed with stitches (sutures). Staples, skin glue, or adhesive strips may also be applied to the incision in your abdomen.   Bandages (dressings) may be placed over the incision in your abdomen.  The procedure may vary among health care providers and hospitals.  What happens after the procedure?   Your blood pressure, heart rate, breathing rate, and blood oxygen level will be monitored until you are discharged from the hospital.   You may continue to receive fluids and medicines through an IV.   You will have some pain. Medicines will be available to help control your pain.   To help prevent blood clots:  ? You may be given medicines.  ? You may have to wear compression stockings or devices.  ? You will be encouraged to walk around when you are able.   Hospital staff will encourage and support bonding with your baby. Your hospital may have you and your baby to stay in the same room (rooming in) during your hospital stay to encourage successful bonding and breastfeeding.   You may be encouraged to cough and breathe deeply often. This helps to prevent lung problems.   If you have a catheter draining your urine, it will be removed as soon as possible after your procedure.  Summary   Cesarean birth, or cesarean delivery, is the surgical delivery of a baby through an incision in the abdomen and the uterus.   Follow instructions from your health care provider about eating or  drinking restrictions before the procedure.   You will have some pain after the procedure. Medicines will be available to help control your pain.   Hospital staff will encourage and support bonding with your baby after the procedure. Your hospital may have you and your baby to stay in the same room (rooming in) during your hospital stay to encourage successful bonding and breastfeeding.  This information is not intended to replace advice given to you by your health care provider. Make sure you discuss any questions you have with your health care provider.  Document Released: 02/08/2005 Document Revised: 08/15/2017 Document Reviewed: 08/15/2017  Elsevier Interactive Patient Education  2019 [employer]

## 2018-03-17 LAB — CULTURE, BETA STREP (GROUP B ONLY): Strep Gp B Culture: NEGATIVE

## 2018-03-20 ENCOUNTER — Telehealth: Payer: Self-pay

## 2018-03-20 ENCOUNTER — Encounter: Payer: Medicaid Other | Admitting: Obstetrics & Gynecology

## 2018-03-20 NOTE — Telephone Encounter (Signed)
Pt c/o pain in pelvis; ctxs 2-58min apart; feels exhausted and sick.  Has appt Thurs.  Wants to know if she can wait til Thurs to be seen.  [phone].  Adv pt to go to L&D.  Pt doesn't want to go b/c she hadn't dilated any the last time she went and was sent home.  States she feels the baby low in pelvis and vaginal area.  Again adv to go to L&D or she may deliver at home.  That's what L&D is there for.  Pt finally said okay.  [place] notified.

## 2018-03-23 ENCOUNTER — Ambulatory Visit (INDEPENDENT_AMBULATORY_CARE_PROVIDER_SITE_OTHER): Payer: Medicaid Other | Admitting: Obstetrics & Gynecology

## 2018-03-23 VITALS — BP 100/60 | Wt 143.0 lb

## 2018-03-23 DIAGNOSIS — Z3A37 37 weeks gestation of pregnancy: Secondary | ICD-10-CM

## 2018-03-23 DIAGNOSIS — Z6791 Unspecified blood type, Rh negative: Secondary | ICD-10-CM

## 2018-03-23 DIAGNOSIS — O26893 Other specified pregnancy related conditions, third trimester: Secondary | ICD-10-CM

## 2018-03-23 DIAGNOSIS — O0990 Supervision of high risk pregnancy, unspecified, unspecified trimester: Secondary | ICD-10-CM

## 2018-03-23 DIAGNOSIS — O34219 Maternal care for unspecified type scar from previous cesarean delivery: Secondary | ICD-10-CM

## 2018-03-23 DIAGNOSIS — Z98891 History of uterine scar from previous surgery: Secondary | ICD-10-CM

## 2018-03-23 DIAGNOSIS — O26899 Other specified pregnancy related conditions, unspecified trimester: Secondary | ICD-10-CM

## 2018-03-23 DIAGNOSIS — O0991 Supervision of high risk pregnancy, unspecified, first trimester: Secondary | ICD-10-CM

## 2018-03-23 NOTE — Progress Notes (Signed)
  Subjective  Fetal Movement? yes Contractions? no Leaking Fluid? no Vaginal Bleeding? no  Objective  BP 100/60   Wt 143 lb (64.9 kg)   LMP 06/25/2017   BMI 25.33 kg/m  General: NAD Pumonary: no increased work of breathing Abdomen: gravid, non-tender Extremities: no edema Psychiatric: mood appropriate, affect full SVE FT/70/-3 Assessment  28 y.o. G2P0102 at [redacted]w[redacted]d by  04/10/2018, by Ultrasound presenting for routine prenatal visit  Plan   Problem List Items Addressed This Visit      Other   Supervision of high risk pregnancy, antepartum   History of cesarean section    Other Visit Diagnoses    [redacted] weeks gestation of pregnancy    -  Primary   Rh negative state in antepartum period        VBAC desired if labor before 04/04/2018, may consider extending date of surgery to give more time for spon labor  Annamarie Major, MD, [facility], [facility] 03/23/2018  4:18 PM

## 2018-03-29 ENCOUNTER — Ambulatory Visit (INDEPENDENT_AMBULATORY_CARE_PROVIDER_SITE_OTHER): Payer: Medicaid Other | Admitting: Obstetrics & Gynecology

## 2018-03-29 VITALS — BP 100/60 | Wt 147.0 lb

## 2018-03-29 DIAGNOSIS — O0990 Supervision of high risk pregnancy, unspecified, unspecified trimester: Secondary | ICD-10-CM

## 2018-03-29 DIAGNOSIS — Z98891 History of uterine scar from previous surgery: Secondary | ICD-10-CM

## 2018-03-29 DIAGNOSIS — O09213 Supervision of pregnancy with history of pre-term labor, third trimester: Secondary | ICD-10-CM

## 2018-03-29 DIAGNOSIS — O34219 Maternal care for unspecified type scar from previous cesarean delivery: Secondary | ICD-10-CM

## 2018-03-29 DIAGNOSIS — Z3A38 38 weeks gestation of pregnancy: Secondary | ICD-10-CM

## 2018-03-29 DIAGNOSIS — O09893 Supervision of other high risk pregnancies, third trimester: Secondary | ICD-10-CM

## 2018-03-29 LAB — POCT URINALYSIS DIPSTICK OB
Glucose, UA: NEGATIVE
POC,PROTEIN,UA: NEGATIVE

## 2018-03-29 NOTE — Patient Instructions (Signed)
BABY NEXT WEEK!

## 2018-03-29 NOTE — Progress Notes (Signed)
  Subjective  Fetal Movement? yes Contractions? no Leaking Fluid? no Vaginal Bleeding? no Back pain, some lower pelvic pressure Objective  BP 100/60   Wt 147 lb (66.7 kg)   LMP 06/25/2017   BMI 26.04 kg/m  General: NAD Pumonary: no increased work of breathing Abdomen: gravid, non-tender Extremities: no edema Psychiatric: mood appropriate, affect full SVE: FT/70/-3 Assessment  27 y.o. [id number] at [redacted]w[redacted]d by  04/10/2018, by Ultrasound presenting for routine prenatal visit  Plan   Problem List Items Addressed This Visit      Other   Supervision of high risk pregnancy, antepartum   History of cesarean section   Hx of preterm delivery, currently pregnant, third trimester    Other Visit Diagnoses    [redacted] weeks gestation of pregnancy    -  Primary   Relevant Orders   POC Urinalysis Dipstick OB (Completed)    VBAC if labors Preop/ follow up Monday  Annamarie Major, MD, [facility], [facility] 03/29/2018  12:02 PM

## 2018-04-02 ENCOUNTER — Other Ambulatory Visit: Payer: Self-pay

## 2018-04-02 ENCOUNTER — Inpatient Hospital Stay: Payer: Medicaid Other | Admitting: Certified Registered"

## 2018-04-02 ENCOUNTER — Encounter
Admission: EM | Disposition: A | Discharge status: Home / Self Care | Payer: Self-pay | Source: Home / Self Care | Attending: Obstetrics and Gynecology

## 2018-04-02 ENCOUNTER — Inpatient Hospital Stay
Admission: EM | Admit: 2018-04-02 | Discharge: 2018-04-04 | DRG: 787 | Disposition: A | Discharge status: Home / Self Care | Payer: Medicaid Other | Attending: Obstetrics and Gynecology | Admitting: Obstetrics and Gynecology

## 2018-04-02 DIAGNOSIS — O9081 Anemia of the puerperium: Secondary | ICD-10-CM | POA: Diagnosis not present

## 2018-04-02 DIAGNOSIS — O0990 Supervision of high risk pregnancy, unspecified, unspecified trimester: Secondary | ICD-10-CM

## 2018-04-02 DIAGNOSIS — Z3A38 38 weeks gestation of pregnancy: Secondary | ICD-10-CM

## 2018-04-02 DIAGNOSIS — D62 Acute posthemorrhagic anemia: Secondary | ICD-10-CM | POA: Diagnosis not present

## 2018-04-02 DIAGNOSIS — O34211 Maternal care for low transverse scar from previous cesarean delivery: Secondary | ICD-10-CM

## 2018-04-02 DIAGNOSIS — O34219 Maternal care for unspecified type scar from previous cesarean delivery: Secondary | ICD-10-CM | POA: Diagnosis not present

## 2018-04-02 DIAGNOSIS — Z98891 History of uterine scar from previous surgery: Secondary | ICD-10-CM

## 2018-04-02 LAB — CBC
HCT: 37.6 % (ref 36.0–46.0)
Hemoglobin: 11.9 g/dL — ABNORMAL LOW (ref 12.0–15.0)
MCH: 27.7 pg (ref 26.0–34.0)
MCHC: 31.6 g/dL (ref 30.0–36.0)
MCV: 87.6 fL (ref 80.0–100.0)
Platelets: 172 10*3/uL (ref 150–400)
RBC: 4.29 MIL/uL (ref 3.87–5.11)
RDW: 13.8 % (ref 11.5–15.5)
WBC: 17.6 10*3/uL — ABNORMAL HIGH (ref 4.0–10.5)
nRBC: 0 % (ref 0.0–0.2)

## 2018-04-02 LAB — RAPID HIV SCREEN (HIV 1/2 AB+AG)
HIV 1/2 Antibodies: NONREACTIVE
HIV-1 P24 Antigen - HIV24: NONREACTIVE

## 2018-04-02 SURGERY — Surgical Case
Anesthesia: Spinal

## 2018-04-02 MED ORDER — FENTANYL CITRATE (PF) 100 MCG/2ML IJ SOLN
INTRAMUSCULAR | Status: AC
  Filled 2018-04-02: qty 2

## 2018-04-02 MED ORDER — IBUPROFEN 600 MG PO TABS
600.0000 mg | ORAL_TABLET | Freq: Four times a day (QID) | ORAL | Status: DC
  Administered 2018-04-03 – 2018-04-04 (×5): 600 mg via ORAL
  Filled 2018-04-02 (×5): qty 1

## 2018-04-02 MED ORDER — ONDANSETRON HCL 4 MG/2ML IJ SOLN: 4.0000 mg | Freq: Once | INTRAMUSCULAR | Status: DC | PRN

## 2018-04-02 MED ORDER — DIPHENHYDRAMINE HCL 50 MG/ML IJ SOLN: 12.5000 mg | INTRAMUSCULAR | Status: DC | PRN

## 2018-04-02 MED ORDER — FERROUS SULFATE 325 (65 FE) MG PO TABS
325.0000 mg | ORAL_TABLET | Freq: Two times a day (BID) | ORAL | Status: DC
  Administered 2018-04-02 – 2018-04-04 (×4): 325 mg via ORAL
  Filled 2018-04-02 (×4): qty 1

## 2018-04-02 MED ORDER — PHENYLEPHRINE HCL 10 MG/ML IJ SOLN
INTRAMUSCULAR | Status: DC | PRN
  Administered 2018-04-02 (×2): 50 ug via INTRAVENOUS

## 2018-04-02 MED ORDER — KETOROLAC TROMETHAMINE 30 MG/ML IJ SOLN: 30.0000 mg | Freq: Four times a day (QID) | INTRAMUSCULAR | Status: DC | PRN

## 2018-04-02 MED ORDER — MORPHINE SULFATE (PF) 0.5 MG/ML IJ SOLN
INTRAMUSCULAR | Status: AC
  Filled 2018-04-02: qty 10

## 2018-04-02 MED ORDER — DIPHENHYDRAMINE HCL 25 MG PO CAPS
25.0000 mg | ORAL_CAPSULE | ORAL | Status: DC | PRN
  Administered 2018-04-02: 25 mg via ORAL

## 2018-04-02 MED ORDER — OXYCODONE-ACETAMINOPHEN 5-325 MG PO TABS
1.0000 | ORAL_TABLET | ORAL | Status: DC | PRN
  Administered 2018-04-03 (×2): 1 via ORAL
  Filled 2018-04-02 (×2): qty 1

## 2018-04-02 MED ORDER — NALBUPHINE HCL 10 MG/ML IJ SOLN
INTRAMUSCULAR | Status: AC
  Administered 2018-04-02: 5 mg via INTRAVENOUS
  Filled 2018-04-02: qty 1

## 2018-04-02 MED ORDER — WITCH HAZEL-GLYCERIN EX PADS: 1.0000 "application " | MEDICATED_PAD | CUTANEOUS | Status: DC | PRN

## 2018-04-02 MED ORDER — FENTANYL CITRATE (PF) 100 MCG/2ML IJ SOLN: 25.0000 ug | INTRAMUSCULAR | Status: DC | PRN

## 2018-04-02 MED ORDER — LACTATED RINGERS IV SOLN
INTRAVENOUS | Status: DC | PRN
  Administered 2018-04-02: 06:00:00 via INTRAVENOUS

## 2018-04-02 MED ORDER — MEPERIDINE HCL 25 MG/ML IJ SOLN: 6.2500 mg | INTRAMUSCULAR | Status: DC | PRN

## 2018-04-02 MED ORDER — OXYTOCIN 40 UNITS IN NORMAL SALINE INFUSION - SIMPLE MED: 2.5000 [IU]/h | INTRAVENOUS | Status: AC

## 2018-04-02 MED ORDER — LACTATED RINGERS IV SOLN: INTRAVENOUS | Status: DC

## 2018-04-02 MED ORDER — BUPIVACAINE IN DEXTROSE 0.75-8.25 % IT SOLN
INTRATHECAL | Status: DC | PRN
  Administered 2018-04-02: 1.6 mL via INTRATHECAL

## 2018-04-02 MED ORDER — DIBUCAINE 1 % RE OINT: 1.0000 "application " | TOPICAL_OINTMENT | RECTAL | Status: DC | PRN

## 2018-04-02 MED ORDER — OXYTOCIN 40 UNITS IN NORMAL SALINE INFUSION - SIMPLE MED
INTRAVENOUS | Status: AC
  Filled 2018-04-02: qty 1000

## 2018-04-02 MED ORDER — FENTANYL CITRATE (PF) 100 MCG/2ML IJ SOLN
INTRAMUSCULAR | Status: DC | PRN
  Administered 2018-04-02: 20 ug via INTRATHECAL

## 2018-04-02 MED ORDER — BUPIVACAINE HCL (PF) 0.5 % IJ SOLN: 5.0000 mL | Freq: Once | INTRAMUSCULAR | Status: DC

## 2018-04-02 MED ORDER — CEFAZOLIN SODIUM-DEXTROSE 2-4 GM/100ML-% IV SOLN
2.0000 g | INTRAVENOUS | Status: AC
  Administered 2018-04-02: 2 g via INTRAVENOUS
  Filled 2018-04-02: qty 100

## 2018-04-02 MED ORDER — SOD CITRATE-CITRIC ACID 500-334 MG/5ML PO SOLN
30.0000 mL | ORAL | Status: AC
  Administered 2018-04-02: 30 mL via ORAL

## 2018-04-02 MED ORDER — PRENATAL MULTIVITAMIN CH
1.0000 | ORAL_TABLET | Freq: Every day | ORAL | Status: DC
  Administered 2018-04-03: 1 via ORAL
  Filled 2018-04-02: qty 1

## 2018-04-02 MED ORDER — NALOXONE HCL 0.4 MG/ML IJ SOLN: 0.4000 mg | INTRAMUSCULAR | Status: DC | PRN

## 2018-04-02 MED ORDER — TERBUTALINE SULFATE 1 MG/ML IJ SOLN
0.2500 mg | Freq: Once | INTRAMUSCULAR | Status: AC
  Administered 2018-04-02: 0.25 mg via SUBCUTANEOUS

## 2018-04-02 MED ORDER — OXYTOCIN 40 UNITS IN NORMAL SALINE INFUSION - SIMPLE MED
INTRAVENOUS | Status: DC | PRN
  Administered 2018-04-02: 1000 mL via INTRAVENOUS

## 2018-04-02 MED ORDER — DIPHENHYDRAMINE HCL 25 MG PO CAPS
25.0000 mg | ORAL_CAPSULE | Freq: Four times a day (QID) | ORAL | Status: DC | PRN
  Filled 2018-04-02: qty 1

## 2018-04-02 MED ORDER — SENNOSIDES-DOCUSATE SODIUM 8.6-50 MG PO TABS
2.0000 | ORAL_TABLET | ORAL | Status: DC
  Administered 2018-04-03 – 2018-04-04 (×2): 2 via ORAL
  Filled 2018-04-02 (×4): qty 2

## 2018-04-02 MED ORDER — ONDANSETRON HCL 4 MG/2ML IJ SOLN: 4.0000 mg | Freq: Three times a day (TID) | INTRAMUSCULAR | Status: DC | PRN

## 2018-04-02 MED ORDER — NALBUPHINE HCL 10 MG/ML IJ SOLN: 5.0000 mg | INTRAMUSCULAR | Status: DC | PRN

## 2018-04-02 MED ORDER — OXYTOCIN 40 UNITS IN NORMAL SALINE INFUSION - SIMPLE MED
INTRAVENOUS | Status: AC
  Administered 2018-04-02: 10:00:00
  Filled 2018-04-02: qty 1000

## 2018-04-02 MED ORDER — COCONUT OIL OIL: 1.0000 "application " | TOPICAL_OIL | Status: DC | PRN

## 2018-04-02 MED ORDER — MORPHINE SULFATE (PF) 0.5 MG/ML IJ SOLN
INTRAMUSCULAR | Status: DC | PRN
  Administered 2018-04-02: .15 mg via INTRATHECAL

## 2018-04-02 MED ORDER — BUPIVACAINE HCL (PF) 0.5 % IJ SOLN
INTRAMUSCULAR | Status: AC
  Filled 2018-04-02: qty 30

## 2018-04-02 MED ORDER — ACETAMINOPHEN 500 MG PO TABS
1000.0000 mg | ORAL_TABLET | Freq: Four times a day (QID) | ORAL | Status: AC
  Administered 2018-04-02 (×2): 1000 mg via ORAL
  Filled 2018-04-02 (×3): qty 2

## 2018-04-02 MED ORDER — NALBUPHINE HCL 10 MG/ML IJ SOLN
5.0000 mg | INTRAMUSCULAR | Status: DC | PRN
  Administered 2018-04-02 (×2): 5 mg via INTRAVENOUS
  Filled 2018-04-02: qty 1

## 2018-04-02 MED ORDER — TERBUTALINE SULFATE 1 MG/ML IJ SOLN
INTRAMUSCULAR | Status: AC
  Filled 2018-04-02: qty 1

## 2018-04-02 MED ORDER — SODIUM CHLORIDE 0.9 % IV SOLN
INTRAVENOUS | Status: DC | PRN
  Administered 2018-04-02: 25 ug/min via INTRAVENOUS

## 2018-04-02 MED ORDER — OXYCODONE-ACETAMINOPHEN 5-325 MG PO TABS: 2.0000 | ORAL_TABLET | ORAL | Status: DC | PRN

## 2018-04-02 MED ORDER — ONDANSETRON HCL 4 MG/2ML IJ SOLN
INTRAMUSCULAR | Status: DC | PRN
  Administered 2018-04-02: 4 mg via INTRAVENOUS

## 2018-04-02 MED ORDER — BUPIVACAINE 0.25 % ON-Q PUMP DUAL CATH 400 ML
400.0000 mL | INJECTION | Status: DC
  Filled 2018-04-02: qty 400

## 2018-04-02 MED ORDER — KETOROLAC TROMETHAMINE 30 MG/ML IJ SOLN
30.0000 mg | Freq: Four times a day (QID) | INTRAMUSCULAR | Status: DC | PRN
  Administered 2018-04-02 (×2): 30 mg via INTRAVENOUS
  Filled 2018-04-02 (×2): qty 1

## 2018-04-02 MED ORDER — SIMETHICONE 80 MG PO CHEW
80.0000 mg | CHEWABLE_TABLET | Freq: Three times a day (TID) | ORAL | Status: DC
  Administered 2018-04-02 – 2018-04-04 (×5): 80 mg via ORAL
  Filled 2018-04-02 (×5): qty 1

## 2018-04-02 MED ORDER — BUPIVACAINE HCL 0.5 % IJ SOLN
INTRAMUSCULAR | Status: DC | PRN
  Administered 2018-04-02: 10 mL

## 2018-04-02 MED ORDER — SODIUM CHLORIDE 0.9% FLUSH: 3.0000 mL | INTRAVENOUS | Status: DC | PRN

## 2018-04-02 MED ORDER — SOD CITRATE-CITRIC ACID 500-334 MG/5ML PO SOLN
ORAL | Status: AC
  Administered 2018-04-02: 30 mL via ORAL
  Filled 2018-04-02: qty 15

## 2018-04-02 MED ORDER — MENTHOL 3 MG MT LOZG
1.0000 | LOZENGE | OROMUCOSAL | Status: DC | PRN
  Filled 2018-04-02: qty 9

## 2018-04-02 SURGICAL SUPPLY — 28 items
CANISTER SUCT 3000ML PPV (MISCELLANEOUS) ×2 IMPLANT
CATH KIT ON-Q SILVERSOAK 5IN (CATHETERS) ×4 IMPLANT
COVER WAND RF STERILE (DRAPES) IMPLANT
DERMABOND ADVANCED (GAUZE/BANDAGES/DRESSINGS) ×1
DERMABOND ADVANCED .7 DNX12 (GAUZE/BANDAGES/DRESSINGS) ×1 IMPLANT
DRSG OPSITE POSTOP 4X10 (GAUZE/BANDAGES/DRESSINGS) IMPLANT
DRSG TELFA 3X8 NADH (GAUZE/BANDAGES/DRESSINGS) ×2 IMPLANT
ELECT CAUTERY BLADE 6.4 (BLADE) ×2 IMPLANT
ELECT REM PT RETURN 9FT ADLT (ELECTROSURGICAL) ×2
ELECTRODE REM PT RTRN 9FT ADLT (ELECTROSURGICAL) ×1 IMPLANT
GAUZE SPONGE 4X4 12PLY STRL (GAUZE/BANDAGES/DRESSINGS) ×2 IMPLANT
GLOVE BIO SURGEON STRL SZ7 (GLOVE) ×10 IMPLANT
GLOVE INDICATOR 7.5 STRL GRN (GLOVE) ×10 IMPLANT
GOWN STRL REUS W/ TWL LRG LVL3 (GOWN DISPOSABLE) ×5 IMPLANT
GOWN STRL REUS W/TWL LRG LVL3 (GOWN DISPOSABLE) ×5
NS IRRIG 1000ML POUR BTL (IV SOLUTION) ×2 IMPLANT
PACK C SECTION AR (MISCELLANEOUS) ×2 IMPLANT
PAD OB MATERNITY 4.3X12.25 (PERSONAL CARE ITEMS) ×2 IMPLANT
PAD PREP 24X41 OB/GYN DISP (PERSONAL CARE ITEMS) ×2 IMPLANT
STRIP CLOSURE SKIN 1/2X4 (GAUZE/BANDAGES/DRESSINGS) ×2 IMPLANT
SUT MNCRL 4-0 (SUTURE) ×1
SUT MNCRL 4-0 27XMFL (SUTURE) ×1
SUT PDS AB 1 TP1 96 (SUTURE) ×2 IMPLANT
SUT PLAIN GUT 0 (SUTURE) IMPLANT
SUT VIC AB 0 CTX 36 (SUTURE) ×2
SUT VIC AB 0 CTX36XBRD ANBCTRL (SUTURE) ×2 IMPLANT
SUTURE MNCRL 4-0 27XMF (SUTURE) ×1 IMPLANT
SWABSTK COMLB BENZOIN TINCTURE (MISCELLANEOUS) ×2 IMPLANT

## 2018-04-02 NOTE — Anesthesia Preprocedure Evaluation (Signed)
Anesthesia Evaluation  Patient identified by MRN, date of birth, ID band Patient awake    Reviewed: Allergy & Precautions, H&P , NPO status , Patient's Chart, lab work & pertinent test results, reviewed documented beta blocker date and time   Airway Mallampati: II  TM Distance: >3 FB Neck ROM: full    Dental no notable dental hx. (+) Teeth Intact   Pulmonary neg pulmonary ROS, Current Smoker,    Pulmonary exam normal breath sounds clear to auscultation       Cardiovascular Exercise Tolerance: Good negative cardio ROS   Rhythm:regular Rate:Normal     Neuro/Psych PSYCHIATRIC DISORDERS Anxiety Depression negative neurological ROS     GI/Hepatic negative GI ROS, Neg liver ROS,   Endo/Other  negative endocrine ROSdiabetes, Gestational  Renal/GU      Musculoskeletal   Abdominal   Peds  Hematology negative hematology ROS (+)   Anesthesia Other Findings   Reproductive/Obstetrics (+) Pregnancy                             Anesthesia Physical Anesthesia Plan  ASA: II and emergent  Anesthesia Plan: Spinal   Post-op Pain Management:    Induction:   PONV Risk Score and Plan:   Airway Management Planned:   Additional Equipment:   Intra-op Plan:   Post-operative Plan:   Informed Consent: I have reviewed the patients History and Physical, chart, labs and discussed the procedure including the risks, benefits and alternatives for the proposed anesthesia with the patient or authorized representative who has indicated his/her understanding and acceptance.       Plan Discussed with:   Anesthesia Plan Comments:         Anesthesia Quick Evaluation

## 2018-04-02 NOTE — OB Triage Note (Signed)
  Patient came in at 0220 from home complaining of contractions that started at 0600 yesterday 04/01/18. Patient reports contractions to be every 10-15 minutes apart. Rating pain a 10 out of 10. Patient reports positive fetal movement. Denies vaginal bleeding. Denies leakage of fluids. Patient stated that she has a scheduled repeat c-section on 04/04/18  Will inform provider and continue to assess and monitor. Call bell within reach. Bed in lowest position. Side rails up. Family at bedside.

## 2018-04-02 NOTE — Anesthesia Post-op Follow-up Note (Signed)
Anesthesia QCDR form completed.        

## 2018-04-02 NOTE — Discharge Summary (Signed)
OB Discharge Summary     Patient Name: Jessica Mitchell DOB: 09/07/1990 MRN: [id number]  Date of admission: 04/02/2018 Delivering MD: Thomasene Mohair, MD  Date of Delivery: 04/02/2018  Date of discharge:04/04/2018 Admitting diagnosis: Normal labor, history of cesarean section Intrauterine pregnancy: [redacted]w[redacted]d     Secondary diagnosis: None     Discharge diagnosis: Term Pregnancy Delivered ; repeat low transverse Cesarean section; Acute blood loss anemia                                                                                              Post partum procedures:rhogam  Augmentation: n/a  Complications: None  Hospital course:  Cesarean section:   28 y.o. yo G2P1103 at [redacted]w[redacted]d was admitted to the hospital 04/02/2018 for labor with history of cesarean section.  She was taken to the operating room for cesarean delivery with the following indication:Elective Repeat.  Membrane Rupture Time/Date: 6:48 AM ,04/02/2018   Patient delivered a Viable infant.04/02/2018  Details of operation can be found in separate operative note.  Pateint had an uncomplicated postpartum course and was asymptomatic of her postoperative anemia with hemoglobin 8.2 gm/dl. She is ambulating, tolerating a regular diet, passing flatus, and urinating well. Patient is discharged home in stable condition on  04/04/18         Physical exam  Vitals:   04/03/18 0745 04/03/18 1224 04/03/18 2339 04/04/18 0757  BP: 116/69 99/89 116/79 118/65  Pulse: 60 78 70 68  Resp: 15 15 18 20   Temp: 97.6 F (36.4 C) (!) 97.4 F (36.3 C) 97.8 F (36.6 C) 98 F (36.7 C)  TempSrc: Axillary Oral Oral   SpO2:   100% 99%  Weight:      Height:       General: alert, cooperative and no distress . Passing flatus. Has had a BM. Tolerating regular diet. Voiding without difficulty. Denies lightheadedness. Bottle feeding Heart: RRR without murmur Lungs: CTAB Lochia: appropriate Uterine Fundus: firm/ U-3/ ML/ NT Incision: Healing well with no  significant drainage (some dried blood on steristrips just to right of midline)/ ON Q intact DVT Evaluation: No evidence of DVT seen on physical exam.  Labs: Lab Results  Component Value Date   WBC 14.6 (H) 04/03/2018   HGB 8.2 (L) 04/03/2018   HCT 26.3 (L) 04/03/2018   MCV 86.8 04/03/2018   PLT 127 (L) 04/03/2018   Mother: O negative blood type Information for the patient's newborn:  Shakiyla, [place] [[id number]]  O POS  Discharge instruction: per After Visit Summary.  Medications:  Allergies as of 04/04/2018   No Known Allergies     Medication List    TAKE these medications   CONCEPT DHA 53.5-38-1 MG Caps Take 1 capsule by mouth daily for 30 days.   docusate sodium 100 MG capsule Commonly known as:  COLACE Take 1 capsule (100 mg total) by mouth daily as needed.   ferrous sulfate 325 (65 FE) MG tablet Take 1 tablet (325 mg total) by mouth 2 (two) times daily with a meal.   ibuprofen 600 MG tablet Commonly known as:  ADVIL,MOTRIN Take 1 tablet (  600 mg total) by mouth every 6 (six) hours.   oxyCODONE 5 MG immediate release tablet Commonly known as:  Oxy IR/ROXICODONE Take 1 tablet (5 mg total) by mouth every 4 (four) hours as needed for up to 7 days for moderate pain or severe pain.       Diet: routine diet  Activity: No heavy lifting x 6 weeks.  Pelvic rest for 6 weeks. No driving x 1-2 weeks. Safety issues regarding anemia discussed  Outpatient follow up: Follow-up Information    Conard Novak, MD. Schedule an appointment as soon as possible for a visit in 1 week(s).   Specialty:  Obstetrics and Gynecology Why:  postop inciscion check Contact information: [address] [place] [address] [phone]             Postpartum contraception: None Rhogam Given postpartum: yes, 04/03/2018 Rubella vaccine given postpartum: no Varicella vaccine given postpartum: no TDaP given antepartum or postpartum: 02/02/2018 Influenza vaccine given:  11/11/2017  Newborn Data: Live born female [place] Birth Weight: 6 lb 9.5 oz (2990 g) APGAR: 8, 9  Newborn Delivery   Birth date/time:  04/02/2018 06:49:00 Delivery type:  C-Section, Low Transverse Trial of labor:  No C-section categorization:  Repeat    Baby Feeding: Bottle  Disposition:home with mother  SIGNED: Farrel Conners, CNM

## 2018-04-02 NOTE — H&P (Signed)
OB History & Physical   History of Present Illness:  Chief Complaint: contractions  HPI:  MAXIMINA DAME is a 28 y.o. G14P0102 female at [redacted]w[redacted]d dated by 6 week ultrasound.  Her pregnancy has been complicated by a history of prior cesarean delivery with her twins.    She reports contractions.   She denies leakage of fluid.   She denies vaginal bleeding.   She reports fetal movement.    Maternal Medical History:   Past Medical History:  Diagnosis Date  . Anxiety   . Depression   . Fatigue   . Mucous in stools   . Type 2 diabetes mellitus (HCC)    pt states she does not have diabetes  . Unintentional weight loss     Past Surgical History:  Procedure Laterality Date  . CESAREAN SECTION    . FINGER SURGERY Right    2/2 trauma    No Known Allergies  Prior to Admission medications   Not on File    OB History  Gravida Para Term Preterm AB Living  2 1 0 1 0 2  SAB TAB Ectopic Multiple Live Births  0 0 0 1 2    # Outcome Date GA Lbr Len/2nd Weight Sex Delivery Anes PTL Lv  2 Current           1A Preterm    1814 g M CS-LTranv  Y LIV  1B Preterm    1361 g M CS-LTranv  Y LIV    Prenatal care site: [facility]  Social History: She  reports that she has never smoked. She has never used smokeless tobacco. She reports previous alcohol use. She reports previous drug use. Drug: Marijuana.  Family History: denies history of gynecologic cancers  Review of Systems: Negative x 10 systems reviewed except as noted in the HPI.    Physical Exam:  Vital Signs: BP 120/67 (BP Location: Left Arm)   Pulse 72   Temp 98.5 F (36.9 C) (Oral)   Resp 18   Ht 5\' 3"  (1.6 m)   Wt 66.7 kg   LMP 06/25/2017   BMI 26.04 kg/m  Constitutional: Well nourished, well developed female in no acute distress.  HEENT: normal Skin: Warm and dry.  Cardiovascular: Regular rate and rhythm.   Extremity: no edema  Respiratory: Clear to auscultation bilateral. Normal respiratory effort Abdomen: FHT  present and gravid/NT Back: no CVAT Neuro: DTRs 2+, Cranial nerves grossly intact Psych: Alert and Oriented x3. No memory deficits. Normal mood and affect.  MS: normal gait, normal bilateral lower extremity ROM/strength/stability.  Pelvic exam: cervix changed from 1-4 cm in 2 hours per RN   Pertinent Results:  Prenatal Labs: Blood type/Rh O negative  Antibody screen negative  Rubella Immune  Varicella Immune    RPR NR  HBsAg negative  HIV negative  GC negative  Chlamydia negative  Genetic screening NIPT, diploid XY  1 hour GTT 104  3 hour GTT n/a  GBS negative on 03/13/2018   Baseline FHR: 140 beats/min   Variability: moderate   Accelerations: present   Decelerations: absent Contractions: present frequency: 3 q 10 min Overall assessment: cat 1   Assessment:  ADIANEZ SIRACUSA is a 28 y.o. G68P0102 female at [redacted]w[redacted]d with active labor, history of cesarean delivery, desires repeat.   Plan:  1. Admit to Labor & Delivery  2. CBC, T&S, NPO, IVF 3. GBS negative.   4. Fetwal well-being: reassuring 5. To OR for urgent cesarean delivery given  rapid progression in labor   Thomasene Mohair, MD 04/02/2018 5:22 AM

## 2018-04-02 NOTE — Anesthesia Procedure Notes (Signed)
Spinal  Patient location during procedure: OB Staffing Performed: anesthesiologist  Preanesthetic Checklist Completed: patient identified, site marked, surgical consent, pre-op evaluation, timeout performed, IV checked, risks and benefits discussed and monitors and equipment checked Spinal Block Patient position: sitting Prep: Betadine Patient monitoring: heart rate, continuous pulse ox, blood pressure and cardiac monitor Approach: midline Location: L4-5 Injection technique: single-shot Needle Needle type: Whitacre and Introducer  Needle gauge: 27 G Needle length: 9 cm Assessment Sensory level: T4 Additional Notes Negative paresthesia. Negative blood return. Positive free-flowing CSF. Expiration date of kit checked and confirmed. Patient tolerated procedure well, without complications.

## 2018-04-02 NOTE — Transfer of Care (Signed)
Immediate Anesthesia Transfer of Care Note  Patient: Jessica Mitchell  Procedure(s) Performed: CESAREAN SECTION (N/A )  Patient Location: PACU  Anesthesia Type:Spinal  Level of Consciousness: awake, alert  and oriented  Airway & Oxygen Therapy: Patient Spontanous Breathing  Post-op Assessment: Report given to RN and Post -op Vital signs reviewed and stable  Post vital signs: Reviewed and stable  Last Vitals:  Vitals Value Taken Time  BP 115/69 04/02/2018  8:01 AM  Temp    Pulse 72 04/02/2018  8:04 AM  Resp 15 04/02/2018  8:04 AM  SpO2 100 % 04/02/2018  8:04 AM  Vitals shown include unvalidated device data.  Last Pain:  Vitals:   04/02/18 0235  TempSrc: Oral  PainSc: 10-Worst pain ever         Complications: No apparent anesthesia complications

## 2018-04-02 NOTE — Op Note (Signed)
Cesarean Section Operative Note    Jessica Mitchell   04/02/2018   Pre-operative Diagnosis:  1) History of prior cesarean delivery, desires repeat 2) intrauterine pregnancy at [redacted]w[redacted]d  3) active labor  Post-operative Diagnosis:  1) History of prior cesarean delivery, desires repeat 2) intrauterine pregnancy at [redacted]w[redacted]d  3) active labor   Procedure: Repeat low transverse cesarean section via pfannenstiel incision  Surgeon: Surgeon(s) and Role:    Conard Novak, MD - Primary   Anesthesia: spinal   Findings:  1) normal appearing gravid uterus, fallopian tubes, and ovaries 2) viable female infant with weight 2,990 grams, and APGARs 8 and 9   Quantified Blood Loss: 722 mL  Total IV Fluids: 1,300 ml   Urine Output: 50 mL clear urine  Specimens: none  Complications: no complications  Disposition: PACU - hemodynamically stable.   Maternal Condition: stable   Baby condition / location:  Couplet care / Skin to Skin  Procedure Details:  The patient was seen in the Holding Room. The risks, benefits, complications, treatment options, and expected outcomes were discussed with the patient. The patient concurred with the proposed plan, giving informed consent. identified as Jessica Mitchell and the procedure verified as C-Section Delivery. A Time Out was held and the above information confirmed.   After induction of anesthesia, the patient was draped and prepped in the usual sterile manner. A Pfannenstiel incision was made and carried down through the subcutaneous tissue to the fascia. Fascial incision was made and extended transversely. The fascia was separated from the underlying rectus tissue superiorly and inferiorly. The peritoneum was identified and entered. Peritoneal incision was extended longitudinally. The bladder flap was bluntly and sharply freed from the lower uterine segment. A low transverse uterine incision was made and the hysterotomy was extended with cranial-caudal  tension. Delivered from cephalic presentation was a 2,990 gram Living newborn infant(s) or Female with Apgar scores of 8 at one minute and 9 at five minutes. Cord ph was not sent the umbilical cord was clamped and cut cord blood was obtained for evaluation. The placenta was removed Intact and appeared normal. The uterine outline, tubes and ovaries appeared normal. The uterine incision was closed with running locked sutures of 0 Vicryl.  A second layer of the same suture was thrown in an imbricating fashion.  Several areas of oozing were sutured to obtain hemostasis. Hemostasis was assured all along the incision.  The uterus was returned to the abdomen and the paracolic gutters were cleared of all clots and debris.  The rectus muscles were inspected and found to be hemostatic.  The On-Q catheter pumps were inserted in accordance with the manufacturer's recommendations.  The catheters were inserted approximately 4cm cephelad to the incision line, approximately 1cm apart, straddling the midline.  They were inserted to a depth of the 4th mark. They were positioned superficial to the rectus abdominus muscles and deep to the rectus fascia.    The fascia was then reapproximated with running sutures of 1-0 PDS, looped. The subcuticular closure was performed using 4-0 monocryl. The skin closure was reinforced using benzoin and 1/2" steri-strips.  The On-Q catheters were bolused with 5 mL of 0.5% marcaine plain for a total of 10 mL.  The catheters were affixed to the skin with surgical skin glue, steri-strips, and tegaderm.    Instrument, sponge, and needle counts were correct prior the abdominal closure and were correct at the conclusion of the case.  The patient received Ancef 2 gram  IV prior to skin incision (within 30 minutes). For VTE prophylaxis she was wearing SCDs throughout the case.    Signed: Conard NovakStephen D. Jaliza Seifried, MD 04/02/2018 7:47 AM

## 2018-04-03 ENCOUNTER — Encounter: Payer: Self-pay | Admitting: Obstetrics and Gynecology

## 2018-04-03 ENCOUNTER — Inpatient Hospital Stay: Admission: RE | Admit: 2018-04-03 | Payer: Medicaid Other | Source: Ambulatory Visit

## 2018-04-03 ENCOUNTER — Encounter: Payer: Medicaid Other | Admitting: Obstetrics & Gynecology

## 2018-04-03 LAB — TYPE AND SCREEN
ABO/RH(D): O NEG
Antibody Screen: POSITIVE
Unit division: 0
Unit division: 0

## 2018-04-03 LAB — BPAM RBC
Blood Product Expiration Date: 202003102359
Blood Product Expiration Date: 202003122359
Unit Type and Rh: 9500
Unit Type and Rh: 9500

## 2018-04-03 LAB — CBC
HCT: 26.3 % — ABNORMAL LOW (ref 36.0–46.0)
Hemoglobin: 8.2 g/dL — ABNORMAL LOW (ref 12.0–15.0)
MCH: 27.1 pg (ref 26.0–34.0)
MCHC: 31.2 g/dL (ref 30.0–36.0)
MCV: 86.8 fL (ref 80.0–100.0)
Platelets: 127 10*3/uL — ABNORMAL LOW (ref 150–400)
RBC: 3.03 MIL/uL — ABNORMAL LOW (ref 3.87–5.11)
RDW: 14 % (ref 11.5–15.5)
WBC: 14.6 10*3/uL — ABNORMAL HIGH (ref 4.0–10.5)
nRBC: 0 % (ref 0.0–0.2)

## 2018-04-03 LAB — FETAL SCREEN: Fetal Screen: NEGATIVE

## 2018-04-03 MED ORDER — RHO D IMMUNE GLOBULIN 1500 UNIT/2ML IJ SOSY
300.0000 ug | PREFILLED_SYRINGE | Freq: Once | INTRAMUSCULAR | Status: AC
  Administered 2018-04-03: 300 ug via INTRAVENOUS
  Filled 2018-04-03: qty 2

## 2018-04-03 MED ORDER — OXYCODONE HCL 5 MG PO TABS
5.0000 mg | ORAL_TABLET | ORAL | Status: DC | PRN
  Administered 2018-04-03: 5 mg via ORAL
  Administered 2018-04-04: 10 mg via ORAL
  Filled 2018-04-03: qty 2
  Filled 2018-04-03: qty 1

## 2018-04-03 MED ORDER — ACETAMINOPHEN 500 MG PO TABS
1000.0000 mg | ORAL_TABLET | Freq: Four times a day (QID) | ORAL | Status: DC | PRN
  Administered 2018-04-03: 1000 mg via ORAL
  Filled 2018-04-03: qty 2

## 2018-04-03 NOTE — Anesthesia Post-op Follow-up Note (Signed)
  Anesthesia Pain Follow-up Note  Patient: Jessica Mitchell  Day #: 1  Date of Follow-up: 04/03/2018 Time: 7:35 AM  Last Vitals:  Vitals:   04/03/18 0401 04/03/18 0500  BP: 97/63   Pulse: 65 64  Resp: 20   Temp: 36.7 C   SpO2: 99% 95%    Level of Consciousness: alert  Pain: mild   Side Effects:None  Catheter Site Exam:clean     Plan: D/C from anesthesia care at surgeon's request  Jules Schick

## 2018-04-03 NOTE — Clinical Social Work Maternal (Signed)
  CLINICAL SOCIAL WORK MATERNAL/CHILD NOTE  Patient Details  Name: Jessica Mitchell MRN: [id number] Date of Birth: Dec 13, 1990  Date:  04/03/2018  Clinical Social Worker Initiating Note:  York Spaniel MSW,LCSW Date/Time: Initiated:  04/03/18/      Child's Name:      Biological Parents:  Mother, Father   Need for Interpreter:  None   Reason for Referral:  Current Substance Use/Substance Use During Pregnancy    Address:  [address] [place] [address]    Phone number:  [phone] (home)     Additional phone number: none  Household Members/Support Persons (HM/SP):       HM/SP Name Relationship DOB or Age  HM/SP -1        HM/SP -2        HM/SP -3        HM/SP -4        HM/SP -5        HM/SP -6        HM/SP -7        HM/SP -8          Natural Supports (not living in the home):  [employer], Extended Family   Professional Supports: None   Employment:     Type of Work:     Education:      Homebound arranged:    Surveyor, quantity Resources:  Medicaid   Other Resources:      Cultural/Religious Considerations Which May Impact Care:  none  Strengths:  Ability to meet basic needs , Home prepared for child    Psychotropic Medications:         Pediatrician:       Pediatrician List:   [employer] [place]      Pediatrician Fax Number:    Risk Factors/Current Problems:  None   Cognitive State:  Alert , Able to Concentrate , Goal Oriented    Mood/Affect:  Calm , Comfortable , Happy    CSW Assessment: CSW spoke with patient and her husband this afternoon. Patient stated that her family could also remain in the room during assessment. Patient informed CSW that she has all supplies for her newborn. She stated in the home will be her set of 30 year old twins and her husband. She stated that she did have postpartum depression after her twins but that she did not need  medication and is not on any anti-depressant medication. Patient and family are aware of signs and symptoms to look for and patient knows to reach out to her physician should she need to. In reference to her marijuana use, she stated it was before she knew she was pregnant and hasn't used since she was told. CSW explained that both urine drug screen tests for her and baby were negative but that the cord tissue is pending. Patient is aware that if that returns positive, then a DSS CPS report will need to be made. Patient asked appropriate questions and did not have any further concerns.   CSW Plan/Description:  CSW Will Continue to Monitor Umbilical Cord Tissue Drug Screen Results and Make Report if [facility], [place] 04/03/2018, 2:39 PM

## 2018-04-03 NOTE — Anesthesia Postprocedure Evaluation (Signed)
Anesthesia Post Note  Patient: Jessica Mitchell  Procedure(s) Performed: CESAREAN SECTION (N/A )  Patient location during evaluation: Mother Baby Anesthesia Type: Spinal Level of consciousness: awake and alert Pain management: pain level controlled Vital Signs Assessment: post-procedure vital signs reviewed and stable Respiratory status: spontaneous breathing, nonlabored ventilation and respiratory function stable Cardiovascular status: stable Postop Assessment: no headache, no backache and epidural receding Anesthetic complications: no     Last Vitals:  Vitals:   04/03/18 0401 04/03/18 0500  BP: 97/63   Pulse: 65 64  Resp: 20   Temp: 36.7 C   SpO2: 99% 95%    Last Pain:  Vitals:   04/03/18 0248  TempSrc:   PainSc: 0-No pain                 Mathews Argyle P

## 2018-04-03 NOTE — Progress Notes (Addendum)
Subjective:  Doing well no concerns.  Pain well controlled on oral analgesic.  Ambulating.  Tolerating po.  Minimal lochia  Objective:  Vital signs in last 24 hours: Temp:  [97.6 F (36.4 C)-98.9 F (37.2 C)] 97.6 F (36.4 C) (02/10 0745) Pulse Rate:  [55-84] 60 (02/10 0745) Resp:  [15-20] 15 (02/10 0745) BP: (97-116)/(48-71) 116/69 (02/10 0745) SpO2:  [95 %-100 %] 96 % (02/10 0700)    Intake/Output      02/09 0701 - 02/10 0700 02/10 0701 - 02/11 0700   P.O. 720 240   I.V. (mL/kg) 2228 (33.4)    Total Intake(mL/kg) 2948 (44.2) 240 (3.6)   Urine (mL/kg/hr) 1600 (1)    Blood 1567    Total Output 3167    Net -219 +240        Urine Occurrence  2 x     General: NAD Pulmonary: no increased work of breathing Abdomen: non-distended, non-tender, fundus firm at level of umbilicus Incision: D/C/I pressure dressing Extremities: no edema, no erythema, no tenderness  Results for orders placed or performed during the hospital encounter of 04/02/18 (from the past 72 hour(s))  CBC     Status: Abnormal   Collection Time: 04/02/18  5:50 AM  Result Value Ref Range   WBC 17.6 (H) 4.0 - 10.5 K/uL   RBC 4.29 3.87 - 5.11 MIL/uL   Hemoglobin 11.9 (L) 12.0 - 15.0 g/dL   HCT 16.1 09.6 - 04.5 %   MCV 87.6 80.0 - 100.0 fL   MCH 27.7 26.0 - 34.0 pg   MCHC 31.6 30.0 - 36.0 g/dL   RDW 40.9 81.1 - 91.4 %   Platelets 172 150 - 400 K/uL   nRBC 0.0 0.0 - 0.2 %    Comment: Performed at [facility], [address] [place], [place] [address]  Rapid HIV screen (HIV 1/2 Ab+Ag)     Status: None   Collection Time: 04/02/18  5:50 AM  Result Value Ref Range   HIV-1 P24 Antigen - HIV24 NON REACTIVE NON REACTIVE   HIV 1/2 Antibodies NON REACTIVE NON REACTIVE   Interpretation (HIV Ag Ab)      A non reactive test result means that HIV 1 or HIV 2 antibodies and HIV 1 p24 antigen were not detected in the specimen.    Comment: Performed at [facility], [address] [place],  [place] [address]  Type and screen     Status: None   Collection Time: 04/02/18  8:01 AM  Result Value Ref Range   ABO/RH(D) O NEG    Antibody Screen POS    Sample Expiration 04/05/2018    Antibody Identification PASSIVELY ACQUIRED ANTI-D    Unit Number [id number]    Blood Component Type RED CELLS,LR    Unit division 00    Status of Unit REL FROM [facility]    Transfusion Status OK TO TRANSFUSE    Crossmatch Result      COMPATIBLE Performed at [facility], [address] [place], [place] [address]    Unit Number [id number]    Blood Component Type RED CELLS,LR    Unit division 00    Status of Unit REL FROM [facility]    Transfusion Status OK TO TRANSFUSE    Crossmatch Result COMPATIBLE   Fetal screen     Status: None   Collection Time: 04/03/18  4:15 AM  Result Value Ref Range   Fetal Screen      NEG Performed at  [facility], [address] [place], [place] [address]   Rhogam injection     Status: None (Preliminary result)   Collection Time: 04/03/18  4:15 AM  Result Value Ref Range   Unit Number [id number]    Blood Component Type RHIG    Unit division 00    Status of Unit ISSUED    Transfusion Status OK TO TRANSFUSE   CBC     Status: Abnormal   Collection Time: 04/03/18  4:15 AM  Result Value Ref Range   WBC 14.6 (H) 4.0 - 10.5 K/uL   RBC 3.03 (L) 3.87 - 5.11 MIL/uL   Hemoglobin 8.2 (L) 12.0 - 15.0 g/dL    Comment: REPEATED TO VERIFY   HCT 26.3 (L) 36.0 - 46.0 %   MCV 86.8 80.0 - 100.0 fL   MCH 27.1 26.0 - 34.0 pg   MCHC 31.2 30.0 - 36.0 g/dL   RDW 47.814.0 29.511.5 - 62.115.5 %   Platelets 127 (L) 150 - 400 K/uL    Comment: Immature Platelet Fraction may be clinically indicated, consider ordering this additional test [id number]    nRBC 0.0 0.0 - 0.2 %    Comment: Performed at [facility], [address] [place], [place] [address]    Immunization History  Administered Date(s) Administered  . Influenza,inj,Quad PF,6+ Mos 11/11/2017  . Tdap  02/02/2018    Assessment:   28 y.o. G2P1103 postoperativeday # 1 RLTCS   Plan:  ) Acute blood loss anemia - hemodynamically stable and asymptomatic - po ferrous sulfate  2) Blood Type --/--/O NEG (02/09 0801) / Rubella 4.15 (06/19 1130) / Varicella Immune -  Information for the patient's newborn:  Margo AyeSmith, Boy Chene [[id number]][[id number]]  O POS   3) TDAP status up to date  4) Feeding plan bottle  5)  Education given regarding options for contraception, as well as compatibility with breast feeding if applicable.  Patient plans on none for contraception.  6) Disposition - anticipate discharge POD 2-3  Vena AustriaAndreas Korinne Greenstein, MD, Merlinda FrederickFACOG [facility], [facility] 04/03/2018, 11:27 AM

## 2018-04-04 ENCOUNTER — Inpatient Hospital Stay
Admission: RE | Admit: 2018-04-04 | Payer: Medicaid Other | Source: Home / Self Care | Admitting: Obstetrics & Gynecology

## 2018-04-04 DIAGNOSIS — D62 Acute posthemorrhagic anemia: Secondary | ICD-10-CM | POA: Diagnosis not present

## 2018-04-04 DIAGNOSIS — O34219 Maternal care for unspecified type scar from previous cesarean delivery: Secondary | ICD-10-CM | POA: Diagnosis not present

## 2018-04-04 LAB — RHOGAM INJECTION: Unit division: 0

## 2018-04-04 LAB — RPR: RPR Ser Ql: NONREACTIVE

## 2018-04-04 SURGERY — Surgical Case
Anesthesia: Monitor Anesthesia Care

## 2018-04-04 MED ORDER — FERROUS SULFATE 325 (65 FE) MG PO TABS: 325.0000 mg | ORAL_TABLET | Freq: Two times a day (BID) | ORAL | 1 refills | Status: DC

## 2018-04-04 MED ORDER — OXYCODONE HCL 5 MG PO TABS: 5.0000 mg | ORAL_TABLET | ORAL | 0 refills | Status: AC | PRN

## 2018-04-04 MED ORDER — IBUPROFEN 600 MG PO TABS: 600.0000 mg | ORAL_TABLET | Freq: Four times a day (QID) | ORAL | 0 refills | Status: DC

## 2018-04-04 MED ORDER — CONCEPT DHA 53.5-38-1 MG PO CAPS: 1.0000 | ORAL_CAPSULE | Freq: Every day | ORAL | 11 refills | Status: AC

## 2018-04-04 MED ORDER — DOCUSATE SODIUM 100 MG PO CAPS: 100.0000 mg | ORAL_CAPSULE | Freq: Every day | ORAL | 2 refills | Status: AC | PRN

## 2018-04-04 NOTE — Progress Notes (Signed)
Dc to home with NB to car via Insurance underwriter.

## 2018-04-04 NOTE — Progress Notes (Signed)
DC inst reviewed with pt and FOB.  Questions answered.  Inst given on removing ON Q pump by Friday.  Pt verb u/o.

## 2018-04-05 ENCOUNTER — Encounter: Payer: Self-pay | Admitting: Obstetrics and Gynecology

## 2018-04-05 ENCOUNTER — Ambulatory Visit (INDEPENDENT_AMBULATORY_CARE_PROVIDER_SITE_OTHER): Payer: Medicaid Other | Admitting: Obstetrics and Gynecology

## 2018-04-05 VITALS — BP 136/71 | HR 78 | Wt 142.0 lb

## 2018-04-05 DIAGNOSIS — Z98891 History of uterine scar from previous surgery: Secondary | ICD-10-CM

## 2018-04-05 DIAGNOSIS — Z09 Encounter for follow-up examination after completed treatment for conditions other than malignant neoplasm: Secondary | ICD-10-CM

## 2018-04-05 NOTE — Progress Notes (Signed)
  Postoperative Follow-up Patient presents post op from cesarean section  3 days ago.  Subjective: She denies fever, chills, nausea and vomiting. Eating a regular diet without difficulty. Pain is controlled with current analgesics. Medications being used: prescription NSAID's including ibuprofen (Motrin) and narcotic analgesics including Percocet.  Activity: increasing slowly. She denies issues with her incision.  She notes leaking from her OnQ site.  She removed her OnQ POD#2 and had red-tinged leakage from the incision site.  She states the "bleeding" has become lighter since yesterday  Objective: BP 136/71 (BP Location: Left Arm, Patient Position: Sitting)   Pulse 78   Wt 142 lb (64.4 kg)   LMP 06/25/2017   BMI 25.15 kg/m   Constitutional: Well nourished, well developed female in no acute distress.  HEENT: normal Skin: Warm and dry.  Abdomen: s/nt/nd, uterus at U-2 clean, dry, intact and without erythema, induration, warmth, and tenderness   OnQ site hemostatic. It is cleaned and prepped and then Dermabond is applied to stop leakage.  Extremity: no edema   Assessment: 28 y.o. s/p cesarean section progressing well  Plan: Patient has done well after surgery with no apparent complications.  I have discussed the post-operative course to date, and the expected progress moving forward.  The patient understands what complications to be concerned about.    Activity plan: No restriction.  Discussed normal symptoms after removal of OnQ pump. No evidence of infection today.  Follow up appointment next week and again in 5 weeks.  Return in about 5 weeks (around 05/10/2018) for Six Week Postpartum.  Thomasene Mohair, MD 04/05/2018 10:01 AM

## 2018-04-18 ENCOUNTER — Ambulatory Visit (INDEPENDENT_AMBULATORY_CARE_PROVIDER_SITE_OTHER): Payer: Medicaid Other | Admitting: Obstetrics and Gynecology

## 2018-04-18 ENCOUNTER — Telehealth: Payer: Self-pay | Admitting: Obstetrics and Gynecology

## 2018-04-18 ENCOUNTER — Encounter: Payer: Self-pay | Admitting: Obstetrics and Gynecology

## 2018-04-18 VITALS — BP 118/74 | Wt 136.0 lb

## 2018-04-18 DIAGNOSIS — Z09 Encounter for follow-up examination after completed treatment for conditions other than malignant neoplasm: Secondary | ICD-10-CM

## 2018-04-18 DIAGNOSIS — Z9889 Other specified postprocedural states: Secondary | ICD-10-CM

## 2018-04-18 LAB — POCT HEMOGLOBIN: Hemoglobin: 13.4 g/dL (ref 11–14.6)

## 2018-04-18 NOTE — Progress Notes (Signed)
   Postoperative Follow-up Patient presents post op from cesarean section  2 weeks ago.  Subjective: She denies fever, chills, nausea and vomiting. Eating a regular diet without difficulty. The patient is not having any pain.  Activity: increasing slowly. She denies issues with her incision.    Objective: BP 118/74   Wt 136 lb (61.7 kg)   LMP 06/25/2017   BMI 24.09 kg/m   Constitutional: Well nourished, well developed female in no acute distress.  HEENT: normal Skin: Warm and dry.  Abdomen: s/nt/nd/+BS, uterine fundus at U-4 clean, dry, intact and without erythema, induration, warmth, and tenderness Extremity: no edema   POCT Hemoglobin: 13.4 mg/dL  Assessment: 28 y.o. s/p cesarean section progressing well  Plan: Patient has done well after surgery with no apparent complications.  I have discussed the post-operative course to date, and the expected progress moving forward.  The patient understands what complications to be concerned about.    Activity plan: increase slowly  Return in about 4 weeks (around 05/16/2018) for Six Week Postpartum with Nexplanon insertion.  Thomasene Mohair, MD 04/18/2018 4:24 PM

## 2018-04-18 NOTE — Telephone Encounter (Signed)
Patient scheduled 3/24 for nexplanon insertion with SDJ

## 2018-05-15 NOTE — Telephone Encounter (Signed)
Patient has reschedule to 05/22/18 with SDJ for nexplanon insertion

## 2018-05-16 ENCOUNTER — Ambulatory Visit: Payer: Medicaid Other | Admitting: Obstetrics and Gynecology

## 2018-05-22 ENCOUNTER — Ambulatory Visit: Payer: Medicaid Other | Admitting: Obstetrics and Gynecology

## 2018-05-23 NOTE — Telephone Encounter (Addendum)
Pt r/s to 05/25/2018. Nexplanon reserved for this patient.

## 2018-05-25 ENCOUNTER — Ambulatory Visit: Payer: Medicaid Other | Admitting: Obstetrics and Gynecology

## 2018-05-29 ENCOUNTER — Other Ambulatory Visit: Payer: Self-pay

## 2018-05-29 ENCOUNTER — Ambulatory Visit: Payer: Medicaid Other | Admitting: Obstetrics and Gynecology

## 2018-05-29 ENCOUNTER — Encounter: Payer: Self-pay | Admitting: Obstetrics and Gynecology

## 2018-05-29 ENCOUNTER — Encounter: Payer: Medicaid Other | Admitting: Obstetrics and Gynecology

## 2018-05-29 NOTE — Progress Notes (Signed)
Entered in error

## 2018-05-30 ENCOUNTER — Encounter: Payer: Self-pay | Admitting: Obstetrics and Gynecology

## 2018-05-30 ENCOUNTER — Ambulatory Visit (INDEPENDENT_AMBULATORY_CARE_PROVIDER_SITE_OTHER): Payer: Self-pay | Admitting: Obstetrics and Gynecology

## 2018-05-30 ENCOUNTER — Other Ambulatory Visit: Payer: Self-pay

## 2018-05-30 NOTE — Progress Notes (Signed)
Postpartum Visit   Chief Complaint  Patient presents with  . Postpartum Care    History of Present Illness: Patient is a 28 y.o. [id number] presents for postpartum visit.  Date of delivery: 04/02/2018 Type of delivery: C-section Episiotomy No.  Pregnancy or labor problems:  no Any problems since the delivery:  no  Newborn Details:  SINGLETON :  1. Baby's name: Quinton. Birth weight: 6 lb 9.5 oz (2,990 grams) Maternal Details:  Breast Feeding:  no Post partum depression/anxiety noted:  no Edinburgh Post-Partum Depression Score:  0  Date of last PAP: 07/31/2017  normal   Past Medical History:  Diagnosis Date  . Anxiety   . Depression   . Fatigue   . Mucous in stools   . Type 2 diabetes mellitus (HCC)    pt states she does not have diabetes  . Unintentional weight loss     Past Surgical History:  Procedure Laterality Date  . CESAREAN SECTION    . CESAREAN SECTION N/A 04/02/2018   Procedure: CESAREAN SECTION;  Surgeon: Conard NovakJackson, Marna Weniger D, MD;  Location: [facility] ORS;  Service: Obstetrics;  Laterality: N/A;  . FINGER SURGERY Right    2/2 trauma    Prior to Admission medications   Medication Sig Start Date End Date Taking? Authorizing Provider  docusate sodium (COLACE) 100 MG capsule Take 1 capsule (100 mg total) by mouth daily as needed. 04/04/18 04/04/19 Yes Farrel ConnersGutierrez, Colleen, CNM  ferrous sulfate 325 (65 FE) MG tablet Take 1 tablet (325 mg total) by mouth 2 (two) times daily with a meal. 04/04/18  Yes Farrel ConnersGutierrez, Colleen, CNM  ibuprofen (ADVIL,MOTRIN) 600 MG tablet Take 1 tablet (600 mg total) by mouth every 6 (six) hours. 04/04/18  Yes Farrel ConnersGutierrez, Colleen, CNM  Prenatal Vit-Fe Fumarate-FA (MULTIVITAMIN-PRENATAL) 27-0.8 MG TABS tablet Take 1 tablet by mouth daily at 12 noon.   Yes [provider]    No Known Allergies   Social History   Socioeconomic History  . Marital status: Married    Spouse name: Not on file  . Number of children: Not on file  . Years of  education: Not on file  . Highest education level: Not on file  Occupational History  . Not on file  Social Needs  . Financial resource strain: Not on file  . Food insecurity:    Worry: Not on file    Inability: Not on file  . Transportation needs:    Medical: Not on file    Non-medical: Not on file  Tobacco Use  . Smoking status: Never Smoker  . Smokeless tobacco: Never Used  Substance and Sexual Activity  . Alcohol use: Not Currently  . Drug use: Not Currently    Types: Marijuana  . Sexual activity: Not Currently    Birth control/protection: None  Lifestyle  . Physical activity:    Days per week: Not on file    Minutes per session: Not on file  . Stress: Not on file  Relationships  . Social connections:    Talks on phone: Not on file    Gets together: Not on file    Attends religious service: Not on file    Active member of club or organization: Not on file    Attends meetings of clubs or organizations: Not on file    Relationship status: Not on file  . Intimate partner violence:    Fear of current or ex partner: Not on file    Emotionally abused: Not on file  Physically abused: Not on file    Forced sexual activity: Not on file  Other Topics Concern  . Not on file  Social History Narrative  . Not on file   Family History: denies history of gynecologic cancer  Review of Systems  Constitutional: Negative.   HENT: Negative.   Eyes: Negative.   Respiratory: Negative.   Cardiovascular: Negative.   Gastrointestinal: Negative.   Genitourinary: Negative.   Musculoskeletal: Negative.   Skin: Negative.   Neurological: Negative.   Psychiatric/Behavioral: Negative.      Physical Exam BP 120/70   Ht 5\' 3"  (1.6 m)   Wt 131 lb (59.4 kg)   LMP 05/20/2018   BMI 23.21 kg/m   Physical Exam Constitutional:      General: She is not in acute distress.    Appearance: Normal appearance. She is well-developed.  Genitourinary:     Pelvic exam was performed with  patient supine.     Vulva, inguinal canal, urethra, bladder, vagina, uterus, right adnexa and left adnexa normal.     No posterior fourchette tenderness, injury or lesion present.     No cervical friability, lesion, bleeding or polyp.  HENT:     Head: Normocephalic and atraumatic.  Eyes:     General: No scleral icterus.    Conjunctiva/sclera: Conjunctivae normal.  Neck:     Musculoskeletal: Normal range of motion and neck supple.  Cardiovascular:     Rate and Rhythm: Normal rate and regular rhythm.     Heart sounds: No murmur. No friction rub. No gallop.   Pulmonary:     Effort: Pulmonary effort is normal. No respiratory distress.     Breath sounds: Normal breath sounds. No wheezing or rales.  Abdominal:     General: Bowel sounds are normal. There is no distension.     Palpations: Abdomen is soft. There is no mass.     Tenderness: There is no abdominal tenderness. There is no guarding or rebound.     Comments: without erythema, induration, warmth, and tenderness. It is clean, dry, and intact.    Musculoskeletal: Normal range of motion.  Neurological:     General: No focal deficit present.     Mental Status: She is alert and oriented to person, place, and time.     Cranial Nerves: No cranial nerve deficit.  Skin:    General: Skin is warm and dry.     Findings: No erythema.  Psychiatric:        Mood and Affect: Mood normal.        Behavior: Behavior normal.        Judgment: Judgment normal.      Female Chaperone present during breast and/or pelvic exam.  Assessment: 28 y.o. G2P1103 presenting for 6 week postpartum visit  Plan: Problem List Items Addressed This Visit      Other   Postpartum care following cesarean delivery - Primary       1) Contraception Education given regarding options for contraception, including barrier methods.  2)  Pap - ASCCP guidelines and rational discussed.  Patient opts for routine screening interval. Up to date  3) Patient underwent  screening for postpartum depression with no concerns noted.  4) Follow up 1 year for routine annual exam  Thomasene Mohair, MD 05/30/2018 3:02 PM

## 2019-03-02 ENCOUNTER — Ambulatory Visit: Payer: Self-pay

## 2019-03-29 DIAGNOSIS — Z8742 Personal history of other diseases of the female genital tract: Secondary | ICD-10-CM

## 2019-03-30 ENCOUNTER — Ambulatory Visit: Payer: Medicaid Other

## 2019-04-26 ENCOUNTER — Ambulatory Visit: Payer: Medicaid Other

## 2019-05-04 ENCOUNTER — Ambulatory Visit: Payer: Medicaid Other

## 2020-01-07 ENCOUNTER — Telehealth: Payer: Self-pay | Admitting: Family Medicine

## 2020-01-07 NOTE — Telephone Encounter (Signed)
Consulted by RN re: patient symptoms and concerns.  Reviewed RN note and agree that it reflects our discussion and my recommendations.

## 2020-01-07 NOTE — Telephone Encounter (Signed)
This RN returned pt's call at phone number provided. Pt reports that she has a physical scheduled for tomorrow but is wanting to know if we would be able to help her with a "hair bump" that she feels like has developed into a cyst. Pt reports that it has been going on for a few months and swells up and gets red and tender and then drains. Pt reports that she feels like it drained last night and is just hard now, a little red and tender. Pt also reports that she feels like she may have a yeast infection or a UTI and that she's been having some discharge, burning with urination, a mild odor and a little bit of lower abdominal pain that comes and goes. Pt denies any fever, any severe pain or other symptoms. Counseled pt that we don't typically test or treat UTI's, but that we do testing and treatment for STI's and for things like yeast infections, and that I will consult with a provider about her symptoms. Consulted with Sadie Haber, PA regarding pt's concerns and symptoms and per Sadie Haber, PA we are unable to treatment for cysts (incision and draining) or for UTI's and that pt may need to go to an Urgent Care or ED if she does feel like it's a cysts or a UTI. Counseled pt per Sadie Haber, PA and pt states understanding. Pt states she plans to keep her physical appt tomorrow and then get treatment elsewhere for her other concerns. Pt with no further questions or concerns at this time.

## 2020-01-08 ENCOUNTER — Encounter: Payer: Self-pay | Admitting: Physician Assistant

## 2020-01-08 ENCOUNTER — Ambulatory Visit (LOCAL_COMMUNITY_HEALTH_CENTER): Payer: Medicaid Other | Admitting: Physician Assistant

## 2020-01-08 ENCOUNTER — Other Ambulatory Visit: Payer: Self-pay

## 2020-01-08 VITALS — BP 118/73 | Ht 64.0 in | Wt 110.0 lb

## 2020-01-08 DIAGNOSIS — F419 Anxiety disorder, unspecified: Secondary | ICD-10-CM

## 2020-01-08 DIAGNOSIS — Z0000 Encounter for general adult medical examination without abnormal findings: Secondary | ICD-10-CM

## 2020-01-08 DIAGNOSIS — F32A Depression, unspecified: Secondary | ICD-10-CM

## 2020-01-08 DIAGNOSIS — Z3009 Encounter for other general counseling and advice on contraception: Secondary | ICD-10-CM

## 2020-01-08 DIAGNOSIS — Z113 Encounter for screening for infections with a predominantly sexual mode of transmission: Secondary | ICD-10-CM

## 2020-01-08 LAB — WET PREP FOR TRICH, YEAST, CLUE
Trichomonas Exam: NEGATIVE
Yeast Exam: NEGATIVE

## 2020-01-08 NOTE — Progress Notes (Signed)
Pt is here for physical and STD screen. Pt reports having issues with yeast and UTI's, and is concerned about a cyst that she has. Pt is desiring pregnancy and does not want a BCM at this time.

## 2020-01-08 NOTE — Progress Notes (Signed)
Wet mount reviewed with provider and no treatment needed for wet mount per standing order and per provider verbal order. Awaiting further TR's. Pt received Kathreen Cosier, LCSW card, and [employer] card with contact info per pt request and provider order. Pt desiring pregnancy at this time, but pt aware that if she changes her mind or desires a BCM later on to give [place] a call so she can RTC. Counseled pt per provider orders and pt states understanding. Provider orders completed.

## 2020-01-09 ENCOUNTER — Encounter: Payer: Self-pay | Admitting: Physician Assistant

## 2020-01-09 NOTE — Progress Notes (Signed)
[facility] DEPARTMENT [facility] [address] Main Number: [phone]    Family Planning Visit- Initial Visit  Subjective:  Jessica Mitchell is a 29 y.o.  [phone]   being seen today for an initial well woman visit and to discuss family planning options.  She is currently using None for pregnancy prevention. Patient reports she does want a pregnancy in the next year.  Patient has the following medical conditions has Depression; Anxiety; History of cesarean section; Pregnancy with suprapubic pain, antepartum; Hx of preterm delivery, currently pregnant, third trimester; History of cesarean delivery; Acute blood loss anemia; Delivered by cesarean delivery following previous cesarean delivery; Postpartum care following cesarean delivery; and History of abnormal cervical Pap smear on their problem list.  Chief Complaint  Patient presents with  . Contraception    Physical and STD check    Patient reports that she would like to get pregnant within the next year.  States that she has a history of Anxiety and Depression and planning to get back into therapy but has to straighten out her insurance.  In the interim, she has a Education officer, environmental that she talks with and support from her husband.  Declines referral to LCSW today but states that she will accept her card and [employer] card if something changes.  Reports that she has had white vaginal discharge with slight odor for 3 weeks and dysuria for 4 days.  Reports that she has an appointment with her PCP for evaluation for UTI later this week.  Also, has a place in her pubic area that started as a hair bump, but has become a cyst that she wants to know how to get resolved. Per chart review, patient will be due CBE and pap in 2022.   Patient denies other concerns today.    Body mass index is 18.88 kg/m. - Patient is eligible for diabetes screening based on BMI and age >78?  not applicable HA1C ordered? not  applicable  Patient reports 1 partner in last year. Desires STI screening?  Yes  Has patient been screened once for HCV in the past?  No  No results found for: HCVAB  Does the patient have current drug use (including MJ), have a partner with drug use, and/or has been incarcerated since last result? No  If yes-- Screen for HCV through [facility]   Does the patient meet criteria for HBV testing? No  Criteria:  -Household, sexual or needle sharing contact with HBV -History of drug use -HIV positive -Those with known Hep C   Health Maintenance Due  Topic Date Due  . Hepatitis C Screening  Never done  . PNEUMOCOCCAL POLYSACCHARIDE VACCINE AGE 36-64 HIGH RISK  Never done  . FOOT EXAM  Never done  . OPHTHALMOLOGY EXAM  Never done  . URINE MICROALBUMIN  Never done  . COVID-19 Vaccine (1) Never done  . HEMOGLOBIN A1C  02/09/2018  . INFLUENZA VACCINE  09/23/2019    Review of Systems  All other systems reviewed and are negative.   The following portions of the patient's history were reviewed and updated as appropriate: allergies, current medications, past family history, past medical history, past social history, past surgical history and problem list. Problem list updated.   See flowsheet for other program required questions.  Objective:   Vitals:   01/08/20 0900  BP: 118/73  Weight: 110 lb (49.9 kg)  Height: 5\' 4"  (1.626 m)    Physical Exam Vitals and nursing  note reviewed.  Constitutional:      General: She is not in acute distress.    Appearance: Normal appearance.  HENT:     Head: Normocephalic and atraumatic.     Mouth/Throat:     Mouth: Mucous membranes are moist.     Pharynx: Oropharynx is clear. No oropharyngeal exudate or posterior oropharyngeal erythema.  Eyes:     Conjunctiva/sclera: Conjunctivae normal.  Neck:     Thyroid: No thyroid mass, thyromegaly or thyroid tenderness.  Cardiovascular:     Rate and Rhythm: Normal rate and regular rhythm.   Pulmonary:     Effort: Pulmonary effort is normal.     Breath sounds: Normal breath sounds.  Abdominal:     Palpations: Abdomen is soft. There is no mass.     Tenderness: There is no abdominal tenderness. There is no guarding or rebound.  Genitourinary:    General: Normal vulva.     Rectum: Normal.     Comments: External genitalia/pubic area without nits, lice, edema, erythema, lesions and inguinal adenopathy. On right side of pubic area 1 ~2cm firm, hyperpigmented slightly tender area, no exudate or fluctuance. Vagina with normal mucosa and discharge. Cervix without visible lesions. Uterus firm, mobile, nt, no masses, no CMT, no adnexal tenderness or fullness. Musculoskeletal:     Cervical back: Neck supple. No tenderness.  Lymphadenopathy:     Cervical: No cervical adenopathy.  Skin:    General: Skin is warm and dry.     Findings: No bruising, erythema, lesion or rash.  Neurological:     Mental Status: She is alert and oriented to person, place, and time.  Psychiatric:        Mood and Affect: Mood normal.        Behavior: Behavior normal.        Thought Content: Thought content normal.        Judgment: Judgment normal.       Assessment and Plan:  Jessica Mitchell is a 29 y.o. female presenting to the [facility] Department for an initial well woman exam/family planning visit  Contraception counseling: Reviewed all forms of birth control options in the tiered based approach. available including abstinence; over the counter/barrier methods; hormonal contraceptive medication including pill, patch, ring, injection,contraceptive implant, ECP; hormonal and nonhormonal IUDs; permanent sterilization options including vasectomy and the various tubal sterilization modalities. Risks, benefits, and typical effectiveness rates were reviewed.  Questions were answered.  Written information was also given to the patient to review.  Patient desires to achieve pregnancy. She will  follow up in  1 year and prn for surveillance.  She was told to call with any further questions, or with any concerns about this method of contraception.  Emphasized use of condoms 100% of the time for STI prevention.  Patient was not a candidate for ECP today.   1. Encounter for counseling regarding contraception Counseled patient re:  BCMs and that she can RTC if she changes her mind about pregnancy.  2. Screening for STD (sexually transmitted disease) Await test results.  Counseled that RN will call if needs to RTC for treatment once results are back. - WET PREP FOR TRICH, YEAST, CLUE - Chlamydia/Gonorrhea [facility]  3. Anxiety Per patient history of same and has plan to seek treatment once she gets insurance straightened out. LCSW and [employer] cards given to patient to use as resources if needed.  4. Depression, unspecified depression type See above.  5. Well woman exam (no  gynecological exam) Reviewed with patient healthy habits to maintain general health. Enc patient to follow up with PCP for eval for UTI and discuss treatment for cyst. Enc MVI 1 po daily. Enc to establish with/ follow up with PCP for primary care concerns, age appropriate screenings and illness.     Return if symptoms worsen or fail to improve, for annual and PRN.  No future appointments.  Matt Holmes, PA

## 2020-01-14 ENCOUNTER — Encounter: Payer: Self-pay | Admitting: Emergency Medicine

## 2020-01-14 ENCOUNTER — Other Ambulatory Visit: Payer: Self-pay

## 2020-01-14 ENCOUNTER — Emergency Department
Admission: EM | Admit: 2020-01-14 | Discharge: 2020-01-14 | Disposition: A | Discharge status: Home / Self Care | Payer: Medicaid Other | Attending: Emergency Medicine | Admitting: Emergency Medicine

## 2020-01-14 DIAGNOSIS — E119 Type 2 diabetes mellitus without complications: Secondary | ICD-10-CM | POA: Insufficient documentation

## 2020-01-14 DIAGNOSIS — L02215 Cutaneous abscess of perineum: Secondary | ICD-10-CM | POA: Insufficient documentation

## 2020-01-14 MED ORDER — LIDOCAINE HCL (PF) 1 % IJ SOLN
INTRAMUSCULAR | Status: AC
  Filled 2020-01-14: qty 5

## 2020-01-14 MED ORDER — SULFAMETHOXAZOLE-TRIMETHOPRIM 800-160 MG PO TABS: 1.0000 | ORAL_TABLET | Freq: Two times a day (BID) | ORAL | 0 refills | Status: AC

## 2020-01-14 MED ORDER — LIDOCAINE HCL (PF) 1 % IJ SOLN
5.0000 mL | Freq: Once | INTRAMUSCULAR | Status: AC
  Administered 2020-01-14: 5 mL via INTRADERMAL
  Filled 2020-01-14: qty 5

## 2020-01-14 MED ORDER — DOXYCYCLINE MONOHYDRATE 100 MG PO TABS: 100.0000 mg | ORAL_TABLET | Freq: Two times a day (BID) | ORAL | 0 refills | Status: AC

## 2020-01-14 NOTE — ED Triage Notes (Signed)
Pt via pov from home with vaginal abscess x 1 month. She reports that it has gotten somewhat better and then worse again. She reports that it is draining some. Pt alert & oriented, nad noted.

## 2020-01-14 NOTE — ED Notes (Signed)
Patient denies pain and is resting comfortably. Patient states that she has been applying warm compress with relieves pain.

## 2020-01-15 NOTE — ED Provider Notes (Signed)
[facility] Emergency Department Provider Note  ____________________________________________   First MD Initiated Contact with Patient 01/14/20 1122     (approximate)  I have reviewed the triage vital signs and the nursing notes.   HISTORY  Chief Complaint Abscess  HPI Jessica Mitchell is a 29 y.o. female who reports to the emergency department for evaluation of perineal abscess.  She states that she has had the lump for approximately 1 month.  She states that it fluctuates in size and severity of pain and on 2 occasions has spontaneously drained.  She states that it initially showed up after shaving and grew after that.  She has not had any treatment up and to this point except for warm compresses at home.  She denies dysuria, abdominal pain, fever or any other systemic symptoms.         Past Medical History:  Diagnosis Date  . Anxiety   . Depression    depression and anxiety  . Fatigue   . Mucous in stools   . Type 2 diabetes mellitus (HCC)    pt states she does not have diabetes  . Unintentional weight loss   . Vaginal Pap smear, abnormal     Patient Active Problem List   Diagnosis Date Noted  . Acute blood loss anemia 04/04/2018  . Delivered by cesarean delivery following previous cesarean delivery 04/04/2018  . Postpartum care following cesarean delivery 04/04/2018  . History of cesarean delivery 04/02/2018  . Hx of preterm delivery, currently pregnant, third trimester 02/02/2018  . Pregnancy with suprapubic pain, antepartum 01/24/2018  . History of cesarean section 08/10/2017  . Depression   . Anxiety   . History of abnormal cervical Pap smear 01/31/2015    Past Surgical History:  Procedure Laterality Date  . CESAREAN SECTION    . CESAREAN SECTION N/A 04/02/2018   Procedure: CESAREAN SECTION;  Surgeon: Conard Novak, MD;  Location: [facility] ORS;  Service: Obstetrics;  Laterality: N/A;  . FINGER SURGERY Right    2/2 trauma     Prior to Admission medications   Medication Sig Start Date End Date Taking? Authorizing Provider  doxycycline (ADOXA) 100 MG tablet Take 1 tablet (100 mg total) by mouth 2 (two) times daily for 7 days. 01/14/20 01/21/20  Lucy Chris, PA  ferrous sulfate 325 (65 FE) MG tablet Take 1 tablet (325 mg total) by mouth 2 (two) times daily with a meal. Patient not taking: Reported on 01/08/2020 04/04/18   Farrel Conners, CNM  ibuprofen (ADVIL,MOTRIN) 600 MG tablet Take 1 tablet (600 mg total) by mouth every 6 (six) hours. Patient not taking: Reported on 01/08/2020 04/04/18   Farrel Conners, CNM  Prenatal Vit-Fe Fumarate-FA (MULTIVITAMIN-PRENATAL) 27-0.8 MG TABS tablet Take 1 tablet by mouth daily at 12 noon. Patient not taking: Reported on 01/08/2020    [provider]  sulfamethoxazole-trimethoprim (BACTRIM DS) 800-160 MG tablet Take 1 tablet by mouth 2 (two) times daily for 7 days. 01/14/20 01/21/20  Lucy Chris, PA    Allergies Patient has no known allergies.  Family History  Problem Relation Age of Onset  . Diabetes Maternal Grandfather   . Hypertension Maternal Grandfather   . Stroke Maternal Grandfather   . Lung cancer Paternal Grandmother   . COPD Maternal Grandmother   . Lung cancer Maternal Grandmother     Social History Social History   Tobacco Use  . Smoking status: Never Smoker  . Smokeless tobacco: Never Used  Vaping Use  .  Vaping Use: Some days  Substance Use Topics  . Alcohol use: Yes    Comment: ocassionaly  . Drug use: Not Currently    Types: Marijuana    Review of Systems Constitutional: No fever/chills Eyes: No visual changes. ENT: No sore throat. Cardiovascular: Denies chest pain. Respiratory: Denies shortness of breath. Gastrointestinal: No abdominal pain.  No nausea, no vomiting.  No diarrhea.  No constipation. Genitourinary: Negative for dysuria. Musculoskeletal: Negative for back pain. Skin: + Lump in perineal region,  negative for rash. Neurological: Negative for headaches, focal weakness or numbness.   ____________________________________________   PHYSICAL EXAM:  VITAL SIGNS: ED Triage Vitals  Enc Vitals Group     BP 01/14/20 1001 102/64     Pulse Rate 01/14/20 1001 60     Resp 01/14/20 1001 18     Temp 01/14/20 1001 98.2 F (36.8 C)     Temp Source 01/14/20 1001 Oral     SpO2 01/14/20 1001 100 %     Weight 01/14/20 1002 110 lb (49.9 kg)     Height 01/14/20 1002 5\' 4"  (1.626 m)     Head Circumference --      Peak Flow --      Pain Score 01/14/20 1232 0     Pain Loc --      Pain Edu? --      Excl. in GC? --    Constitutional: Alert and oriented. Well appearing and in no acute distress. Eyes: Conjunctivae are normal. PERRL. EOMI. Head: Atraumatic. Nose: No congestion/rhinnorhea. Mouth/Throat: Mucous membranes are moist.  Neck: No stridor.   Cardiovascular: Normal rate, regular rhythm.   Good peripheral circulation. Respiratory: Normal respiratory effort.  No retractions. Gastrointestinal: Soft and nontender. No distention. No abdominal bruits. No CVA tenderness. Genitourinary: There is a 1.5 cm x 1.5 cm raised area in the 11 o'clock position of the labia majora.  It is fluctuant in the center with surrounding induration.  There is a punctate lesion in the center that is scabbed with no active drainage.  No erythema present.  Rest of the external genitalia appears normal. Musculoskeletal: No lower extremity tenderness nor edema.  No joint effusions. Neurologic:  Normal speech and language. No gross focal neurologic deficits are appreciated. No gait instability. Skin:  Skin is warm, dry and intact except as described above. No rash noted. Psychiatric: Mood and affect are normal. Speech and behavior are normal.  ____________________________________________   PROCEDURES  Procedure(s) performed (including Critical Care):  11/24/21Marland KitchenIncision and Drainage  Date/Time: 01/15/2020 7:53  PM Performed by: 01/17/2020, PA Authorized by: Lucy Chris, PA   Consent:    Consent obtained:  Verbal   Consent given by:  Patient   Risks discussed:  Incomplete drainage, bleeding and pain   Alternatives discussed:  No treatment, observation, delayed treatment and referral Location:    Type:  Abscess   Location:  Anogenital   Anogenital location:  Perineum Pre-procedure details:    Skin preparation:  Betadine Anesthesia (see MAR for exact dosages):    Anesthesia method:  Local infiltration   Local anesthetic:  Lidocaine 1% w/o epi Procedure type:    Complexity:  Simple Procedure details:    Incision types:  Stab incision   Incision depth:  Subcutaneous   Scalpel blade:  15   Wound management:  Probed and deloculated   Drainage:  Purulent   Drainage amount:  Scant   Wound treatment:  Wound left open   Packing materials:  None  Post-procedure details:    Patient tolerance of procedure:  Tolerated well, no immediate complications     ____________________________________________   INITIAL IMPRESSION / ASSESSMENT AND PLAN / ED COURSE  As part of my medical decision making, I reviewed the following data within the electronic MEDICAL RECORD NUMBER Nursing notes reviewed and incorporated and Notes from prior ED visits        Patient is a 29 year old female presents to the emergency department for evaluation of "lump" to the perineal region.  She states has been there for a month, fluctuating in size and in amount of pain.  She has had 2 episodes of spontaneous drainage, though it is enlarged to her today.  She would like this drained.  On physical exam there is a 1.5 x 1.5 cm round raised area with fluctuance and surrounding induration.  There is no erythema or current drainage.  Risks and benefits of drainage were discussed with the patient and she would like to proceed with attempted drainage.  The area was anesthetized with 1% lidocaine and a stab incision was made  in the most fluctuant part.  A scant amount of purulent material was expressed, however despite probing with blunt hemostats, further material could not be removed.  Was recommended that the patient follow-up with OB/GYN for possible repeat I&D versus surgical excision versus observation.  The patient will be placed on double coverage antibiotics given location of the abscess and the length of time that she has had it.  Patient was advised on signs and symptoms of further infection and agrees to return to the emergency department if she experiences any of these.  Otherwise, patient is stable for discharge at this time.      ____________________________________________   FINAL CLINICAL IMPRESSION(S) / ED DIAGNOSES  Final diagnoses:  Perineal abscess, superficial     ED Discharge Orders         Ordered    sulfamethoxazole-trimethoprim (BACTRIM DS) 800-160 MG tablet  2 times daily        01/14/20 1222    doxycycline (ADOXA) 100 MG tablet  2 times daily        01/14/20 1222          *Please note:  Jessica Mitchell was evaluated in Emergency Department on 01/15/2020 for the symptoms described in the history of present illness. She was evaluated in the context of the global COVID-19 pandemic, which necessitated consideration that the patient might be at risk for infection with the SARS-CoV-2 virus that causes COVID-19. Institutional protocols and algorithms that pertain to the evaluation of patients at risk for COVID-19 are in a state of rapid change based on information released by regulatory bodies including the CDC and federal and state organizations. These policies and algorithms were followed during the patient's care in the ED.  Some ED evaluations and interventions may be delayed as a result of limited staffing during and the pandemic.*   Note:  This document was prepared using Dragon voice recognition software and may include unintentional dictation errors.    Lucy Chris,  PA 01/15/20 1958    Arnaldo Natal, MD 01/15/20 2020

## 2020-02-13 ENCOUNTER — Ambulatory Visit: Payer: Medicaid Other | Admitting: Obstetrics and Gynecology

## 2020-02-19 ENCOUNTER — Ambulatory Visit: Payer: BLUE CROSS/BLUE SHIELD | Admitting: Obstetrics and Gynecology

## 2020-03-03 ENCOUNTER — Ambulatory Visit: Payer: BLUE CROSS/BLUE SHIELD | Admitting: Obstetrics and Gynecology

## 2020-04-01 ENCOUNTER — Ambulatory Visit (LOCAL_COMMUNITY_HEALTH_CENTER): Payer: Medicaid Other

## 2020-04-01 ENCOUNTER — Other Ambulatory Visit: Payer: Self-pay

## 2020-04-01 VITALS — BP 120/75 | Ht 64.0 in | Wt 103.5 lb

## 2020-04-01 DIAGNOSIS — Z3201 Encounter for pregnancy test, result positive: Secondary | ICD-10-CM

## 2020-04-01 LAB — PREGNANCY, URINE: Preg Test, Ur: POSITIVE — AB

## 2020-04-01 MED ORDER — PRENATAL VITAMINS 28-0.8 MG PO TABS: 28.0000 mg | ORAL_TABLET | Freq: Every day | ORAL | 0 refills | Status: DC

## 2020-04-01 NOTE — Progress Notes (Signed)
Patient plans PNV at [facility]. PNV given. Richmond Campbell, RN

## 2020-04-02 ENCOUNTER — Telehealth: Payer: Self-pay | Admitting: General Practice

## 2020-04-02 NOTE — Telephone Encounter (Signed)
Pt had the pfizer vaccine but has not had the booster.  Pt just found out she is pregnant, so she is going to make sure it is ok to get the booster.

## 2020-04-11 ENCOUNTER — Emergency Department
Admission: EM | Admit: 2020-04-11 | Discharge: 2020-04-11 | Disposition: A | Discharge status: Home / Self Care | Payer: Medicaid Other | Attending: Emergency Medicine | Admitting: Emergency Medicine

## 2020-04-11 ENCOUNTER — Emergency Department: Payer: Medicaid Other

## 2020-04-11 ENCOUNTER — Other Ambulatory Visit: Payer: Self-pay

## 2020-04-11 ENCOUNTER — Ambulatory Visit (LOCAL_COMMUNITY_HEALTH_CENTER): Payer: Medicaid Other

## 2020-04-11 DIAGNOSIS — O4691 Antepartum hemorrhage, unspecified, first trimester: Secondary | ICD-10-CM | POA: Insufficient documentation

## 2020-04-11 DIAGNOSIS — O4690 Antepartum hemorrhage, unspecified, unspecified trimester: Secondary | ICD-10-CM

## 2020-04-11 DIAGNOSIS — Z2913 Encounter for prophylactic Rho(D) immune globulin: Secondary | ICD-10-CM | POA: Diagnosis not present

## 2020-04-11 DIAGNOSIS — N939 Abnormal uterine and vaginal bleeding, unspecified: Secondary | ICD-10-CM

## 2020-04-11 DIAGNOSIS — Z3A01 Less than 8 weeks gestation of pregnancy: Secondary | ICD-10-CM | POA: Diagnosis not present

## 2020-04-11 DIAGNOSIS — Z111 Encounter for screening for respiratory tuberculosis: Secondary | ICD-10-CM

## 2020-04-11 LAB — CBC WITH DIFFERENTIAL/PLATELET
Abs Immature Granulocytes: 0.05 10*3/uL (ref 0.00–0.07)
Basophils Absolute: 0.1 10*3/uL (ref 0.0–0.1)
Basophils Relative: 0 %
Eosinophils Absolute: 0 10*3/uL (ref 0.0–0.5)
Eosinophils Relative: 0 %
HCT: 36.5 % (ref 36.0–46.0)
Hemoglobin: 12.7 g/dL (ref 12.0–15.0)
Immature Granulocytes: 0 %
Lymphocytes Relative: 14 %
Lymphs Abs: 2.1 10*3/uL (ref 0.7–4.0)
MCH: 31.2 pg (ref 26.0–34.0)
MCHC: 34.8 g/dL (ref 30.0–36.0)
MCV: 89.7 fL (ref 80.0–100.0)
Monocytes Absolute: 0.7 10*3/uL (ref 0.1–1.0)
Monocytes Relative: 4 %
Neutro Abs: 12.9 10*3/uL — ABNORMAL HIGH (ref 1.7–7.7)
Neutrophils Relative %: 82 %
Platelets: 205 10*3/uL (ref 150–400)
RBC: 4.07 MIL/uL (ref 3.87–5.11)
RDW: 12.6 % (ref 11.5–15.5)
WBC: 15.9 10*3/uL — ABNORMAL HIGH (ref 4.0–10.5)
nRBC: 0 % (ref 0.0–0.2)

## 2020-04-11 LAB — WET PREP, GENITAL
Clue Cells Wet Prep HPF POC: NONE SEEN
Sperm: NONE SEEN
Trich, Wet Prep: NONE SEEN
Yeast Wet Prep HPF POC: NONE SEEN

## 2020-04-11 LAB — URINALYSIS, ROUTINE W REFLEX MICROSCOPIC
Bacteria, UA: NONE SEEN
Bilirubin Urine: NEGATIVE
Glucose, UA: NEGATIVE mg/dL
Ketones, ur: 5 mg/dL — AB
Nitrite: NEGATIVE
Protein, ur: NEGATIVE mg/dL
Specific Gravity, Urine: 1.028 (ref 1.005–1.030)
pH: 5 (ref 5.0–8.0)

## 2020-04-11 LAB — CHLAMYDIA/NGC RT PCR (ARMC ONLY)
Chlamydia Tr: NOT DETECTED
N gonorrhoeae: NOT DETECTED

## 2020-04-11 LAB — COMPREHENSIVE METABOLIC PANEL
ALT: 12 U/L (ref 0–44)
AST: 17 U/L (ref 15–41)
Albumin: 4.3 g/dL (ref 3.5–5.0)
Alkaline Phosphatase: 48 U/L (ref 38–126)
Anion gap: 8 (ref 5–15)
BUN: 6 mg/dL (ref 6–20)
CO2: 19 mmol/L — ABNORMAL LOW (ref 22–32)
Calcium: 9.2 mg/dL (ref 8.9–10.3)
Chloride: 107 mmol/L (ref 98–111)
Creatinine, Ser: 0.54 mg/dL (ref 0.44–1.00)
GFR, Estimated: >60 mL/min (ref >60)
Glucose, Bld: 98 mg/dL (ref 70–99)
Potassium: 3.5 mmol/L (ref 3.5–5.1)
Sodium: 134 mmol/L — ABNORMAL LOW (ref 135–145)
Total Bilirubin: 0.8 mg/dL (ref 0.3–1.2)
Total Protein: 7.8 g/dL (ref 6.5–8.1)

## 2020-04-11 LAB — ABO/RH: ABO/RH(D): O NEG

## 2020-04-11 LAB — HCG, QUANTITATIVE, PREGNANCY: hCG, Beta Chain, Quant, S: 41803 m[IU]/mL — ABNORMAL HIGH (ref <5)

## 2020-04-11 LAB — ANTIBODY SCREEN: Antibody Screen: NEGATIVE

## 2020-04-11 LAB — POC URINE PREG, ED: Preg Test, Ur: POSITIVE — AB

## 2020-04-11 MED ORDER — RHO D IMMUNE GLOBULIN 1500 UNIT/2ML IJ SOSY
300.0000 ug | PREFILLED_SYRINGE | Freq: Once | INTRAMUSCULAR | Status: AC
  Administered 2020-04-11: 300 ug via INTRAMUSCULAR
  Filled 2020-04-11: qty 2

## 2020-04-11 MED ORDER — ACETAMINOPHEN 500 MG PO TABS
1000.0000 mg | ORAL_TABLET | Freq: Once | ORAL | Status: AC
  Administered 2020-04-11: 1000 mg via ORAL
  Filled 2020-04-11: qty 2

## 2020-04-11 NOTE — ED Notes (Signed)
Blood bank called for update on medication preparation. States will be ready in 20 minutes.

## 2020-04-11 NOTE — ED Notes (Signed)
Pharmacy notified this RN that medication comes from lab. Lab contacted and states they do not have active order for medication administration. Lab states they will call back with clarification shortly.

## 2020-04-11 NOTE — Discharge Instructions (Addendum)
You may take Tylenol 1000 mg every 6 hours as needed for pain.  I recommend increasing your fluid intake.  Your labs, urine and ultrasound were normal today.  You have a fetus that is measuring approximately 6 weeks and 3 days with an estimated due date of 12/02/2020.  I recommend close follow-up with your OB/GYN.  If you do not have an OB/GYN, information has been provided for one.

## 2020-04-11 NOTE — ED Triage Notes (Signed)
Pt states she is [redacted] weeks pregnant and having abd pain that is cramping. Pt states when she uses the bathroom she notices blood within her discharge and small amounts of bright red blood when she wipes.

## 2020-04-11 NOTE — ED Notes (Signed)
Patient signed consent for blood product administration. Consent placed in medical records bin.

## 2020-04-11 NOTE — ED Provider Notes (Signed)
[facility] Emergency Department Provider Note  ____________________________________________   Event Date/Time   First MD Initiated Contact with Patient 04/11/20 [phone]     (approximate)  I have reviewed the triage vital signs and the nursing notes.   HISTORY  Chief Complaint Vaginal Bleeding    HPI Jessica Mitchell is a 30 y.o. female Jessica Mitchell (has had a twin pregnancy) who presents to the emergency department with lower abdominal cramping and vaginal bleeding.  States she noticed just a small amount of blood with wiping today.  No clots or tissue.  She has had morning sickness and intermittent diarrhea.  Reports some burning with urination.  No abnormal vaginal discharge. She thinks her LMP was 02/11/20. Has follow-up with her OB/GYN on Tuesday the 22nd.        Past Medical History:  Diagnosis Date  . Anxiety   . Depression    depression and anxiety  . Fatigue   . Mucous in stools   . Unintentional weight loss   . Vaginal Pap smear, abnormal     Patient Active Problem List   Diagnosis Date Noted  . Acute blood loss anemia 04/04/2018  . Delivered by cesarean delivery following previous cesarean delivery 04/04/2018  . Postpartum care following cesarean delivery 04/04/2018  . History of cesarean delivery 04/02/2018  . Hx of preterm delivery, currently pregnant, third trimester 02/02/2018  . Pregnancy with suprapubic pain, antepartum 01/24/2018  . History of cesarean section 08/10/2017  . Depression   . Anxiety   . History of abnormal cervical Pap smear 01/31/2015    Past Surgical History:  Procedure Laterality Date  . CESAREAN SECTION    . CESAREAN SECTION N/A 04/02/2018   Procedure: CESAREAN SECTION;  Surgeon: Conard Novak, MD;  Location: [facility] ORS;  Service: Obstetrics;  Laterality: N/A;  . FINGER SURGERY Right    2/2 trauma    Prior to Admission medications   Medication Sig Start Date End Date Taking? Authorizing Provider   Prenatal Vit-Fe Fumarate-FA (PRENATAL VITAMINS) 28-0.8 MG TABS Take 28 mg by mouth daily. 04/01/20 06/30/20  Federico Flake, MD    Allergies Patient has no known allergies.  Family History  Problem Relation Age of Onset  . Diabetes Maternal Grandfather   . Hypertension Maternal Grandfather   . Stroke Maternal Grandfather   . Lung cancer Paternal Grandmother   . COPD Maternal Grandmother   . Lung cancer Maternal Grandmother     Social History Social History   Tobacco Use  . Smoking status: Never Smoker  . Smokeless tobacco: Never Used  Vaping Use  . Vaping Use: Never used  Substance Use Topics  . Alcohol use: Not Currently    Comment: ocassionaly  . Drug use: Not Currently    Types: Marijuana    Review of Systems Constitutional: No fever. Eyes: No visual changes. ENT: No sore throat. Cardiovascular: Denies chest pain. Respiratory: Denies shortness of breath. Gastrointestinal: + nausea, vomiting, diarrhea. Genitourinary: + for dysuria. Musculoskeletal: Negative for back pain. Skin: Negative for rash. Neurological: Negative for focal weakness or numbness.  ____________________________________________   PHYSICAL EXAM:  VITAL SIGNS: ED Triage Vitals  Enc Vitals Group     BP 04/11/20 0016 104/78     Pulse Rate 04/11/20 0016 80     Resp 04/11/20 0016 15     Temp 04/11/20 0016 99.2 F (37.3 C)     Temp src --      SpO2 04/11/20 0016 97 %  Weight 04/11/20 0015 103 lb (46.7 kg)     Height 04/11/20 0015 5\' 3"  (1.6 m)     Head Circumference --      Peak Flow --      Pain Score 04/11/20 0015 7     Pain Loc --      Pain Edu? --      Excl. in GC? --    CONSTITUTIONAL: Alert and oriented and responds appropriately to questions. Well-appearing; well-nourished HEAD: Normocephalic EYES: Conjunctivae clear, pupils appear equal, EOM appear intact ENT: normal nose; moist mucous membranes NECK: Supple, normal ROM CARD: RRR; S1 and S2 appreciated; no murmurs,  no clicks, no rubs, no gallops RESP: Normal chest excursion without splinting or tachypnea; breath sounds clear and equal bilaterally; no wheezes, no rhonchi, no rales, no hypoxia or respiratory distress, speaking full sentences ABD/GI: Normal bowel sounds; non-distended; soft, non-tender, no rebound, no guarding, no peritoneal signs, no hepatosplenomegaly GU:  Normal external genitalia. No lesions, rashes noted.  Very minimal dark red vaginal bleeding coming from the cervical os with a small amount of mucus appearing vaginal discharge.  No adnexal tenderness, mass or fullness, no cervical motion tenderness. Cervix is not appear friable.  Cervix is closed.  Chaperone present for exam. BACK: The back appears normal EXT: Normal ROM in all joints; no deformity noted, no edema; no cyanosis SKIN: Normal color for age and race; warm; no rash on exposed skin NEURO: Moves all extremities equally PSYCH: The patient's mood and manner are appropriate.  ____________________________________________   LABS (all labs ordered are listed, but only abnormal results are displayed)  Labs Reviewed  WET PREP, GENITAL - Abnormal; Notable for the following components:      Result Value   WBC, Wet Prep HPF POC FEW (*)    All other components within normal limits  HCG, QUANTITATIVE, PREGNANCY - Abnormal; Notable for the following components:   hCG, Beta Chain, Quant, S 41,803 (*)    All other components within normal limits  URINALYSIS, ROUTINE W REFLEX MICROSCOPIC - Abnormal; Notable for the following components:   Color, Urine YELLOW (*)    APPearance CLOUDY (*)    Hgb urine dipstick MODERATE (*)    Ketones, ur 5 (*)    Leukocytes,Ua MODERATE (*)    All other components within normal limits  COMPREHENSIVE METABOLIC PANEL - Abnormal; Notable for the following components:   Sodium 134 (*)    CO2 19 (*)    All other components within normal limits  CBC WITH DIFFERENTIAL/PLATELET - Abnormal; Notable for the  following components:   WBC 15.9 (*)    Neutro Abs 12.9 (*)    All other components within normal limits  POC URINE PREG, ED - Abnormal; Notable for the following components:   Preg Test, Ur POSITIVE (*)    All other components within normal limits  CHLAMYDIA/NGC RT PCR ([facility] ONLY)  ABO/RH  ANTIBODY SCREEN   ____________________________________________  EKG  None ____________________________________________  RADIOLOGY I, Nikos Anglemyer, personally viewed and evaluated these images (plain radiographs) as part of my medical decision making, as well as reviewing the written report by the radiologist.  ED MD interpretation: Normal IUP.  Official radiology report(s): US OB LESS THAN 14 WEEKS WITH OB TRANSVAGINAL  Result Date: 04/11/2020 CLINICAL DATA:  Pregnant patient in first-trimester pregnancy with vaginal bleeding. Gestational age by LMP 10 weeks 4 days. EXAM: OBSTETRIC <14 WK US AND TRANSVAGINAL OB US TECHNIQUE: Both transabdominal and transvaginal ultrasound examinations were performed  for complete evaluation of the gestation as well as the maternal uterus, adnexal regions, and pelvic cul-de-sac. Transvaginal technique was performed to assess early pregnancy. COMPARISON:  None this pregnancy. FINDINGS: Intrauterine gestational sac: Single Yolk sac:  Visualized. Embryo:  Visualized. Cardiac Activity: Visualized. Heart Rate: 120 bpm CRL:  5.9 mm   6 w   3 d                  [place] EDC: 12/02/2020 Subchorionic hemorrhage:  None visualized. Maternal uterus/adnexae: Right ovary is visualized and normal. The left ovary is visualized and normal. Ovarian blood flow is seen. No adnexal mass or pelvic free fluid. IMPRESSION: Single live intrauterine pregnancy estimated gestational age [redacted] weeks 3 days based on crown-rump length for ultrasound [facility] 12/02/2020. Gestational age is discordant with dates based on LMP, and continued clinical follow-up is recommended. No subchorionic hemorrhage.  Electronically Signed   By: Narda Rutherford M.D.   On: 04/11/2020 02:30    ____________________________________________   PROCEDURES  Procedure(s) performed (including Critical Care):  Procedures   ____________________________________________   INITIAL IMPRESSION / ASSESSMENT AND PLAN / ED COURSE  As part of my medical decision making, I reviewed the following data within the electronic MEDICAL RECORD NUMBER Nursing notes reviewed and incorporated, Labs reviewed, [place] reviewed, Old chart reviewed and Notes from prior ED visits         Patient here with vaginal bleeding and pregnancy.  Abdominal exam is benign.  Labs show leukocytosis with left shift.  She is afebrile.  Hemoglobin normal.  Quant is greater than 41,000.  Urine does not appear infected.  She does have moderate leukocytes but many squamous cells and no bacteria.  She is O-.  Will give RhoGam.  Pelvic exam is unremarkable today and her wet prep is unrevealing.  GC and chlamydia pending.  She will be contacted if anything is positive on her GC and chlamydia screen.  Ultrasound shows a single live intrauterine pregnancy measuring 6 weeks and 3 days with fetal heart rate of 120.  No subchorionic hemorrhage.  She has very minimal bleeding on exam today.  No adnexal tenderness, cervical motion tenderness.  Doubt TOA, PID, torsion, appendicitis, UTI, pyelonephritis.  She has follow-up with her OB/GYN in 4 days.  Recommended Tylenol as needed for pain.  Discussed bleeding return precautions.  Patient verbalized understanding.  At this time, I do not feel there is any life-threatening condition present. I have reviewed, interpreted and discussed all results (EKG, imaging, lab, urine as appropriate) and exam findings with patient/family. I have reviewed nursing notes and appropriate previous records.  I feel the patient is safe to be discharged home without further emergent workup and can continue workup as an outpatient as needed. Discussed  usual and customary return precautions. Patient/family verbalize understanding and are comfortable with this plan.  Outpatient follow-up has been provided as needed. All questions have been answered.  ____________________________________________   FINAL CLINICAL IMPRESSION(S) / ED DIAGNOSES  Final diagnoses:  Vaginal bleeding in pregnancy     ED Discharge Orders    None      *Please note:  JESSENIA FILIPPONE was evaluated in Emergency Department on 04/11/2020 for the symptoms described in the history of present illness. She was evaluated in the context of the global COVID-19 pandemic, which necessitated consideration that the patient might be at risk for infection with the SARS-CoV-2 virus that causes COVID-19. Institutional protocols and algorithms that pertain to the evaluation of patients at risk  for COVID-19 are in a state of rapid change based on information released by regulatory bodies including the CDC and federal and state organizations. These policies and algorithms were followed during the patient's care in the ED.  Some ED evaluations and interventions may be delayed as a result of limited staffing during and the pandemic.*   Note:  This document was prepared using Dragon voice recognition software and may include unintentional dictation errors.   Tylan Kinn, Layla Maw, DO 04/11/20 [phone]

## 2020-04-11 NOTE — ED Notes (Signed)
Lab states they are in the process of preparing medication for administration. Pt aware.

## 2020-04-11 NOTE — ED Notes (Signed)
Waiting on medication from pharmacy. Pt aware.

## 2020-04-11 NOTE — ED Notes (Signed)
Pelvic cart set up and ready at bedside 

## 2020-04-12 ENCOUNTER — Other Ambulatory Visit: Payer: Self-pay

## 2020-04-12 DIAGNOSIS — Z79899 Other long term (current) drug therapy: Secondary | ICD-10-CM | POA: Insufficient documentation

## 2020-04-12 DIAGNOSIS — Z3A01 Less than 8 weeks gestation of pregnancy: Secondary | ICD-10-CM | POA: Diagnosis not present

## 2020-04-12 DIAGNOSIS — N939 Abnormal uterine and vaginal bleeding, unspecified: Secondary | ICD-10-CM | POA: Diagnosis present

## 2020-04-12 DIAGNOSIS — O200 Threatened abortion: Secondary | ICD-10-CM | POA: Insufficient documentation

## 2020-04-12 LAB — RHOGAM INJECTION: Unit division: 0

## 2020-04-12 NOTE — ED Triage Notes (Signed)
Pt states is [redacted] weeks pregnant and passed a vaginal blood clot one hour pta. Pt denies pain, dysuria, hematuria.

## 2020-04-13 ENCOUNTER — Emergency Department
Admission: EM | Admit: 2020-04-13 | Discharge: 2020-04-13 | Disposition: A | Discharge status: Home / Self Care | Payer: Medicaid Other | Attending: Emergency Medicine | Admitting: Emergency Medicine

## 2020-04-13 DIAGNOSIS — O200 Threatened abortion: Secondary | ICD-10-CM

## 2020-04-13 LAB — CBC WITH DIFFERENTIAL/PLATELET
Abs Immature Granulocytes: 0.1 K/uL — ABNORMAL HIGH (ref 0.00–0.07)
Basophils Absolute: 0.1 K/uL (ref 0.0–0.1)
Basophils Relative: 0 %
Eosinophils Absolute: 0.1 K/uL (ref 0.0–0.5)
Eosinophils Relative: 0 %
HCT: 37.2 % (ref 36.0–46.0)
Hemoglobin: 12.8 g/dL (ref 12.0–15.0)
Immature Granulocytes: 1 %
Lymphocytes Relative: 11 %
Lymphs Abs: 2 K/uL (ref 0.7–4.0)
MCH: 31.2 pg (ref 26.0–34.0)
MCHC: 34.4 g/dL (ref 30.0–36.0)
MCV: 90.7 fL (ref 80.0–100.0)
Monocytes Absolute: 0.7 K/uL (ref 0.1–1.0)
Monocytes Relative: 4 %
Neutro Abs: 15.1 K/uL — ABNORMAL HIGH (ref 1.7–7.7)
Neutrophils Relative %: 84 %
Platelets: 192 K/uL (ref 150–400)
RBC: 4.1 MIL/uL (ref 3.87–5.11)
RDW: 12.6 % (ref 11.5–15.5)
WBC: 18.1 K/uL — ABNORMAL HIGH (ref 4.0–10.5)
nRBC: 0 % (ref 0.0–0.2)

## 2020-04-13 LAB — HCG, QUANTITATIVE, PREGNANCY: hCG, Beta Chain, Quant, S: 51279 m[IU]/mL — ABNORMAL HIGH (ref <5)

## 2020-04-13 NOTE — Discharge Instructions (Signed)
Continue to take your prenatal vitamins.  Follow-up with your OB/GYN for further evaluation.  As we discussed this could be a normal bleeding for the first trimester or threatened miscarriage.  The only way to find out is to have an ultrasound done within 7 to 10 days to evaluate the pregnancy.  In the meantime if you do have a miscarriage you should expect a heavier than normal period. Return to the ER if your bleeding is severe to the point you feel like you are going to pass out, or if you have chest pain and/or shortness of breath.

## 2020-04-13 NOTE — ED Provider Notes (Signed)
[facility] Emergency Department Provider Note  ____________________________________________  Time seen: Approximately 12:53 AM  I have reviewed the triage vital signs and the nursing notes.   HISTORY  Chief Complaint Vaginal Bleeding   HPI Jessica Mitchell is a 30 y.o. female currently [redacted]w[redacted]d pregnant who presents for evaluation of continue vaginal bleeding.  Patient was seen here 2 nights ago for the same.  Had an ultrasound showing a normal IUP with with gestational age of [redacted] weeks and 3 days.  She reports that she has had continued spotting and then today passed a large blood clot.  She reports that she feels very anxious and need to know she is having a miscarriage.  She denies any abdominal pain, heavy bleeding, syncope, dizziness, chest pain, shortness of breath.  Patient received a shot of RhoGam on her visit 2 days ago.  Past Medical History:  Diagnosis Date  . Anxiety   . Depression    depression and anxiety  . Fatigue   . Mucous in stools   . Unintentional weight loss   . Vaginal Pap smear, abnormal     Patient Active Problem List   Diagnosis Date Noted  . Acute blood loss anemia 04/04/2018  . Delivered by cesarean delivery following previous cesarean delivery 04/04/2018  . Postpartum care following cesarean delivery 04/04/2018  . History of cesarean delivery 04/02/2018  . Hx of preterm delivery, currently pregnant, third trimester 02/02/2018  . Pregnancy with suprapubic pain, antepartum 01/24/2018  . History of cesarean section 08/10/2017  . Depression   . Anxiety   . History of abnormal cervical Pap smear 01/31/2015    Past Surgical History:  Procedure Laterality Date  . CESAREAN SECTION    . CESAREAN SECTION N/A 04/02/2018   Procedure: CESAREAN SECTION;  Surgeon: Conard Novak, MD;  Location: [facility] ORS;  Service: Obstetrics;  Laterality: N/A;  . FINGER SURGERY Right    2/2 trauma    Prior to Admission medications    Medication Sig Start Date End Date Taking? Authorizing Provider  Prenatal Vit-Fe Fumarate-FA (PRENATAL VITAMINS) 28-0.8 MG TABS Take 28 mg by mouth daily. 04/01/20 06/30/20  Federico Flake, MD    Allergies Patient has no known allergies.  Family History  Problem Relation Age of Onset  . Diabetes Maternal Grandfather   . Hypertension Maternal Grandfather   . Stroke Maternal Grandfather   . Lung cancer Paternal Grandmother   . COPD Maternal Grandmother   . Lung cancer Maternal Grandmother     Social History Social History   Tobacco Use  . Smoking status: Never Smoker  . Smokeless tobacco: Never Used  Vaping Use  . Vaping Use: Never used  Substance Use Topics  . Alcohol use: Not Currently    Comment: ocassionaly  . Drug use: Not Currently    Types: Marijuana    Review of Systems  Constitutional: Negative for fever. Eyes: Negative for visual changes. ENT: Negative for sore throat. Neck: No neck pain  Cardiovascular: Negative for chest pain. Respiratory: Negative for shortness of breath. Gastrointestinal: Negative for abdominal pain, vomiting or diarrhea. Genitourinary: Negative for dysuria. + vaginal bleeding Musculoskeletal: Negative for back pain. Skin: Negative for rash. Neurological: Negative for headaches, weakness or numbness. Psych: No SI or HI  ____________________________________________   PHYSICAL EXAM:  VITAL SIGNS: ED Triage Vitals  Enc Vitals Group     BP 04/12/20 2341 126/86     Pulse Rate 04/12/20 2341 97     Resp  04/12/20 2341 16     Temp 04/12/20 2341 98.4 F (36.9 C)     Temp Source 04/12/20 2341 Oral     SpO2 04/12/20 2341 100 %     Weight 04/12/20 2336 105 lb (47.6 kg)     Height 04/12/20 2336 5\' 3"  (1.6 m)     Head Circumference --      Peak Flow --      Pain Score 04/12/20 2336 0     Pain Loc --      Pain Edu? --      Excl. in GC? --     Constitutional: Alert and oriented. Well appearing and in no apparent  distress. HEENT:      Head: Normocephalic and atraumatic.         Eyes: Conjunctivae are normal. Sclera is non-icteric.       Mouth/Throat: Mucous membranes are moist.       Neck: Supple with no signs of meningismus. Cardiovascular: Regular rate and rhythm.  Respiratory: Normal respiratory effort.  Gastrointestinal: Soft, non tender. Musculoskeletal:  No edema, cyanosis, or erythema of extremities. Neurologic: Normal speech and language. Face is symmetric. Moving all extremities. No gross focal neurologic deficits are appreciated. Skin: Skin is warm, dry and intact. No rash noted. Psychiatric: Mood and affect are normal. Speech and behavior are normal.  ____________________________________________   LABS (all labs ordered are listed, but only abnormal results are displayed)  Labs Reviewed  HCG, QUANTITATIVE, PREGNANCY - Abnormal; Notable for the following components:      Result Value   hCG, Beta Chain, Quant, S 51,279 (*)    All other components within normal limits  CBC WITH DIFFERENTIAL/PLATELET - Abnormal; Notable for the following components:   WBC 18.1 (*)    Neutro Abs 15.1 (*)    Abs Immature Granulocytes 0.10 (*)    All other components within normal limits   ____________________________________________  EKG  none  ____________________________________________  RADIOLOGY  none  ____________________________________________   PROCEDURES  Procedure(s) performed: None Procedures Critical Care performed:  None ____________________________________________   INITIAL IMPRESSION / ASSESSMENT AND PLAN / ED COURSE  30 y.o. female currently [redacted]w[redacted]d pregnant who presents for evaluation of continue vaginal bleeding.  Patient was here 2 nights ago for the same.  I reviewed her chart from that visit.  She had a pelvic ultrasound which showed a normal pregnancy with gestational age of [redacted] weeks and 3 days with no subchorionic hemorrhage.  She also had normal blood work.  She  was found to be Rh- and given RhoGam.  She had STD testing which was all negative.  She reports ongoing bleeding and passed clot today which made her concerned.  I did do a bedside ultrasound which showed an IUP with a fetal heart rate of 130.  Her hemoglobin remained stable.  Her hCG is trending up.  We discussed that this could be bleeding of the first trimester or threatened miscarriage.  She has an appointment with her OB/GYN this week.  I recommended that she keeps the appointment so they can repeat an ultrasound to determine if patient is having miscarriage or not.  In the meantime we discussed pelvic rest.  I also explained to her that if this is a miscarriage she should expect a heavier than normal period at home.  I discussed with her my standard return precautions for any signs of acute blood loss anemia.       _____________________________________________ Please note:  Patient was evaluated  in Emergency Department today for the symptoms described in the history of present illness. Patient was evaluated in the context of the global COVID-19 pandemic, which necessitated consideration that the patient might be at risk for infection with the SARS-CoV-2 virus that causes COVID-19. Institutional protocols and algorithms that pertain to the evaluation of patients at risk for COVID-19 are in a state of rapid change based on information released by regulatory bodies including the CDC and federal and state organizations. These policies and algorithms were followed during the patient's care in the ED.  Some ED evaluations and interventions may be delayed as a result of limited staffing during the pandemic.    Controlled Substance Database was reviewed by me. ____________________________________________   FINAL CLINICAL IMPRESSION(S) / ED DIAGNOSES   Final diagnoses:  Threatened miscarriage      NEW MEDICATIONS STARTED DURING THIS VISIT:  ED Discharge Orders    None       Note:  This  document was prepared using Dragon voice recognition software and may include unintentional dictation errors.    Nita Sickle, MD 04/13/20 [phone]

## 2020-04-14 ENCOUNTER — Ambulatory Visit (LOCAL_COMMUNITY_HEALTH_CENTER): Payer: Medicaid Other

## 2020-04-14 ENCOUNTER — Telehealth: Payer: Self-pay

## 2020-04-14 ENCOUNTER — Other Ambulatory Visit: Payer: Self-pay

## 2020-04-14 DIAGNOSIS — Z111 Encounter for screening for respiratory tuberculosis: Secondary | ICD-10-CM

## 2020-04-14 LAB — TB SKIN TEST
Induration: 0 mm
TB Skin Test: NEGATIVE

## 2020-04-14 NOTE — Telephone Encounter (Signed)
Pt calling; went to ED Thurs for spotting/bleeding/cramping; they gave her some tylenol and she was fine after that; Saturday she passed a blood clot; u/s Thurs and pelvic exam were normal; still have brown blood.  [phone]  Adv brown means it's old - it's been in there for awhile; the clot means it sat in there ane gelled. Pt wasn't sure if she needed to come in sooner than tomorrow or not.  Adv she was fine to wait until tomorrow as not bleeding heavily and cramping not doubling her over.

## 2020-04-15 ENCOUNTER — Encounter: Payer: Self-pay | Admitting: Obstetrics

## 2020-04-15 ENCOUNTER — Ambulatory Visit (INDEPENDENT_AMBULATORY_CARE_PROVIDER_SITE_OTHER): Payer: BLUE CROSS/BLUE SHIELD | Admitting: Obstetrics

## 2020-04-15 ENCOUNTER — Other Ambulatory Visit (HOSPITAL_COMMUNITY)
Admission: RE | Admit: 2020-04-15 | Discharge: 2020-04-15 | Disposition: A | Discharge status: Home / Self Care | Payer: Medicaid Other | Source: Ambulatory Visit | Attending: Obstetrics and Gynecology | Admitting: Obstetrics and Gynecology

## 2020-04-15 VITALS — BP 114/70 | Wt 105.0 lb

## 2020-04-15 DIAGNOSIS — Z3A11 11 weeks gestation of pregnancy: Secondary | ICD-10-CM | POA: Diagnosis present

## 2020-04-15 DIAGNOSIS — O0990 Supervision of high risk pregnancy, unspecified, unspecified trimester: Secondary | ICD-10-CM | POA: Insufficient documentation

## 2020-04-15 DIAGNOSIS — Z124 Encounter for screening for malignant neoplasm of cervix: Secondary | ICD-10-CM | POA: Insufficient documentation

## 2020-04-15 DIAGNOSIS — Z3480 Encounter for supervision of other normal pregnancy, unspecified trimester: Secondary | ICD-10-CM

## 2020-04-15 DIAGNOSIS — Z113 Encounter for screening for infections with a predominantly sexual mode of transmission: Secondary | ICD-10-CM | POA: Diagnosis present

## 2020-04-15 DIAGNOSIS — Z3490 Encounter for supervision of normal pregnancy, unspecified, unspecified trimester: Secondary | ICD-10-CM | POA: Diagnosis present

## 2020-04-15 DIAGNOSIS — F1290 Cannabis use, unspecified, uncomplicated: Secondary | ICD-10-CM

## 2020-04-15 DIAGNOSIS — F32A Depression, unspecified: Secondary | ICD-10-CM

## 2020-04-15 MED ORDER — ESCITALOPRAM OXALATE 10 MG PO TABS: 10.0000 mg | ORAL_TABLET | Freq: Every day | ORAL | 2 refills | Status: DC

## 2020-04-15 NOTE — Progress Notes (Signed)
New Obstetric Patient H&P    Chief Complaint: "Desires prenatal care"   History of Present Illness: Patient is a 30 y.o. [id number] Not Hispanic or Latino female, LMP uncertain, (perhaps 01/26/2020) presents with amenorrhea and positive home pregnancy test. Based on her  LMP, her EDD is Estimated Date of Delivery: 11/03/20 and her EGA is [redacted]w[redacted]d. Cycles are 5. days, regular, and occur approximately every : 28 days. Her last pap smear was a few years ago and was no abnormalities.    She had a urine pregnancy test which was positive about 2 week(s)  ago. Her last menstrual period was normal and lasted for  about 4 day(s). Since her LMP she claims she has experienced breast tenderness, fatigue, and some depressive symptoms (feeling hopeless and sad, that she is not at her best self). She denies vaginal bleeding. Her past medical history is noncontributory. Her prior pregnancies are notable for multiple gestation, and two CS deliveries.  She had twins with her first pregnancy.  Since her LMP, she admits to the use of tobacco products  no She claims she has gained   no pounds since the start of her pregnancy. She has had trouble putting on weight in previous pregnancies. There are cats in the home in the home  no She admits close contact with children on a regular basis  yes  She has had chicken pox in the past no She has had Tuberculosis exposures, symptoms, or previously tested positive for TB   no Current or past history of domestic violence. no  Genetic Screening/Teratology Counseling: (Includes patient, baby's father, or anyone in either family with:)   1. Patient's age >/= 42 [facility]  no 2. Thalassemia ([place], [place], Mediterranean, or Asian background): MCV<80  no 3. Neural tube defect (meningomyelocele, spina bifida, anencephaly)  no 4. Congenital heart defect  no  5. Down syndrome  no 6. Tay-Sachs (Jewish, [place]  no 7. Canavan's Disease  no 8. Sickle cell disease or  trait (African)  no  9. Hemophilia or other blood disorders  no  10. Muscular dystrophy  no  11. Cystic fibrosis  no  12. Huntington's Chorea  no  13. Mental retardation/autism  no 14. Other inherited genetic or chromosomal disorder  no 15. Maternal metabolic disorder (DM, PKU, etc)  no 16. Patient or FOB with a child with a birth defect not listed above no  16a. Patient or FOB with a birth defect themselves no 17. Recurrent pregnancy loss, or stillbirth  no  18. Any medications since LMP other than prenatal vitamins (include vitamins, supplements, OTC meds, drugs, alcohol)  no 19. Any other genetic/environmental exposure to discuss  no  Infection History:   1. Lives with someone with TB or TB exposed  no  2. Patient or partner has history of genital herpes  no 3. Rash or viral illness since LMP  no 4. History of STI (GC, CT, HPV, syphilis, HIV)  Yes  GC in the past 5. History of recent travel :  no  Other pertinent information:  no     Review of Systems:10 point review of systems negative unless otherwise noted in HPI  Past Medical History:  Past Medical History:  Diagnosis Date  . Anxiety   . Depression    depression and anxiety  . Fatigue   . Mucous in stools   . Unintentional weight loss   . Vaginal Pap smear, abnormal     Past Surgical  History:  Past Surgical History:  Procedure Laterality Date  . CESAREAN SECTION    . CESAREAN SECTION N/A 04/02/2018   Procedure: CESAREAN SECTION;  Surgeon: Conard Novak, MD;  Location: [facility] ORS;  Service: Obstetrics;  Laterality: N/A;  . FINGER SURGERY Right    2/2 trauma    Gynecologic History: Patient's last menstrual period was 01/28/2020 (approximate).  Obstetric History: [id number]  Family History:  Family History  Problem Relation Age of Onset  . Diabetes Maternal Grandfather   . Hypertension Maternal Grandfather   . Stroke Maternal Grandfather   . Lung cancer Paternal Grandmother   . COPD Maternal Grandmother    . Lung cancer Maternal Grandmother     Social History:  Social History   Socioeconomic History  . Marital status: Married    Spouse name: Not on file  . Number of children: Not on file  . Years of education: Not on file  . Highest education level: Not on file  Occupational History  . Not on file  Tobacco Use  . Smoking status: Never Smoker  . Smokeless tobacco: Never Used  Vaping Use  . Vaping Use: Never used  Substance and Sexual Activity  . Alcohol use: Not Currently    Comment: ocassionaly  . Drug use: Not Currently    Types: Marijuana  . Sexual activity: Yes    Birth control/protection: None  Other Topics Concern  . Not on file  Social History Narrative  . Not on file   Social Determinants of Health   Financial Resource Strain: Not on file  Food Insecurity: Not on file  Transportation Needs: Not on file  Physical Activity: Not on file  Stress: Not on file  Social Connections: Not on file  Intimate Partner Violence: Not At Risk  . Fear of Current or Ex-Partner: No  . Emotionally Abused: No  . Physically Abused: No  . Sexually Abused: No    Allergies:  No Known Allergies  Medications: Prior to Admission medications   Medication Sig Start Date End Date Taking? Authorizing Provider  Prenatal Vit-Fe Fumarate-FA (PRENATAL VITAMINS) 28-0.8 MG TABS Take 28 mg by mouth daily. 04/01/20 06/30/20 Yes Federico Flake, MD    Physical Exam Vitals: Blood pressure 114/70, weight 105 lb (47.6 kg), last menstrual period 01/28/2020.  General: NAD HEENT: normocephalic, anicteric Thyroid: no enlargement, no palpable nodules Pulmonary: No increased work of breathing, CTAB Cardiovascular: RRR, distal pulses 2+ Abdomen: NABS, soft, non-tender, non-distended.  Umbilicus without lesions.  No hepatomegaly, splenomegaly or masses palpable. No evidence of hernia  Genitourinary:  External: Normal external female genitalia.  Normal urethral meatus, normal  Bartholin's and  Skene's glands.    Vagina: Normal vaginal mucosa, no evidence of prolapse.    Cervix: Grossly normal in appearance, no bleeding  Uterus: anteverted, slightly enlarged, mobile, normal contour.  No CMT  Adnexa: ovaries non-enlarged, no adnexal masses  Rectal: deferred Extremities: no edema, erythema, or tenderness Neurologic: Grossly intact Psychiatric: mood appropriate, affect full   Assessment: 30 y.o. [id number] at [redacted]w[redacted]d presenting to initiate prenatal care  Plan: 1) Avoid alcoholic beverages. 2) Patient encouraged not to smoke.  3) Discontinue the use of all non-medicinal drugs and chemicals.  4) Take prenatal vitamins daily.  5) Nutrition, food safety (fish, cheese advisories, and high nitrite foods) and exercise discussed. 6) Hospital and practice style discussed with cross coverage system.  7) Genetic Screening, such as with 1st Trimester Screening, cell free fetal DNA, AFP testing, and Ultrasound, as well  as with amniocentesis and CVS as appropriate, is discussed with patient. At the conclusion of today's visit patient requested genetic testing. She would like the MaternT testing drawn at her next appointment. 8) Patient is asked about travel to areas at risk for the Zika virus, and counseled to avoid travel and exposure to mosquitoes or sexual partners who may have themselves been exposed to the virus. Testing is discussed, and will be ordered as appropriate.   We discussed her low mood, mixed depressive and anxious symptoms.She is interested in starting on an SSRI. We reviewed the benefits, low known risks and the side effects of SSRI medication. A trial of Lexapro is prescribed for her.   RTC in 4 weeks for bloodwork, a [name] and MaternT testing.

## 2020-04-15 NOTE — Progress Notes (Signed)
NOB today. LMP: unsure but had u/s this past weekend at ER for vaginal bleeding.

## 2020-04-17 LAB — CYTOLOGY - PAP
Chlamydia: NEGATIVE
Comment: NEGATIVE
Comment: NEGATIVE
Comment: NEGATIVE
Comment: NORMAL
Diagnosis: NEGATIVE
High risk HPV: NEGATIVE
Neisseria Gonorrhea: NEGATIVE
Trichomonas: NEGATIVE

## 2020-04-17 LAB — URINE CULTURE

## 2020-04-21 ENCOUNTER — Encounter: Payer: BLUE CROSS/BLUE SHIELD | Admitting: Obstetrics and Gynecology

## 2020-04-25 ENCOUNTER — Encounter: Payer: Self-pay | Admitting: Obstetrics

## 2020-04-25 ENCOUNTER — Other Ambulatory Visit: Payer: Self-pay

## 2020-04-25 DIAGNOSIS — F1290 Cannabis use, unspecified, uncomplicated: Secondary | ICD-10-CM | POA: Insufficient documentation

## 2020-04-25 LAB — URINE DRUG PANEL 7
Amphetamines, Urine: NEGATIVE ng/mL
Barbiturate Quant, Ur: NEGATIVE ng/mL
Benzodiazepine Quant, Ur: NEGATIVE ng/mL
Cannabinoid Quant, Ur: POSITIVE — AB
Cocaine (Metab.): NEGATIVE ng/mL
Opiate Quant, Ur: NEGATIVE ng/mL
PCP Quant, Ur: NEGATIVE ng/mL

## 2020-04-25 MED ORDER — VITAFOL GUMMIES 3.33-0.333-34.8 MG PO CHEW: 1.0000 | CHEWABLE_TABLET | Freq: Every day | ORAL | 3 refills | Status: DC

## 2020-04-25 NOTE — Telephone Encounter (Signed)
Called pt; adv vitafol rx was sent; she asked for samples; she is also going to check to see if m'caid covers them.

## 2020-04-25 NOTE — Telephone Encounter (Signed)
Pt calling; needs refill of vitapol gummies pnv.  [phone]

## 2020-04-30 NOTE — Telephone Encounter (Signed)
Msg closed; haven't heard back from pt.

## 2020-05-06 ENCOUNTER — Encounter: Payer: Self-pay | Admitting: Advanced Practice Midwife

## 2020-05-06 ENCOUNTER — Other Ambulatory Visit (HOSPITAL_COMMUNITY)
Admission: RE | Admit: 2020-05-06 | Discharge: 2020-05-06 | Disposition: A | Discharge status: Home / Self Care | Payer: Medicaid Other | Source: Ambulatory Visit | Attending: Advanced Practice Midwife | Admitting: Advanced Practice Midwife

## 2020-05-06 ENCOUNTER — Other Ambulatory Visit: Payer: Self-pay

## 2020-05-06 ENCOUNTER — Ambulatory Visit (INDEPENDENT_AMBULATORY_CARE_PROVIDER_SITE_OTHER): Payer: Medicaid Other | Admitting: Advanced Practice Midwife

## 2020-05-06 VITALS — BP 110/68 | Wt 107.0 lb

## 2020-05-06 DIAGNOSIS — N898 Other specified noninflammatory disorders of vagina: Secondary | ICD-10-CM | POA: Insufficient documentation

## 2020-05-06 DIAGNOSIS — O26892 Other specified pregnancy related conditions, second trimester: Secondary | ICD-10-CM

## 2020-05-06 DIAGNOSIS — B373 Candidiasis of vulva and vagina: Secondary | ICD-10-CM

## 2020-05-06 DIAGNOSIS — Z3A10 10 weeks gestation of pregnancy: Secondary | ICD-10-CM

## 2020-05-06 DIAGNOSIS — Z3480 Encounter for supervision of other normal pregnancy, unspecified trimester: Secondary | ICD-10-CM

## 2020-05-06 DIAGNOSIS — B3731 Acute candidiasis of vulva and vagina: Secondary | ICD-10-CM

## 2020-05-06 MED ORDER — TERCONAZOLE 0.8 % VA CREA: 1.0000 | TOPICAL_CREAM | Freq: Every day | VAGINAL | 0 refills | Status: DC

## 2020-05-06 NOTE — Progress Notes (Signed)
Routine Prenatal Care Visit  Subjective  Jessica Mitchell is a 30 y.o. G3P1103 at [redacted]w[redacted]d being seen today for ongoing prenatal care.  She is currently monitored for the following issues for this low-risk pregnancy and has Depression; Anxiety; History of cesarean section; History of cesarean delivery; Acute blood loss anemia; History of abnormal cervical Pap smear; Supervision of other normal pregnancy, antepartum; and Marijuana user on their problem list.  ----------------------------------------------------------------------------------- Patient reports vaginal discharge and itching for the past few days. The discharge is thick and white. She has not tried OTC remedy.    . Vag. Bleeding: None.   . Leaking Fluid denies.  ----------------------------------------------------------------------------------- The following portions of the patient's history were reviewed and updated as appropriate: allergies, current medications, past family history, past medical history, past social history, past surgical history and problem list. Problem list updated.  Objective  Blood pressure 110/68, weight 107 lb (48.5 kg), last menstrual period 01/28/2020. Pregravid weight 105 lb (47.6 kg) Total Weight Gain 2 lb (0.907 kg) Urinalysis: Urine Protein    Urine Glucose     Wet Prep: positive for yeast  Fetal Status: Fetal Heart Rate (bpm): present, unable to assess by doppler  Bedside ultrasound: fetal movement present, heart rate appears wnl  General:  Alert, oriented and cooperative. Patient is in no acute distress.  Skin: Skin is warm and dry. No rash noted.   Cardiovascular: Normal heart rate noted  Respiratory: Normal respiratory effort, no problems with respiration noted  Abdomen: Soft, gravid, appropriate for gestational age.       Pelvic:  Cervical exam deferred      thick white clumpy discharge present  Extremities: Normal range of motion.  Edema: None  Mental Status: Normal mood and affect. Normal  behavior. Normal judgment and thought content.   Assessment   30 y.o. G6K5993 at 110w0d by  12/02/2020, by Ultrasound presenting for work-in prenatal visit  Plan   pregnancy Problems (from 04/15/20 to present)    Problem Noted Resolved   Marijuana user 04/25/2020 by Jessica Mitchell, CNM No   Supervision of other normal pregnancy, antepartum 04/15/2020 by Jessica Mitchell, CNM No   Overview Addendum 04/23/2020 12:22 PM by Jessica Mitchell, CNM     Clinic  Prenatal Labs  Dating  Blood type: --/--/O NEG Performed at [facility], [address] [place], [place] [address]  [phone])   Genetic Screen 1 Screen:    AFP:     Quad:     NIPS: Antibody:NEG Performed at [facility], [address] [place], [place] [address]  [phone])  Anatomic [place]  Rubella:    GTT Early:               Third trimester:  RPR:     Flu vaccine  HBsAg:     TDaP vaccine                                               Rhogam: HIV:     Baby Food          Breast                                     GBS: (For PCN allergy, check sensitivities)  Contraception  Pap:04/15/2020 NILM  Circumcision    Pediatrician    Support Person Jessica Mitchell             Previous Version    Vaginitis aptima sent Rx Terconazole 3  Preterm labor symptoms and general obstetric precautions including but not limited to vaginal bleeding, contractions, leaking of fluid and fetal movement were reviewed in detail with the patient.    Return for scheduled prenatal appointment.   Jessica Mitchell, CNM 05/06/2020 3:25 PM

## 2020-05-08 ENCOUNTER — Telehealth: Payer: Self-pay

## 2020-05-08 ENCOUNTER — Other Ambulatory Visit: Payer: Self-pay | Admitting: Advanced Practice Midwife

## 2020-05-08 LAB — CERVICOVAGINAL ANCILLARY ONLY
Bacterial Vaginitis (gardnerella): NEGATIVE
Candida Glabrata: NEGATIVE
Candida Vaginitis: POSITIVE — AB
Comment: NEGATIVE
Comment: NEGATIVE
Comment: NEGATIVE

## 2020-05-08 NOTE — Telephone Encounter (Signed)
Patient has purchased and tried OTC heartburn meds. They no longer work. She feels like the inside of her stomach is on fire. Inquiring if she can get rx for heartburn/acid reflux. PX#[phone]

## 2020-05-08 NOTE — Telephone Encounter (Signed)
Looks like someone prescribed prilosec for her 2 days ago.

## 2020-05-09 ENCOUNTER — Other Ambulatory Visit: Payer: Self-pay | Admitting: Advanced Practice Midwife

## 2020-05-09 DIAGNOSIS — O26891 Other specified pregnancy related conditions, first trimester: Secondary | ICD-10-CM

## 2020-05-09 MED ORDER — PANTOPRAZOLE SODIUM 20 MG PO TBEC
20.0000 mg | DELAYED_RELEASE_TABLET | Freq: Every day | ORAL | 1 refills | Status: DC
Start: 2020-05-09 — End: 2020-07-09

## 2020-05-09 NOTE — Telephone Encounter (Signed)
I sent Rx for Protonix. She can try sips of milk which may help. Sit up after eating. Avoid triggers.

## 2020-05-09 NOTE — Telephone Encounter (Signed)
Prilosec was recorded at her 05/06/20 visit as she mentioned she had been taking it OTC. No rx was sent.

## 2020-05-09 NOTE — Progress Notes (Signed)
Rx protonix sent per patient request

## 2020-05-12 NOTE — Telephone Encounter (Signed)
Patient aware. She picked up rx and has been taking this weekend. It is helping!

## 2020-05-13 ENCOUNTER — Other Ambulatory Visit: Payer: Self-pay | Admitting: Obstetrics

## 2020-05-13 ENCOUNTER — Encounter: Payer: Self-pay | Admitting: Advanced Practice Midwife

## 2020-05-13 ENCOUNTER — Ambulatory Visit (INDEPENDENT_AMBULATORY_CARE_PROVIDER_SITE_OTHER): Payer: Medicaid Other | Admitting: Advanced Practice Midwife

## 2020-05-13 ENCOUNTER — Other Ambulatory Visit: Payer: Self-pay

## 2020-05-13 VITALS — BP 116/70 | Wt 107.0 lb

## 2020-05-13 DIAGNOSIS — Z3A11 11 weeks gestation of pregnancy: Secondary | ICD-10-CM

## 2020-05-13 DIAGNOSIS — Z3480 Encounter for supervision of other normal pregnancy, unspecified trimester: Secondary | ICD-10-CM

## 2020-05-13 DIAGNOSIS — Z1379 Encounter for other screening for genetic and chromosomal anomalies: Secondary | ICD-10-CM

## 2020-05-13 DIAGNOSIS — Z130 Encounter for screening for diseases of the blood and blood-forming organs and certain disorders involving the immune mechanism: Secondary | ICD-10-CM

## 2020-05-13 DIAGNOSIS — Z3481 Encounter for supervision of other normal pregnancy, first trimester: Secondary | ICD-10-CM

## 2020-05-13 DIAGNOSIS — Z369 Encounter for antenatal screening, unspecified: Secondary | ICD-10-CM

## 2020-05-13 NOTE — Progress Notes (Signed)
Routine Prenatal Care Visit  Subjective  Jessica Mitchell is a 30 y.o. [phone] at [redacted]w[redacted]d being seen today for ongoing prenatal care.  She is currently monitored for the following issues for this low-risk pregnancy and has Depression; Anxiety; History of cesarean section; History of cesarean delivery; Acute blood loss anemia; History of abnormal cervical Pap smear; Supervision of other normal pregnancy, antepartum; and Marijuana user on their problem list.  ----------------------------------------------------------------------------------- Patient reports no complaints.    . Vag. Bleeding: None.   . Leaking Fluid denies.  ----------------------------------------------------------------------------------- The following portions of the patient's history were reviewed and updated as appropriate: allergies, current medications, past family history, past medical history, past social history, past surgical history and problem list. Problem list updated.  Objective  Blood pressure 116/70, weight 107 lb (48.5 kg), last menstrual period 01/28/2020. Pregravid weight 105 lb (47.6 kg) Total Weight Gain 2 lb (0.907 kg) Urinalysis: Urine Protein    Urine Glucose    Fetal Status: Fetal Heart Rate (bpm): 164         General:  Alert, oriented and cooperative. Patient is in no acute distress.  Skin: Skin is warm and dry. No rash noted.   Cardiovascular: Normal heart rate noted  Respiratory: Normal respiratory effort, no problems with respiration noted  Abdomen: Soft, gravid, appropriate for gestational age.       Pelvic:  Cervical exam deferred        Extremities: Normal range of motion.  Edema: None  Mental Status: Normal mood and affect. Normal behavior. Normal judgment and thought content.   Assessment   30 y.o. [id number] at [redacted]w[redacted]d by  12/02/2020, by Ultrasound presenting for routine prenatal visit  Plan   pregnancy Problems (from 04/15/20 to present)    Problem Noted Resolved   Marijuana user  04/25/2020 by Mirna Mires, CNM No   Supervision of other normal pregnancy, antepartum 04/15/2020 by Mirna Mires, CNM No   Overview Addendum 04/23/2020 12:22 PM by Mirna Mires, CNM     Clinic  Prenatal Labs  Dating  Blood type: --/--/O NEG Performed at [facility], [address] [place], [place] [address]  [phone])   Genetic Screen 1 Screen:    AFP:     Quad:     NIPS: Antibody:NEG Performed at [facility], [address] [place], [place] [address]  [phone])  Anatomic [place]  Rubella:    GTT Early:               Third trimester:  RPR:     Flu vaccine  HBsAg:     TDaP vaccine                                               Rhogam: HIV:     Baby Food          Breast                                     GBS: (For PCN allergy, check sensitivities)  Contraception  Pap:04/15/2020 NILM  Circumcision    Pediatrician    Support Person Quinton             Previous Version       Preterm labor symptoms and general obstetric  precautions including but not limited to vaginal bleeding, contractions, leaking of fluid and fetal movement were reviewed in detail with the patient. Please refer to After Visit Summary for other counseling recommendations.   Return in about 4 weeks (around 06/10/2020) for rob.  Tresea Mall, CNM 05/13/2020 9:24 AM

## 2020-05-13 NOTE — Patient Instructions (Signed)

## 2020-05-13 NOTE — Progress Notes (Signed)
No vb. No lof. Labd today

## 2020-05-15 ENCOUNTER — Telehealth: Payer: Self-pay

## 2020-05-15 NOTE — Telephone Encounter (Signed)
Pt calling; has had the same H/A for 3d; tylenol is not helping.  [phone]  Pt aware H/As can be caused by the hormonal changes in her body; may take 2 e.s. tylenol and is allowed 16oz of caffeine a day incl choc; apply ice to affected area; lie in dark room.

## 2020-05-16 LAB — RPR+RH+ABO+RUB AB+AB SCR+CB...
HIV Screen 4th Generation wRfx: NONREACTIVE
Hematocrit: 41.9 % (ref 34.0–46.6)
Hemoglobin: 14.2 g/dL (ref 11.1–15.9)
Hepatitis B Surface Ag: NEGATIVE
MCH: 31.5 pg (ref 26.6–33.0)
MCHC: 33.9 g/dL (ref 31.5–35.7)
MCV: 93 fL (ref 79–97)
Platelets: 208 10*3/uL (ref 150–450)
RBC: 4.51 x10E6/uL (ref 3.77–5.28)
RDW: 13 % (ref 11.7–15.4)
RPR Ser Ql: NONREACTIVE
Rh Factor: NEGATIVE
Rubella Antibodies, IGG: 3.54 index (ref >0.99)
Varicella zoster IgG: 895 index (ref >165)
WBC: 11.6 10*3/uL — ABNORMAL HIGH (ref 3.4–10.8)

## 2020-05-16 LAB — HGB FRACTIONATION CASCADE
Hgb A2: 2.5 % (ref 1.8–3.2)
Hgb A: 97.5 % (ref 96.4–98.8)
Hgb F: 0 % (ref 0.0–2.0)
Hgb S: 0 %

## 2020-05-16 LAB — AB SCR+ANTIBODY ID: Antibody Screen: POSITIVE — AB

## 2020-05-19 LAB — MATERNIT21 PLUS CORE+SCA
Fetal Fraction: 10
Monosomy X (Turner Syndrome): NOT DETECTED
Result (T21): NEGATIVE
Trisomy 13 (Patau syndrome): NEGATIVE
Trisomy 18 (Edwards syndrome): NEGATIVE
Trisomy 21 (Down syndrome): NEGATIVE
XXX (Triple X Syndrome): NOT DETECTED
XXY (Klinefelter Syndrome): NOT DETECTED
XYY (Jacobs Syndrome): NOT DETECTED

## 2020-05-20 ENCOUNTER — Telehealth: Payer: Self-pay

## 2020-05-20 NOTE — Telephone Encounter (Signed)
Pt called to make sure Mat 21 results was normal. Pt aware

## 2020-06-10 ENCOUNTER — Ambulatory Visit (INDEPENDENT_AMBULATORY_CARE_PROVIDER_SITE_OTHER): Payer: Medicaid Other | Admitting: Obstetrics

## 2020-06-10 ENCOUNTER — Other Ambulatory Visit: Payer: Self-pay

## 2020-06-10 VITALS — BP 100/60 | Wt 113.0 lb

## 2020-06-10 DIAGNOSIS — Z3481 Encounter for supervision of other normal pregnancy, first trimester: Secondary | ICD-10-CM

## 2020-06-10 DIAGNOSIS — O26892 Other specified pregnancy related conditions, second trimester: Secondary | ICD-10-CM

## 2020-06-10 DIAGNOSIS — Z3A15 15 weeks gestation of pregnancy: Secondary | ICD-10-CM

## 2020-06-10 DIAGNOSIS — Z3A19 19 weeks gestation of pregnancy: Secondary | ICD-10-CM

## 2020-06-10 DIAGNOSIS — R300 Dysuria: Secondary | ICD-10-CM

## 2020-06-10 DIAGNOSIS — B373 Candidiasis of vulva and vagina: Secondary | ICD-10-CM

## 2020-06-10 DIAGNOSIS — B3731 Acute candidiasis of vulva and vagina: Secondary | ICD-10-CM

## 2020-06-10 DIAGNOSIS — Z3480 Encounter for supervision of other normal pregnancy, unspecified trimester: Secondary | ICD-10-CM

## 2020-06-10 DIAGNOSIS — N949 Unspecified condition associated with female genital organs and menstrual cycle: Secondary | ICD-10-CM

## 2020-06-10 DIAGNOSIS — Z3A13 13 weeks gestation of pregnancy: Secondary | ICD-10-CM

## 2020-06-10 LAB — POCT URINALYSIS DIPSTICK OB
Glucose, UA: NEGATIVE
POC,PROTEIN,UA: NEGATIVE

## 2020-06-10 LAB — POCT URINALYSIS DIPSTICK
Bilirubin, UA: NEGATIVE
Blood, UA: NEGATIVE
Glucose, UA: NEGATIVE
Ketones, UA: NEGATIVE
Nitrite, UA: NEGATIVE
Protein, UA: NEGATIVE
Spec Grav, UA: 1.01 (ref 1.010–1.025)
Urobilinogen, UA: 0.2 E.U./dL
pH, UA: 5 (ref 5.0–8.0)

## 2020-06-10 MED ORDER — NITROFURANTOIN MONOHYD MACRO 100 MG PO CAPS: 100.0000 mg | ORAL_CAPSULE | Freq: Two times a day (BID) | ORAL | 1 refills | Status: DC

## 2020-06-10 MED ORDER — FLUCONAZOLE 150 MG PO TABS: 150.0000 mg | ORAL_TABLET | Freq: Once | ORAL | 0 refills | Status: AC

## 2020-06-10 NOTE — Progress Notes (Addendum)
Routine Prenatal Care Visit  Subjective  Jessica Mitchell is a 30 y.o. [phone] at [redacted]w[redacted]d being seen today for ongoing prenatal care.  She is currently monitored for the following issues for this high-risk pregnancy and has Depression; Anxiety; History of cesarean section; History of cesarean delivery; Acute blood loss anemia; History of abnormal cervical Pap smear; Supervision of other normal pregnancy, antepartum; and Marijuana user on their problem list.  ----------------------------------------------------------------------------------- Patient reports no bleeding, no contractions, no leaking and vaginal irritation.  She was recently treated for yeast,but is now having some dysuria and external irritation with urination. She is also expressing a desire for a TOLAC. Aware that she needs a consultation with MD  . Lockie Pares. Bleeding: None.   . Leaking Fluid denies.  ----------------------------------------------------------------------------------- The following portions of the patient's history were reviewed and updated as appropriate: allergies, current medications, past family history, past medical history, past social history, past surgical history and problem list. Problem list updated.  Objective  Blood pressure 100/60, weight 113 lb (51.3 kg), last menstrual period 01/28/2020. Pregravid weight 105 lb (47.6 kg) Total Weight Gain 8 lb (3.629 kg) Urinalysis: Urine Protein Negative  Urine Glucose Negative  Fetal Status:           General:  Alert, oriented and cooperative. Patient is in no acute distress.  Skin: Skin is warm and dry. No rash noted.   Cardiovascular: Normal heart rate noted  Respiratory: Normal respiratory effort, no problems with respiration noted  Abdomen: Soft, gravid, appropriate for gestational age.       Pelvic:  Cervical exam deferred        Extremities: Normal range of motion.  Edema: None  Mental Status: Normal mood and affect. Normal behavior. Normal judgment and  thought content.   Assessment   30 y.o. [id number] at [redacted]w[redacted]d by  12/02/2020, by Ultrasound presenting for routine prenatal visit  Plan   pregnancy Problems (from 04/15/20 to present)    Problem Noted Resolved   Marijuana user 04/25/2020 by Mirna Mires, CNM No   Supervision of other normal pregnancy, antepartum 04/15/2020 by Mirna Mires, CNM No   Overview Addendum 06/10/2020 10:22 AM by Mirna Mires, CNM     Clinic  Prenatal Labs  Dating EDD based on ultrasound 12/02/2020 Blood type: --/--/O NEG Performed at [facility], [address] [place], [place] [address]  [phone])   Genetic Screen 1 Screen:    AFP:     Quad:     NIPS: Antibody:NEG Performed at [facility], [address] [place], [place] [address]  [phone])  Anatomic [place]  Rubella:    GTT Early:               Third trimester:  RPR:     Flu vaccine  HBsAg:     TDaP vaccine                                               Rhogam: HIV:     Baby Food          Breast                                     GBS: (For PCN allergy, check sensitivities)  Contraception  Pap:04/15/2020 NILM  Circumcision    Pediatrician    Support Person Mena Goes             Previous Version       Preterm labor symptoms and general obstetric precautions including but not limited to vaginal bleeding, contractions, leaking of fluid and fetal movement were reviewed in detail with the patient. Please refer to After Visit Summary for other counseling recommendations.  Will start her on Macrobid. Also provided Rx for Diflucan. She is very interested in breastfeeding her baby. She did not nurse her other children. Referred to the class at [facility] and discussed Baby Friendly practices today. Discussed ehr strong desire for TOLAC. Encouraged her to make an appointment with an MD to discuss this and calculate a success percentage. Return in about 4 weeks (around 07/08/2020) for return OB, anatomy scan.  Mirna Mires,  CNM  06/10/2020 10:41 AM

## 2020-06-12 LAB — URINE CULTURE, OB REFLEX

## 2020-06-12 LAB — CULTURE, OB URINE

## 2020-06-20 ENCOUNTER — Telehealth: Payer: Self-pay

## 2020-06-20 NOTE — Telephone Encounter (Signed)
Pt calling; wants to know if it is okay for her to get the flu shot; her job requires it.  [phone]  Adv she can get the flu shot.

## 2020-07-02 ENCOUNTER — Ambulatory Visit: Payer: Medicaid Other

## 2020-07-06 ENCOUNTER — Other Ambulatory Visit: Payer: Self-pay | Admitting: Advanced Practice Midwife

## 2020-07-06 DIAGNOSIS — O26891 Other specified pregnancy related conditions, first trimester: Secondary | ICD-10-CM

## 2020-07-08 ENCOUNTER — Telehealth: Payer: Self-pay

## 2020-07-08 MED ORDER — OMEPRAZOLE 10 MG PO CPDR: 10.0000 mg | DELAYED_RELEASE_CAPSULE | Freq: Every day | ORAL | 0 refills | Status: DC

## 2020-07-09 ENCOUNTER — Other Ambulatory Visit: Payer: Self-pay | Admitting: Obstetrics

## 2020-07-09 DIAGNOSIS — O26891 Other specified pregnancy related conditions, first trimester: Secondary | ICD-10-CM

## 2020-07-09 MED ORDER — PANTOPRAZOLE SODIUM 20 MG PO TBEC: 20.0000 mg | DELAYED_RELEASE_TABLET | Freq: Every day | ORAL | 1 refills | Status: DC

## 2020-07-09 NOTE — Telephone Encounter (Signed)
Pt aware.

## 2020-07-09 NOTE — Telephone Encounter (Signed)
Pt calling; was rx'd omeprazole capsules; doesn't do well with capsules; pharm said there is a tablet form.  Could she have the tablet form?  [phone]

## 2020-07-18 ENCOUNTER — Ambulatory Visit (INDEPENDENT_AMBULATORY_CARE_PROVIDER_SITE_OTHER): Payer: Medicaid Other | Admitting: Obstetrics and Gynecology

## 2020-07-18 ENCOUNTER — Encounter: Payer: Self-pay | Admitting: Obstetrics and Gynecology

## 2020-07-18 ENCOUNTER — Other Ambulatory Visit: Payer: Self-pay

## 2020-07-18 VITALS — BP 98/60 | Wt 124.0 lb

## 2020-07-18 DIAGNOSIS — Z98891 History of uterine scar from previous surgery: Secondary | ICD-10-CM

## 2020-07-18 DIAGNOSIS — Z3482 Encounter for supervision of other normal pregnancy, second trimester: Secondary | ICD-10-CM

## 2020-07-18 DIAGNOSIS — Z3A20 20 weeks gestation of pregnancy: Secondary | ICD-10-CM

## 2020-07-18 LAB — POCT URINALYSIS DIPSTICK OB
Glucose, UA: NEGATIVE
POC,PROTEIN,UA: NEGATIVE

## 2020-07-18 NOTE — Progress Notes (Signed)
Routine Prenatal Care Visit  Subjective  Jessica Mitchell is a 30 y.o. [phone] at [redacted]w[redacted]d being seen today for ongoing prenatal care.  She is currently monitored for the following issues for this low-risk pregnancy and has Depression; Anxiety; History of cesarean section; History of cesarean delivery; Acute blood loss anemia; History of abnormal cervical Pap smear; Supervision of other normal pregnancy, antepartum; and Marijuana user on their problem list.  ----------------------------------------------------------------------------------- Patient reports no complaints.   Contractions: Not present. Vag. Bleeding: None.  Movement: Present. Leaking Fluid denies.  ----------------------------------------------------------------------------------- The following portions of the patient's history were reviewed and updated as appropriate: allergies, current medications, past family history, past medical history, past social history, past surgical history and problem list. Problem list updated.  Objective  Blood pressure 98/60, weight 124 lb (56.2 kg), last menstrual period 01/28/2020. Pregravid weight 105 lb (47.6 kg) Total Weight Gain 19 lb (8.618 kg) Urinalysis: Urine Protein Negative  Urine Glucose Negative  Fetal Status: Fetal Heart Rate (bpm): 155   Movement: Present     General:  Alert, oriented and cooperative. Patient is in no acute distress.  Skin: Skin is warm and dry. No rash noted.   Cardiovascular: Normal heart rate noted  Respiratory: Normal respiratory effort, no problems with respiration noted  Abdomen: Soft, gravid, appropriate for gestational age. Pain/Pressure: Absent     Pelvic:  Cervical exam deferred        Extremities: Normal range of motion.  Edema: None  Mental Status: Normal mood and affect. Normal behavior. Normal judgment and thought content.   Assessment   30 y.o. [id number] at [redacted]w[redacted]d by  12/02/2020, by Ultrasound presenting for routine prenatal visit  Plan    pregnancy Problems (from 04/15/20 to present)    Problem Noted Resolved   Marijuana user 04/25/2020 by Mirna Mires, CNM No   Supervision of other normal pregnancy, antepartum 04/15/2020 by Mirna Mires, CNM No   Overview Addendum 07/18/2020 10:50 AM by Conard Novak, MD     Clinic [facility] Prenatal Labs  Dating EDD based on ultrasound 12/02/2020 Blood type: O/Negative/-- (03/22 1048)   Genetic Screen 1 Screen:    AFP:     Quad:     NIPS: Antibody:Positive, See Final Results (03/22 1048)  Anatomic [place] 07/29/20 scheduled Rubella: 3.54 (03/22 1048)  GTT Early:               Third trimester:  RPR: Non Reactive (03/22 1048)   Flu vaccine  HBsAg: Negative (03/22 1048)   TDaP vaccine                                               Rhogam: HIV: Non Reactive (03/22 1048)   Baby Food          Breast                                     GBS: (For PCN allergy, check sensitivities)  Contraception  Pap:04/15/2020 NILM  Circumcision    Pediatrician    Support Person Mena Goes             Previous Version       Preterm labor symptoms and general obstetric precautions including but not limited to vaginal bleeding, contractions, leaking of fluid and fetal  movement were reviewed in detail with the patient. Please refer to After Visit Summary for other counseling recommendations.   30 y.o. [id number] at [redacted]w[redacted]d with Estimated Date of Delivery: 12/02/20 was seen today in office to discuss trial of labor after cesarean section (TOLAC) versus elective repeat cesarean delivery (ERCD). The following risks were discussed with the patient.  Risk of uterine rupture at term is 0.78 percent with TOLAC and 0.22 percent with ERCD. 1 in 10 uterine ruptures will result in neonatal death or neurological injury. The benefits of a trial of labor after cesarean (TOLAC) resulting in a vaginal birth after cesarean (VBAC) include the following: shorter length of hospital stay and postpartum recovery (in most cases);  fewer complications, such as postpartum fever, wound or uterine infection, thromboembolism (blood clots in the leg or lung), need for blood transfusion and fewer neonatal breathing problems. The risks of an attempted VBAC or TOLAC include the following: Risk of failed trial of labor after cesarean (TOLAC) without a vaginal birth after cesarean (VBAC) resulting in repeat cesarean delivery (RCD) in about 20 to 40 percent of women who attempt VBAC.  Her individualized success rate using the MFMU VBAC risk calculator was not done today (no internet connection during visit).   Risk of rupture of uterus resulting in an emergency cesarean delivery. The risk of uterine rupture may be related in part to the type of uterine incision made during the first cesarean delivery. A previous transverse uterine incision has the lowest risk of rupture (0.2 to 1.5 percent risk). Vertical or T-shaped uterine incisions have a higher risk of uterine rupture (4 to 9 percent risk)The risk of fetal death is very low with both VBAC and elective repeat cesarean delivery (ERCD), but the likelihood of fetal death is higher with VBAC than with ERCD. Maternal death is very rare with either type of delivery. The risks of an elective repeat cesarean delivery (ERCD) were reviewed with the patient including but not limited to: 03/998 risk of uterine rupture which could have serious consequences, bleeding which may require transfusion; infection which may require antibiotics; injury to bowel, bladder or other surrounding organs (bowel, bladder, ureters); injury to the fetus; need for additional procedures including hysterectomy in the event of a life-threatening hemorrhage; thromboembolic phenomenon; abnormal placentation; incisional problems; death and other postoperative or anesthesia complications.    In addition we discussed that our collective office practice is to allow patient's who desire to attempt TOLAC to go into labor naturally.  There  is some limited data that rupture rate may increase past [redacted] weeks gestation, but it is reasonable for women who are strongly committed to [facility] to continue pregnancy into the 41st week.  Medical indications necessetating early delivery may arise during the course of any pregnancy.  Given the contraindication on the use of prostaglandins for use in cervical ripening,  recommendation would be to proceed with repeat cesarean for delivery for patient's with unfavorable cervix (low Bishops score) who reach 41 weeks or who otherwise have a medical indication for early delivery.   These risks and benefits are summarized on the consent form, which was reviewed with the patient during the visit.  All her questions answered and she signed a consent indicating a preference for TOLAC/ERCD. A copy of the consent was given to the patient.   Return in about 4 weeks (around 08/15/2020) for Routine Prenatal Appointment.   Thomasene Mohair, MD, [facility], [facility] 07/18/2020 11:07 AM

## 2020-07-28 ENCOUNTER — Other Ambulatory Visit: Payer: Self-pay | Admitting: Obstetrics

## 2020-07-28 DIAGNOSIS — Z3482 Encounter for supervision of other normal pregnancy, second trimester: Secondary | ICD-10-CM

## 2020-07-29 ENCOUNTER — Ambulatory Visit: Payer: Medicaid Other

## 2020-08-01 ENCOUNTER — Encounter: Payer: Medicaid Other | Admitting: Obstetrics and Gynecology

## 2020-08-18 ENCOUNTER — Encounter: Payer: Medicaid Other | Admitting: Obstetrics and Gynecology

## 2020-08-19 ENCOUNTER — Other Ambulatory Visit: Payer: Self-pay

## 2020-08-19 ENCOUNTER — Ambulatory Visit
Admission: RE | Admit: 2020-08-19 | Discharge: 2020-08-19 | Disposition: A | Discharge status: Home / Self Care | Payer: Medicaid Other | Source: Ambulatory Visit | Attending: Obstetrics | Admitting: Obstetrics

## 2020-08-19 DIAGNOSIS — Z3482 Encounter for supervision of other normal pregnancy, second trimester: Secondary | ICD-10-CM | POA: Diagnosis not present

## 2020-08-20 ENCOUNTER — Ambulatory Visit (INDEPENDENT_AMBULATORY_CARE_PROVIDER_SITE_OTHER): Payer: Medicaid Other | Admitting: Obstetrics and Gynecology

## 2020-08-20 ENCOUNTER — Encounter: Payer: Self-pay | Admitting: Obstetrics and Gynecology

## 2020-08-20 VITALS — BP 98/56 | Wt 129.0 lb

## 2020-08-20 DIAGNOSIS — Z131 Encounter for screening for diabetes mellitus: Secondary | ICD-10-CM

## 2020-08-20 DIAGNOSIS — Z113 Encounter for screening for infections with a predominantly sexual mode of transmission: Secondary | ICD-10-CM

## 2020-08-20 DIAGNOSIS — Z3482 Encounter for supervision of other normal pregnancy, second trimester: Secondary | ICD-10-CM

## 2020-08-20 DIAGNOSIS — Z3480 Encounter for supervision of other normal pregnancy, unspecified trimester: Secondary | ICD-10-CM

## 2020-08-20 DIAGNOSIS — Z98891 History of uterine scar from previous surgery: Secondary | ICD-10-CM

## 2020-08-20 LAB — POCT URINALYSIS DIPSTICK OB
Glucose, UA: NEGATIVE
POC,PROTEIN,UA: NEGATIVE

## 2020-08-20 NOTE — Progress Notes (Signed)
Routine Prenatal Care Visit  Subjective  Jessica Mitchell is a 30 y.o. [phone] at [redacted]w[redacted]d being seen today for ongoing prenatal care.  She is currently monitored for the following issues for this low-risk pregnancy and has Depression; Anxiety; History of cesarean section; History of cesarean delivery; Acute blood loss anemia; History of abnormal cervical Pap smear; Supervision of other normal pregnancy, antepartum; and Marijuana user on their problem list.  ----------------------------------------------------------------------------------- Patient reports no complaints.   Contractions: Not present. Vag. Bleeding: None.  Movement: Present. Leaking Fluid denies.  ----------------------------------------------------------------------------------- The following portions of the patient's history were reviewed and updated as appropriate: allergies, current medications, past family history, past medical history, past social history, past surgical history and problem list. Problem list updated.  Objective  Blood pressure (!) 98/56, weight 129 lb (58.5 kg), last menstrual period 01/28/2020. Pregravid weight 105 lb (47.6 kg) Total Weight Gain 24 lb (10.9 kg) Urinalysis: Urine Protein Negative  Urine Glucose Negative  Fetal Status: Fetal Heart Rate (bpm): 140 Fundal Height: 25 cm Movement: Present     General:  Alert, oriented and cooperative. Patient is in no acute distress.  Skin: Skin is warm and dry. No rash noted.   Cardiovascular: Normal heart rate noted  Respiratory: Normal respiratory effort, no problems with respiration noted  Abdomen: Soft, gravid, appropriate for gestational age. Pain/Pressure: Absent     Pelvic:  Cervical exam deferred        Extremities: Normal range of motion.  Edema: None  Mental Status: Normal mood and affect. Normal behavior. Normal judgment and thought content.   Assessment   30 y.o. [id number] at [redacted]w[redacted]d by  12/02/2020, by Ultrasound presenting for routine prenatal  visit  Plan   pregnancy Problems (from 04/15/20 to present)     Problem Noted Resolved   Marijuana user 04/25/2020 by Mirna Mires, CNM No   Supervision of other normal pregnancy, antepartum 04/15/2020 by Mirna Mires, CNM No   Overview Addendum 08/20/2020 12:20 PM by Conard Novak, MD     Clinic [facility] Prenatal Labs  Dating EDD based on ultrasound 12/02/2020 Blood type: O/Negative/-- (03/22 1048)   Genetic Screen 1 Screen:    AFP:     Quad:     NIPS: Antibody:Positive, See Final Results (03/22 1048)  Anatomic [place] Completed 6/28 Rubella: 3.54 (03/22 1048)  GTT Third trimester:  RPR: Non Reactive (03/22 1048)   Flu vaccine  HBsAg: Negative (03/22 1048)   TDaP vaccine                                               Rhogam: [ ]  HIV: Non Reactive (03/22 1048)   Baby Food          Breast                                     GBS: (For PCN allergy, check sensitivities)  Contraception  Pap:04/15/2020 NILM  Circumcision    Pediatrician    Support Person Qu[place]                   Preterm labor symptoms and general obstetric precautions including but not limited to vaginal bleeding, contractions, leaking of fluid and fetal movement were reviewed in detail with the patient. Please refer to  After Visit Summary for other counseling recommendations.   - anatomy complete yesterday and normal - 28 week labs next visit - schedule RCS at 28-30 week visit  Return in about 3 weeks (around 09/10/2020) for Routine Prenatal Appointment with 28 week labs.   Thomasene Mohair, MD, [facility], [facility] 08/20/2020 12:20 PM

## 2020-08-28 ENCOUNTER — Telehealth: Payer: Self-pay

## 2020-08-28 NOTE — Telephone Encounter (Signed)
Patient inquiring how many milligrams of Tylenol she can take per day and how often. [phone]

## 2020-08-28 NOTE — Telephone Encounter (Signed)
Spoke w/patient. Advised You can take as much as two extra-strength tablets, 500 milligrams each, every four hours, up to four times a day. Maximum consumption per day should be limited to 4,000 mg or less. Also advised can try heating pad. Patient states she is using a heating pad now. Her legs are better but her back still hurts. She will try tylenol and warm bath and f/u tomorrow if no relief with these measures.

## 2020-09-03 ENCOUNTER — Telehealth: Payer: Self-pay

## 2020-09-03 NOTE — Telephone Encounter (Signed)
Pt calling; is 17m preg; tested positive for covid with home test; what to do?  [phone]  VM not set up yet.

## 2020-09-03 NOTE — Telephone Encounter (Signed)
Pt calling; is having trouble getting her pnv; states M'caid needs PA as to why she needs them.  [phone]

## 2020-09-03 NOTE — Telephone Encounter (Signed)
Pt returned call; adv for cough - plain robitussin/plain musinex, Hall's cough drops; for aches/pain/fever - e.s. tylenol; sinuses/nasal congestion - plain sudafed, saline nasal spray, sip hot beverages/soup; push fluids and rest; number given for HD.

## 2020-09-04 NOTE — Telephone Encounter (Signed)
Called pharm to see if there is a pnv that m'caid will cover and was adv m'caid doesn't cover any gummies; adv pt who states she has been taking these gummies the whole preg; they are working well for her; states she is not going to change b/c the others make her sick; this is the first time they haven't been covered; she called m'caid and was told they are waiting on [place] to do a Prior Auth.  Adv pt we usually did not do PA on pnv but I'll see what I can do;  PA sent at 12:22pm.

## 2020-09-05 NOTE — Telephone Encounter (Signed)
Pt calling to f/u on her pnv getting approved by ins; needs her pnv.  [phone]  Adv pt I had done a PA and it was denied saying pharm should use pt's commercial ins before billing m'caid.  Pt states she doesn't have the BCBS ins any more.  Pt is going to call the pharmacy and have the BCBS taken off; she will call m'caid also; adv she may want to call her case manager also about this and the fact that she doesn't have her m'caid yet. Adv pt that is all I know to do to try to help her.

## 2020-09-15 ENCOUNTER — Other Ambulatory Visit: Payer: Medicaid Other

## 2020-09-15 ENCOUNTER — Ambulatory Visit (INDEPENDENT_AMBULATORY_CARE_PROVIDER_SITE_OTHER): Payer: Medicaid Other | Admitting: Obstetrics and Gynecology

## 2020-09-15 ENCOUNTER — Other Ambulatory Visit: Payer: Self-pay

## 2020-09-15 VITALS — BP 98/60 | Wt 134.0 lb

## 2020-09-15 DIAGNOSIS — Z3A28 28 weeks gestation of pregnancy: Secondary | ICD-10-CM

## 2020-09-15 DIAGNOSIS — Z3480 Encounter for supervision of other normal pregnancy, unspecified trimester: Secondary | ICD-10-CM

## 2020-09-15 DIAGNOSIS — Z98891 History of uterine scar from previous surgery: Secondary | ICD-10-CM

## 2020-09-15 LAB — POCT URINALYSIS DIPSTICK OB
Glucose, UA: NEGATIVE
POC,PROTEIN,UA: NEGATIVE

## 2020-09-15 NOTE — Progress Notes (Signed)
Routine Prenatal Care Visit  Subjective  Jessica Mitchell is a 30 y.o. [phone] at [redacted]w[redacted]d being seen today for ongoing prenatal care.  She is currently monitored for the following issues for this low-risk pregnancy and has Depression; Anxiety; History of cesarean section; History of cesarean delivery; Acute blood loss anemia; History of abnormal cervical Pap smear; Supervision of other normal pregnancy, antepartum; and Marijuana user on their problem list.  ----------------------------------------------------------------------------------- Patient reports no complaints.   Contractions: Not present. Vag. Bleeding: None.  Movement: Present. Denies leaking of fluid.  ----------------------------------------------------------------------------------- The following portions of the patient's history were reviewed and updated as appropriate: allergies, current medications, past family history, past medical history, past social history, past surgical history and problem list. Problem list updated.   Objective  Blood pressure 98/60, weight 134 lb (60.8 kg), last menstrual period 01/28/2020. Pregravid weight 105 lb (47.6 kg) Total Weight Gain 29 lb (13.2 kg) Urinalysis:      Fetal Status: Fetal Heart Rate (bpm): 145 Fundal Height: 28 cm Movement: Present     General:  Alert, oriented and cooperative. Patient is in no acute distress.  Skin: Skin is warm and dry. No rash noted.   Cardiovascular: Normal heart rate noted  Respiratory: Normal respiratory effort, no problems with respiration noted  Abdomen: Soft, gravid, appropriate for gestational age. Pain/Pressure: Present     Pelvic:  Cervical exam deferred        Extremities: Normal range of motion.     ental Status: Normal mood and affect. Normal behavior. Normal judgment and thought content.     Assessment   30 y.o. [id number] at [redacted]w[redacted]d by  12/02/2020, by Ultrasound presenting for routine prenatal visit  Plan   pregnancy Problems (from  04/15/20 to present)     Problem Noted Resolved   Marijuana user 04/25/2020 by Mirna Mires, CNM No   Supervision of other normal pregnancy, antepartum 04/15/2020 by Mirna Mires, CNM No   Overview Addendum 08/20/2020 12:20 PM by Conard Novak, MD     [facility] Prenatal Labs  Dating EDD based on ultrasound 12/02/2020 Blood type: O/Negative/-- (03/22 1048)   Genetic Screen 1 Screen:    AFP:     Quad:     NIPS: Antibody:Positive, See Final Results (03/22 1048)  Anatomic [place] Completed 6/28 Rubella: 3.54 (03/22 1048)  GTT Third trimester:  RPR: Non Reactive (03/22 1048)   Flu vaccine  HBsAg: Negative (03/22 1048)   TDaP vaccine                                               Rhogam: [ ]  HIV: Non Reactive (03/22 1048)   Baby Food          Breast                                     GBS: (For PCN allergy, check sensitivities)  Contraception  Pap:04/15/2020 NILM  Circumcision    Pediatrician    Support Person Quinton                  Gestational age appropriate obstetric precautions including but not limited to vaginal bleeding, contractions, leaking of fluid and fetal movement were reviewed in detail with the patient.    -  no lab today 28 week labs tomorrow  Return in about 2 weeks (around 09/29/2020) for [name] (28 week labs and rhogam [nurse visit] tomorrow).  Vena Austria, MD, [facility], [facility] 09/15/2020, 10:24 AM

## 2020-09-15 NOTE — Progress Notes (Signed)
[name] - no concerns. RM 4 °

## 2020-09-16 ENCOUNTER — Other Ambulatory Visit: Payer: Medicaid Other

## 2020-09-16 DIAGNOSIS — Z131 Encounter for screening for diabetes mellitus: Secondary | ICD-10-CM

## 2020-09-16 DIAGNOSIS — Z3482 Encounter for supervision of other normal pregnancy, second trimester: Secondary | ICD-10-CM

## 2020-09-16 DIAGNOSIS — Z113 Encounter for screening for infections with a predominantly sexual mode of transmission: Secondary | ICD-10-CM

## 2020-09-17 ENCOUNTER — Telehealth: Payer: Self-pay

## 2020-09-17 LAB — 28 WEEKS RH-PANEL
Antibody Screen: NEGATIVE
Basophils Absolute: 0.1 10*3/uL (ref 0.0–0.2)
Basos: 1 %
EOS (ABSOLUTE): 0.2 10*3/uL (ref 0.0–0.4)
Eos: 2 %
Gestational Diabetes Screen: 96 mg/dL (ref 65–139)
HIV Screen 4th Generation wRfx: NONREACTIVE
Hematocrit: 33.2 % — ABNORMAL LOW (ref 34.0–46.6)
Hemoglobin: 10.8 g/dL — ABNORMAL LOW (ref 11.1–15.9)
Immature Grans (Abs): 0.2 10*3/uL — ABNORMAL HIGH (ref 0.0–0.1)
Immature Granulocytes: 2 %
Lymphocytes Absolute: 2 10*3/uL (ref 0.7–3.1)
Lymphs: 20 %
MCH: 27.6 pg (ref 26.6–33.0)
MCHC: 32.5 g/dL (ref 31.5–35.7)
MCV: 85 fL (ref 79–97)
Monocytes Absolute: 0.6 10*3/uL (ref 0.1–0.9)
Monocytes: 6 %
Neutrophils Absolute: 7.1 10*3/uL — ABNORMAL HIGH (ref 1.4–7.0)
Neutrophils: 69 %
Platelets: 141 10*3/uL — ABNORMAL LOW (ref 150–450)
RBC: 3.91 x10E6/uL (ref 3.77–5.28)
RDW: 13.1 % (ref 11.7–15.4)
RPR Ser Ql: NONREACTIVE
WBC: 10.1 10*3/uL (ref 3.4–10.8)

## 2020-09-17 NOTE — Telephone Encounter (Signed)
Pt calling; has questions about her results.  [phone] Pt's question answered.

## 2020-09-29 ENCOUNTER — Ambulatory Visit (INDEPENDENT_AMBULATORY_CARE_PROVIDER_SITE_OTHER): Payer: Medicaid Other | Admitting: Obstetrics

## 2020-09-29 ENCOUNTER — Other Ambulatory Visit: Payer: Self-pay

## 2020-09-29 DIAGNOSIS — Z3A30 30 weeks gestation of pregnancy: Secondary | ICD-10-CM | POA: Diagnosis not present

## 2020-09-29 DIAGNOSIS — Z3480 Encounter for supervision of other normal pregnancy, unspecified trimester: Secondary | ICD-10-CM

## 2020-09-29 DIAGNOSIS — Z3483 Encounter for supervision of other normal pregnancy, third trimester: Secondary | ICD-10-CM

## 2020-09-29 NOTE — Progress Notes (Signed)
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 She's on her feet all day and is experiencing hip and knee pain and feet swelling. Inquiring about if this may cause her to go into preterm labor.

## 2020-09-29 NOTE — Progress Notes (Signed)
Routine Prenatal Care Visit- Virtual Visit  Subjective   Virtual Visit via Telephone Note  I connected with@ on 09/29/20 at  9:50 AM EDT by telephone and verified that I am speaking with the correct person using two identifiers.   I discussed the limitations, risks, security and privacy concerns of performing an evaluation and management service by telephone and the availability of in person appointments. I also discussed with the patient that there may be a patient responsible charge related to this service. The patient expressed understanding and agreed to proceed.  The patient was at home I spoke with the patient from my  phone The names of people involved in this encounter were: Dorian Heckle , and Paula Compton CNM    Jessica Mitchell is a 30 y.o. [phone] at [redacted]w[redacted]d being seen today for ongoing prenatal care.  She is currently monitored for the following issues for this high-risk pregnancy and has Depression; Anxiety; History of cesarean delivery; Acute blood loss anemia; History of abnormal cervical Pap smear; Supervision of other normal pregnancy, antepartum; and Marijuana user on their problem list.  ----------------------------------------------------------------------------------- Patient reports that she is carrying the baby lower, and she has a lot of pelvic and pubic pressure..   Contractions: Irregular. Vag. Bleeding: None.  Movement: Present. Denies leaking of fluid.  ----------------------------------------------------------------------------------- The following portions of the patient's history were reviewed and updated as appropriate: allergies, current medications, past family history, past medical history, past social history, past surgical history and problem list. Problem list updated.   Objective  Last menstrual period 01/28/2020. Pregravid weight 105 lb (47.6 kg) Total Weight Gain 29 lb (13.2 kg) Urinalysis:      Fetal Status:     Movement: Present      Physical Exam could not be performed. Because of the COVID-19 outbreak this visit was performed over the phone and not in person.   Assessment   30 y.o. [id number] at [redacted]w[redacted]d by  12/02/2020, by Ultrasound presenting for routine prenatal visit  Plan   pregnancy Problems (from 04/15/20 to present)    Problem Noted Resolved   Marijuana user 04/25/2020 by Mirna Mires, CNM No   Supervision of other normal pregnancy, antepartum 04/15/2020 by Mirna Mires, CNM No   Overview Addendum 08/20/2020 12:20 PM by Conard Novak, MD     [facility] Prenatal Labs  Dating EDD based on ultrasound 12/02/2020 Blood type: O/Negative/-- (03/22 1048)   Genetic Screen 1 Screen:    AFP:     Quad:     NIPS: Antibody:Positive, See Final Results (03/22 1048)  Anatomic [place] Completed 6/28 Rubella: 3.54 (03/22 1048)  GTT Third trimester:  RPR: Non Reactive (03/22 1048)   Flu vaccine  HBsAg: Negative (03/22 1048)   TDaP vaccine                                               Rhogam: [ ]  HIV: Non Reactive (03/22 1048)   Baby Food          Breast                                     GBS: (For PCN allergy, check sensitivities)  Contraception  Pap:04/15/2020 NILM  Circumcision    Pediatrician  Support Person Mena Goes                 Gestational age appropriate obstetric precautions including but not limited to vaginal bleeding, contractions, leaking of fluid and fetal movement were reviewed in detail with the patient.     Follow Up Instructions: Advised her to use a Bella band or maternity belt for the pelvic pressure.Suggested she look at belts that provided greater support.   I discussed the assessment and treatment plan with the patient. The patient was provided an opportunity to ask questions and all were answered. The patient agreed with the plan and demonstrated an understanding of the instructions.   The patient was advised to call back or seek an in-person evaluation if the symptoms worsen  or if the condition fails to improve as anticipated.  I provided 15 minutes of non-face-to-face time during this encounter.  Return in about 2 weeks (around 10/13/2020) for return OB.  Mirna Mires, CNM  09/29/2020 5:45 PM   [facility], [facility] 09/29/2020 5:45 PM

## 2020-10-13 ENCOUNTER — Other Ambulatory Visit: Payer: Self-pay

## 2020-10-13 ENCOUNTER — Ambulatory Visit (INDEPENDENT_AMBULATORY_CARE_PROVIDER_SITE_OTHER): Payer: Medicaid Other | Admitting: Obstetrics and Gynecology

## 2020-10-13 VITALS — BP 100/60 | Wt 140.0 lb

## 2020-10-13 DIAGNOSIS — Z98891 History of uterine scar from previous surgery: Secondary | ICD-10-CM

## 2020-10-13 DIAGNOSIS — Z6791 Unspecified blood type, Rh negative: Secondary | ICD-10-CM

## 2020-10-13 DIAGNOSIS — O26893 Other specified pregnancy related conditions, third trimester: Secondary | ICD-10-CM | POA: Diagnosis not present

## 2020-10-13 DIAGNOSIS — Z23 Encounter for immunization: Secondary | ICD-10-CM | POA: Diagnosis not present

## 2020-10-13 DIAGNOSIS — Z3480 Encounter for supervision of other normal pregnancy, unspecified trimester: Secondary | ICD-10-CM

## 2020-10-13 DIAGNOSIS — Z3A32 32 weeks gestation of pregnancy: Secondary | ICD-10-CM

## 2020-10-13 MED ORDER — RHO D IMMUNE GLOBULIN 1500 UNIT/2ML IJ SOSY
300.0000 ug | PREFILLED_SYRINGE | Freq: Once | INTRAMUSCULAR | Status: AC
  Administered 2020-10-13: 300 ug via INTRAMUSCULAR

## 2020-10-13 NOTE — Progress Notes (Signed)
[name] - no concerns. RM 4 °

## 2020-10-13 NOTE — Progress Notes (Signed)
Routine Prenatal Care Visit  Subjective  Jessica Mitchell is a 30 y.o. [phone] at [redacted]w[redacted]d being seen today for ongoing prenatal care.  She is currently monitored for the following issues for this low-risk pregnancy and has Depression; Anxiety; History of cesarean delivery; Acute blood loss anemia; History of abnormal cervical Pap smear; Supervision of other normal pregnancy, antepartum; and Marijuana user on their problem list.  ----------------------------------------------------------------------------------- Patient reports no complaints.   Contractions: Irregular. Vag. Bleeding: None.  Movement: Present. Denies leaking of fluid.  ----------------------------------------------------------------------------------- The following portions of the patient's history were reviewed and updated as appropriate: allergies, current medications, past family history, past medical history, past social history, past surgical history and problem list. Problem list updated.   Objective  Blood pressure 100/60, weight 140 lb (63.5 kg), last menstrual period 01/28/2020. Pregravid weight 105 lb (47.6 kg) Total Weight Gain 35 lb (15.9 kg) Urinalysis:      Fetal Status: Fetal Heart Rate (bpm): 150 Fundal Height: 32 cm Movement: Present  Presentation: Vertex  General:  Alert, oriented and cooperative. Patient is in no acute distress.  Skin: Skin is warm and dry. No rash noted.   Cardiovascular: Normal heart rate noted  Respiratory: Normal respiratory effort, no problems with respiration noted  Abdomen: Soft, gravid, appropriate for gestational age. Pain/Pressure: Present     Pelvic:  Cervical exam deferred        Extremities: Normal range of motion.     ental Status: Normal mood and affect. Normal behavior. Normal judgment and thought content.     Assessment   30 y.o. [id number] at [redacted]w[redacted]d by  12/02/2020, by Ultrasound presenting for routine prenatal visit  Plan   pregnancy Problems (from 04/15/20 to  present)     Problem Noted Resolved   Marijuana user 04/25/2020 by Mirna Mires, CNM No   Supervision of other normal pregnancy, antepartum 04/15/2020 by Mirna Mires, CNM No   Overview Addendum 08/20/2020 12:20 PM by Conard Novak, MD     [facility] Prenatal Labs  Dating EDD based on ultrasound 12/02/2020 Blood type: O/Negative/-- (03/22 1048)   Genetic Screen NIPS: Normal XX Antibody:Positive, See Final Results (03/22 1048)  Anatomic [place] Completed 6/28 Rubella: 3.54 (03/22 1048)  GTT Third trimester: 96 RPR: Non Reactive (03/22 1048)   Flu vaccine  HBsAg: Negative (03/22 1048)   TDaP vaccine 10/13/20                                             Rhogam: [X]  10/13/20 HIV: Non Reactive (03/22 1048)   Baby Food       Breast                                     GBS: (For PCN allergy, check sensitivities)  Contraception  Pap:04/15/2020 NILM  Circumcision    Pediatrician    Support Person [place]                  Gestational age appropriate obstetric precautions including but not limited to vaginal bleeding, contractions, leaking of fluid and fetal movement were reviewed in detail with the patient.    - rhogam and TDAP today - RLTCS scheduled 10/4 SDJ  Return in about 2 weeks (around 10/27/2020) for [name].  12/27/2020,  MD, [facility], [facility] 10/13/2020, 9:46 AM

## 2020-10-15 ENCOUNTER — Telehealth: Payer: Self-pay

## 2020-10-15 NOTE — Telephone Encounter (Signed)
Called patient to schedule cesarean section w Jessica Mitchell 10/4  H&P 9/27 @  0850  Covid testing 10/3 @ 8-10, [facility], [address]. Advised pt to mask until DOS.  Pre-admit phone call appointment to be requested - date and time will be included on H&P paper work. Also all appointments will be updated on pt MyChart. Explained that this appointment has a call window. Based on the time scheduled will indicate if the call will be received within a 4 hour window before 1:00 or after.  Advised that pt may also receive calls from the hospital pharmacy and pre-service center.  Confirmed pt has Medicaid as primary insurance. No secondary insurance.

## 2020-10-15 NOTE — Telephone Encounter (Signed)
-----   Message from Vena Austria, MD sent at 10/13/2020  9:34 AM EDT ----- Regarding: C-section [facility]) Surgery Booking Request Patient Full Name:  Jessica Mitchell  MRN: [id number]  DOB: December 17, 1990  Surgeon: Thomasene Mohair, MD Requested Surgery Date and Time: 11/25/2020 7:30 Primary Diagnosis AND Code: Repeat cesarean Secondary Diagnosis and Code:  Surgical Procedure: Cesarean Section RNFA Requested?: No L&D Notification: YEs Admission Status: surgery admit Length of Surgery: 75 min Special Case Needs: No H&P: Yes Phone Interview???:  No Interpreter: No Medical Clearance:  No Special Scheduling Instructions: No Any known health/anesthesia issues, diabetes, sleep apnea, latex allergy, defibrillator/pacemaker?: No Acuity: P2   (P1 highest, P2 delay may cause harm, P3 low, elective gyn, P4 lowest) Post op follow up visits: 1 and 6 weeks

## 2020-10-22 ENCOUNTER — Observation Stay
Admission: EM | Admit: 2020-10-22 | Discharge: 2020-10-22 | Disposition: A | Discharge status: Home / Self Care | Payer: Medicaid Other | Attending: Obstetrics | Admitting: Obstetrics

## 2020-10-22 ENCOUNTER — Other Ambulatory Visit: Payer: Self-pay

## 2020-10-22 ENCOUNTER — Encounter: Payer: Self-pay | Admitting: Obstetrics and Gynecology

## 2020-10-22 DIAGNOSIS — O4703 False labor before 37 completed weeks of gestation, third trimester: Secondary | ICD-10-CM | POA: Diagnosis present

## 2020-10-22 DIAGNOSIS — Z3A34 34 weeks gestation of pregnancy: Secondary | ICD-10-CM | POA: Insufficient documentation

## 2020-10-22 DIAGNOSIS — F1290 Cannabis use, unspecified, uncomplicated: Secondary | ICD-10-CM

## 2020-10-22 DIAGNOSIS — O479 False labor, unspecified: Secondary | ICD-10-CM | POA: Diagnosis present

## 2020-10-22 DIAGNOSIS — Z3480 Encounter for supervision of other normal pregnancy, unspecified trimester: Secondary | ICD-10-CM

## 2020-10-22 NOTE — OB Triage Note (Signed)
Patient [id number] [redacted]w[redacted]d complains of contractions over the last week. She reports them getting closer together over the last week and reports having 3-5 contractions in a 30 minute period. She denies vaginal bleeding, leaking of fluid and reports positive fetal movement. She reports having an increase in discharge, but reports it being white/clear in color and denies odor and itchiness. Patient denies any other complaints. VSS. Monitors applied and assessing.

## 2020-10-22 NOTE — Final Progress Note (Signed)
Final Progress Note  Patient ID: Jessica Mitchell MRN: [id number] DOB/AGE: 1990-05-30 29 y.o.  Admit date: 10/22/2020 Admitting provider: Mirna Mires, CNM Discharge date: 10/22/2020   Admission Diagnoses: irregular contractions IUP 34 weeks 1 day.  Discharge Diagnoses:  Braxton Hicks contractions  Reactive fetal heart tones  History of Present Illness: The patient is a 30 y.o. female [phone] at [redacted]w[redacted]d who presents for evaluation for irregular contractions that have occurred on and off over the last week. 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 Due to increased need at work, she reports that she is working five shifts weekly.. She has had trouble sleeping at night as well, and last evening felt that her contractions had increased in frequency. She denies any recent IC; denies any LOF or vaginal bleeding.  Past Medical History:  Diagnosis Date   Anxiety    Depression    depression and anxiety   Fatigue    Mucous in stools    Unintentional weight loss    Vaginal Pap smear, abnormal     Past Surgical History:  Procedure Laterality Date   CESAREAN SECTION     CESAREAN SECTION N/A 04/02/2018   Procedure: CESAREAN SECTION;  Surgeon: Conard Novak, MD;  Location: [facility] ORS;  Service: Obstetrics;  Laterality: N/A;   FINGER SURGERY Right    2/2 trauma    No current facility-administered medications on file prior to encounter.   Current Outpatient Medications on File Prior to Encounter  Medication Sig Dispense Refill   pantoprazole (PROTONIX) 20 MG tablet Take 1 tablet (20 mg total) by mouth daily. 30 tablet 1   Prenatal Vit-Fe Phos-FA-Omega (VITAFOL GUMMIES) 3.33-0.333-34.8 MG CHEW Chew 1 capsule by mouth daily. 90 tablet 3    No Known Allergies  Social History   Socioeconomic History   Marital status: Married    Spouse name: Not on file   Number of children: Not on file   Years of education: Not on file   Highest education level:  Not on file  Occupational History   Not on file  Tobacco Use   Smoking status: Never   Smokeless tobacco: Never  Vaping Use   Vaping Use: Never used  Substance and Sexual Activity   Alcohol use: Not Currently    Comment: ocassionaly   Drug use: Not Currently    Types: Marijuana   Sexual activity: Yes    Birth control/protection: None  Other Topics Concern   Not on file  Social History Narrative   Not on file   Social Determinants of Health   Financial Resource Strain: Not on file  Food Insecurity: Not on file  Transportation Needs: Not on file  Physical Activity: Not on file  Stress: Not on file  Social Connections: Not on file  Intimate Partner Violence: Not At Risk   Fear of Current or Ex-Partner: No   Emotionally Abused: No   Physically Abused: No   Sexually Abused: No    Family History  Problem Relation Age of Onset   Diabetes Maternal Grandfather    Hypertension Maternal Grandfather    Stroke Maternal Grandfather    Lung cancer Paternal Grandmother    COPD Maternal Grandmother    Lung cancer Maternal Grandmother      ROS   Physical Exam: BP 123/70 (BP Location: Left Arm)   Pulse 69   Temp 97.9 F (36.6 C) (Oral)   Resp 16   Ht 5\' 3"  (  1.6 m)   Wt 63.5 kg   LMP 01/28/2020 (Approximate)   BMI 24.80 kg/m   OBGyn Exam  Consults: None  Significant Findings/ Diagnostic Studies: none  Procedures: EFM NST Baseline FHR: 145 beats/min Variability: moderate Accelerations: present Decelerations: absent Tocometry: infrequent, mild contractions  Interpretation:  INDICATIONS: rule out uterine contractions RESULTS:  A NST procedure was performed with FHR monitoring and a normal baseline established, appropriate time of 20-40 minutes of evaluation, and accels >2 seen w 15x15 characteristics.  Results show a REACTIVE NST.    Hospital Course: The patient was admitted to Labor and Delivery Triage for observation. She was monitored  to evaluate for regular  contractions. A cervical exam revealed a closed/thick cervix.  Her contractions were rare. With reassurance, she is sent home with plans for f/u  next week at [facility].  A ntoe for work was provided.  Discharge Condition: good  Disposition: Discharge disposition: 01-Home or Self Care       Diet: Regular diet  Discharge Activity: Activity as tolerated  Discharge Instructions     Fetal Kick Count:  Lie on our left side for one hour after a meal, and count the number of times your baby kicks.  If it is less than 5 times, get up, move around and drink some juice.  Repeat the test 30 minutes later.  If it is still less than 5 kicks in an hour, notify your doctor.   Complete by: As directed    LABOR:  When conractions begin, you should start to time them from the beginning of one contraction to the beginning  of the next.  When contractions are 5 - 10 minutes apart or less and have been regular for at least an hour, you should call your health care provider.   Complete by: As directed    Notify physician for bleeding from the vagina   Complete by: As directed    Notify physician for blurring of vision or spots before the eyes   Complete by: As directed    Notify physician for chills or fever   Complete by: As directed    Notify physician for fainting spells, "black outs" or loss of consciousness   Complete by: As directed    Notify physician for increase in vaginal discharge   Complete by: As directed    Notify physician for leaking of fluid   Complete by: As directed    Notify physician for pain or burning when urinating   Complete by: As directed    Notify physician for pelvic pressure (sudden increase)   Complete by: As directed    Notify physician for severe or continued nausea or vomiting   Complete by: As directed    Notify physician for sudden gushing of fluid from the vagina (with or without continued leaking)   Complete by: As directed    Notify physician for sudden,  constant, or occasional abdominal pain   Complete by: As directed    Notify physician if baby moving less than usual   Complete by: As directed       Allergies as of 10/22/2020   No Known Allergies      Medication List     TAKE these medications    pantoprazole 20 MG tablet Commonly known as: Protonix Take 1 tablet (20 mg total) by mouth daily.   Vitafol Gummies 3.33-0.333-34.8 MG Chew Chew 1 capsule by mouth daily.         Total time spent taking  care of this patient: 25 minutes  Signed: Mirna Mires, CNM  10/22/2020, 10:01 AM

## 2020-10-22 NOTE — Progress Notes (Signed)
Provider at bedside at [phone] to assess patient and discuss plan of care. CNM and patient discussed plan for discharge, follow up care, medications to take and labor precautions. Patient verbalized understanding. Note given to patient by CNM regarding work instructions going forward. Patient discharged home in stable condition.

## 2020-10-22 NOTE — Discharge Summary (Signed)
   Please see Final Progress Note.  Mirna Mires, CNM  10/22/2020 10:00 AM

## 2020-10-29 ENCOUNTER — Encounter: Payer: Medicaid Other | Admitting: Obstetrics and Gynecology

## 2020-10-29 ENCOUNTER — Other Ambulatory Visit: Payer: Self-pay

## 2020-10-29 ENCOUNTER — Encounter: Payer: Self-pay | Admitting: Advanced Practice Midwife

## 2020-10-29 ENCOUNTER — Ambulatory Visit (INDEPENDENT_AMBULATORY_CARE_PROVIDER_SITE_OTHER): Payer: Medicaid Other | Admitting: Advanced Practice Midwife

## 2020-10-29 VITALS — BP 120/70 | Wt 144.0 lb

## 2020-10-29 DIAGNOSIS — Z3483 Encounter for supervision of other normal pregnancy, third trimester: Secondary | ICD-10-CM

## 2020-10-29 DIAGNOSIS — Z23 Encounter for immunization: Secondary | ICD-10-CM

## 2020-10-29 DIAGNOSIS — Z3A35 35 weeks gestation of pregnancy: Secondary | ICD-10-CM

## 2020-10-29 DIAGNOSIS — Z369 Encounter for antenatal screening, unspecified: Secondary | ICD-10-CM

## 2020-10-29 DIAGNOSIS — Z3685 Encounter for antenatal screening for Streptococcus B: Secondary | ICD-10-CM

## 2020-10-29 NOTE — Progress Notes (Signed)
Routine Prenatal Care Visit  Subjective  Jessica Mitchell is a 30 y.o. [phone] at [redacted]w[redacted]d being seen today for ongoing prenatal care.  She is currently monitored for the following issues for this low-risk pregnancy and has Depression; Anxiety; History of cesarean delivery; Acute blood loss anemia; History of abnormal cervical Pap smear; Supervision of other normal pregnancy, antepartum; Marijuana user; and Irregular uterine contractions on their problem list.  ----------------------------------------------------------------------------------- Patient reports increase in pelvic pressure- "afraid to poop". She is having irregular contractions. She requests out of work note for the rest of this week and will be out starting next week per FMLA.   Contractions: Irregular. Vag. Bleeding: None.  Movement: Present. Leaking Fluid denies.  ----------------------------------------------------------------------------------- The following portions of the patient's history were reviewed and updated as appropriate: allergies, current medications, past family history, past medical history, past social history, past surgical history and problem list. Problem list updated.  Objective  Blood pressure 120/70, weight 144 lb (65.3 kg), last menstrual period 01/28/2020. Pregravid weight 105 lb (47.6 kg) Total Weight Gain 39 lb (17.7 kg) Urinalysis: Urine Protein    Urine Glucose    Fetal Status: Fetal Heart Rate (bpm): 148 Fundal Height: 35 cm Movement: Present  Presentation: Vertex  General:  Alert, oriented and cooperative. Patient is in no acute distress.  Skin: Skin is warm and dry. No rash noted.   Cardiovascular: Normal heart rate noted  Respiratory: Normal respiratory effort, no problems with respiration noted  Abdomen: Soft, gravid, appropriate for gestational age. Pain/Pressure: Present     Pelvic:  Cervical exam performed Dilation: Fingertip Effacement (%): Thick Station: -2  Extremities: Normal range of  motion.  Edema: None  Mental Status: Normal mood and affect. Normal behavior. Normal judgment and thought content.   Assessment   30 y.o. [id number] at [redacted]w[redacted]d by  12/02/2020, by Ultrasound presenting for routine prenatal visit  Plan   pregnancy Problems (from 04/15/20 to present)    Problem Noted Resolved   Marijuana user 04/25/2020 by Mirna Mires, CNM No   Supervision of other normal pregnancy, antepartum 04/15/2020 by Mirna Mires, CNM No   Overview Addendum 10/29/2020  9:48 AM by Tresea Mall, CNM     [facility] Prenatal Labs  Dating EDD based on ultrasound 12/02/2020 Blood type: O/Negative/-- (03/22 1048)   Genetic Screen NIPS: Normal XX Antibody:Positive, See Final Results (03/22 1048)  Anatomic [place] Completed 6/28 Rubella: 3.54 (03/22 1048)  GTT Third trimester: 96 RPR: Non Reactive (03/22 1048)   Flu vaccine 10/29/20 HBsAg: Negative (03/22 1048)   TDaP vaccine 10/13/20  Rhogam: 10/13/20 HIV: Non Reactive (03/22 1048)   Baby Food          Breast                                     GBS: (For PCN allergy, check sensitivities)  Contraception  Pap:04/15/2020 NILM  Circumcision  Repeat C-section 11/25/20  Pediatrician    Support Person Quinton              GBS collected due to history of early deliveries and in case of precipitous labor/delivery   Preterm labor symptoms and general obstetric precautions including but not limited to vaginal bleeding, contractions, leaking of fluid and fetal movement were reviewed in detail with the patient. Please refer to After Visit Summary for other counseling recommendations.   Return in about 1 week (around  11/05/2020) for rob.  Tresea Mall, CNM 10/29/2020 9:49 AM

## 2020-10-31 LAB — STREP GP B NAA: Strep Gp B NAA: NEGATIVE

## 2020-11-05 ENCOUNTER — Encounter: Payer: Self-pay | Admitting: Obstetrics and Gynecology

## 2020-11-05 ENCOUNTER — Other Ambulatory Visit: Payer: Self-pay

## 2020-11-05 ENCOUNTER — Ambulatory Visit (INDEPENDENT_AMBULATORY_CARE_PROVIDER_SITE_OTHER): Payer: Medicaid Other | Admitting: Obstetrics and Gynecology

## 2020-11-05 VITALS — BP 124/76 | Wt 148.0 lb

## 2020-11-05 DIAGNOSIS — Z3483 Encounter for supervision of other normal pregnancy, third trimester: Secondary | ICD-10-CM

## 2020-11-05 DIAGNOSIS — O26899 Other specified pregnancy related conditions, unspecified trimester: Secondary | ICD-10-CM

## 2020-11-05 DIAGNOSIS — Z6791 Unspecified blood type, Rh negative: Secondary | ICD-10-CM

## 2020-11-05 DIAGNOSIS — Z3A36 36 weeks gestation of pregnancy: Secondary | ICD-10-CM

## 2020-11-05 DIAGNOSIS — Z98891 History of uterine scar from previous surgery: Secondary | ICD-10-CM

## 2020-11-05 NOTE — Progress Notes (Signed)
Routine Prenatal Care Visit  Subjective  Jessica Mitchell is a 30 y.o. [phone] at [redacted]w[redacted]d being seen today for ongoing prenatal care.  She is currently monitored for the following issues for this low-risk pregnancy and has Depression; Anxiety; History of cesarean delivery; Acute blood loss anemia; History of abnormal cervical Pap smear; Supervision of other normal pregnancy, antepartum; Marijuana user; Irregular uterine contractions; and Rh negative state in antepartum period on their problem list.  ----------------------------------------------------------------------------------- Patient reports no complaints.   Contractions: Irregular. Vag. Bleeding: None.  Movement: Present. Leaking Fluid denies.  ----------------------------------------------------------------------------------- The following portions of the patient's history were reviewed and updated as appropriate: allergies, current medications, past family history, past medical history, past social history, past surgical history and problem list. Problem list updated.  Objective  Blood pressure 124/76, weight 148 lb (67.1 kg), last menstrual period 01/28/2020. Pregravid weight 105 lb (47.6 kg) Total Weight Gain 43 lb (19.5 kg) Urinalysis: Urine Protein    Urine Glucose    Fetal Status: Fetal Heart Rate (bpm): 145 Fundal Height: 36 cm Movement: Present     General:  Alert, oriented and cooperative. Patient is in no acute distress.  Skin: Skin is warm and dry. No rash noted.   Cardiovascular: Normal heart rate noted  Respiratory: Normal respiratory effort, no problems with respiration noted  Abdomen: Soft, gravid, appropriate for gestational age. Pain/Pressure: Absent     Pelvic:  Cervical exam performed Dilation: Fingertip Effacement (%): Thick Station: -2  Extremities: Normal range of motion.  Edema: None  Mental Status: Normal mood and affect. Normal behavior. Normal judgment and thought content.   Female chaperone present for  pelvic exam  Assessment   30 y.o. [id number] at [redacted]w[redacted]d by  12/02/2020, by Ultrasound presenting for routine prenatal visit  Plan   pregnancy Problems (from 04/15/20 to present)     Problem Noted Resolved   Rh negative state in antepartum period 11/05/2020 by Conard Novak, MD No   Marijuana user 04/25/2020 by Mirna Mires, CNM No   Supervision of other normal pregnancy, antepartum 04/15/2020 by Mirna Mires, CNM No   Overview Addendum 10/29/2020  9:48 AM by Tresea Mall, CNM     [facility]inic [facility] Prenatal Labs  Dating EDD based on ultrasound 12/02/2020 Blood type: O/Negative/-- (03/22 1048)   Genetic Screen NIPS: Normal XX Antibody:Positive, See Final Results (03/22 1048)  Anatomic [place] Completed 6/28 Rubella: 3.54 (03/22 1048)  GTT Third trimester: 96 RPR: Non Reactive (03/22 1048)   Flu vaccine 10/29/20 HBsAg: Negative (03/22 1048)   TDaP vaccine 10/13/20  Rhogam: 10/13/20 HIV: Non Reactive (03/22 1048)   Baby Food          Breast                                     GBS: (For PCN allergy, check sensitivities)  Contraception  Pap:04/15/2020 NILM  Circumcision  Repeat C-section 11/25/20  Pediatrician    Support Person Quinton                  Preterm labor symptoms and general obstetric precautions including but not limited to vaginal bleeding, contractions, leaking of fluid and fetal movement were reviewed in detail with the patient. Please refer to After Visit Summary for other counseling recommendations.   Return in about 1 week (around 11/12/2020) for [name].   Thomasene Mohair, MD, [facility], [facility] 11/05/2020 3:45 PM

## 2020-11-11 ENCOUNTER — Other Ambulatory Visit: Payer: Self-pay

## 2020-11-11 ENCOUNTER — Inpatient Hospital Stay: Payer: Medicaid Other | Admitting: Anesthesiology

## 2020-11-11 ENCOUNTER — Encounter
Admission: EM | Disposition: A | Discharge status: Home / Self Care | Payer: Self-pay | Source: Home / Self Care | Attending: Obstetrics and Gynecology

## 2020-11-11 ENCOUNTER — Encounter: Payer: Self-pay | Admitting: Obstetrics and Gynecology

## 2020-11-11 ENCOUNTER — Inpatient Hospital Stay
Admission: EM | Admit: 2020-11-11 | Discharge: 2020-11-14 | DRG: 787 | Disposition: A | Discharge status: Home / Self Care | Payer: Medicaid Other | Attending: Obstetrics and Gynecology | Admitting: Obstetrics and Gynecology

## 2020-11-11 DIAGNOSIS — O34211 Maternal care for low transverse scar from previous cesarean delivery: Secondary | ICD-10-CM | POA: Diagnosis present

## 2020-11-11 DIAGNOSIS — O26893 Other specified pregnancy related conditions, third trimester: Secondary | ICD-10-CM | POA: Diagnosis present

## 2020-11-11 DIAGNOSIS — O9081 Anemia of the puerperium: Secondary | ICD-10-CM | POA: Diagnosis not present

## 2020-11-11 DIAGNOSIS — O26899 Other specified pregnancy related conditions, unspecified trimester: Secondary | ICD-10-CM

## 2020-11-11 DIAGNOSIS — O4202 Full-term premature rupture of membranes, onset of labor within 24 hours of rupture: Secondary | ICD-10-CM | POA: Diagnosis present

## 2020-11-11 DIAGNOSIS — O0990 Supervision of high risk pregnancy, unspecified, unspecified trimester: Secondary | ICD-10-CM

## 2020-11-11 DIAGNOSIS — O4290 Premature rupture of membranes, unspecified as to length of time between rupture and onset of labor, unspecified weeks of gestation: Secondary | ICD-10-CM | POA: Diagnosis present

## 2020-11-11 DIAGNOSIS — Z20822 Contact with and (suspected) exposure to covid-19: Secondary | ICD-10-CM | POA: Diagnosis present

## 2020-11-11 DIAGNOSIS — O34219 Maternal care for unspecified type scar from previous cesarean delivery: Secondary | ICD-10-CM

## 2020-11-11 DIAGNOSIS — Z6791 Unspecified blood type, Rh negative: Secondary | ICD-10-CM

## 2020-11-11 DIAGNOSIS — O4292 Full-term premature rupture of membranes, unspecified as to length of time between rupture and onset of labor: Secondary | ICD-10-CM | POA: Diagnosis not present

## 2020-11-11 DIAGNOSIS — Z3A37 37 weeks gestation of pregnancy: Secondary | ICD-10-CM

## 2020-11-11 DIAGNOSIS — Z3480 Encounter for supervision of other normal pregnancy, unspecified trimester: Secondary | ICD-10-CM

## 2020-11-11 DIAGNOSIS — F1290 Cannabis use, unspecified, uncomplicated: Secondary | ICD-10-CM

## 2020-11-11 DIAGNOSIS — D62 Acute posthemorrhagic anemia: Secondary | ICD-10-CM | POA: Diagnosis not present

## 2020-11-11 DIAGNOSIS — Z98891 History of uterine scar from previous surgery: Secondary | ICD-10-CM

## 2020-11-11 LAB — CBC
HCT: 32.1 % — ABNORMAL LOW (ref 36.0–46.0)
Hemoglobin: 10.2 g/dL — ABNORMAL LOW (ref 12.0–15.0)
MCH: 25.4 pg — ABNORMAL LOW (ref 26.0–34.0)
MCHC: 31.8 g/dL (ref 30.0–36.0)
MCV: 79.9 fL — ABNORMAL LOW (ref 80.0–100.0)
Platelets: 143 10*3/uL — ABNORMAL LOW (ref 150–400)
RBC: 4.02 MIL/uL (ref 3.87–5.11)
RDW: 16 % — ABNORMAL HIGH (ref 11.5–15.5)
WBC: 12.7 10*3/uL — ABNORMAL HIGH (ref 4.0–10.5)
nRBC: 0.2 % (ref 0.0–0.2)

## 2020-11-11 LAB — URINE DRUG SCREEN, QUALITATIVE (ARMC ONLY)
Amphetamines, Ur Screen: NOT DETECTED
Barbiturates, Ur Screen: NOT DETECTED
Benzodiazepine, Ur Scrn: NOT DETECTED
Cannabinoid 50 Ng, Ur ~~LOC~~: NOT DETECTED
Cocaine Metabolite,Ur ~~LOC~~: NOT DETECTED
MDMA (Ecstasy)Ur Screen: NOT DETECTED
Methadone Scn, Ur: NOT DETECTED
Opiate, Ur Screen: NOT DETECTED
Phencyclidine (PCP) Ur S: NOT DETECTED
Tricyclic, Ur Screen: NOT DETECTED

## 2020-11-11 LAB — RAPID HIV SCREEN (HIV 1/2 AB+AG)
HIV 1/2 Antibodies: NONREACTIVE
HIV-1 P24 Antigen - HIV24: NONREACTIVE

## 2020-11-11 LAB — RESP PANEL BY RT-PCR (FLU A&B, COVID) ARPGX2
Influenza A by PCR: NEGATIVE
Influenza B by PCR: NEGATIVE
SARS Coronavirus 2 by RT PCR: NEGATIVE

## 2020-11-11 LAB — TYPE AND SCREEN
ABO/RH(D): O NEG
Antibody Screen: POSITIVE

## 2020-11-11 LAB — RPR: RPR Ser Ql: NONREACTIVE

## 2020-11-11 SURGERY — Surgical Case
Anesthesia: Spinal

## 2020-11-11 MED ORDER — OXYCODONE HCL 5 MG PO TABS: 10.0000 mg | ORAL_TABLET | ORAL | Status: AC | PRN

## 2020-11-11 MED ORDER — SENNOSIDES-DOCUSATE SODIUM 8.6-50 MG PO TABS
2.0000 | ORAL_TABLET | ORAL | Status: DC
  Administered 2020-11-11 – 2020-11-14 (×4): 2 via ORAL
  Filled 2020-11-11 (×5): qty 2

## 2020-11-11 MED ORDER — BUPIVACAINE IN DEXTROSE 0.75-8.25 % IT SOLN
INTRATHECAL | Status: DC | PRN
  Administered 2020-11-11: 1.5 mL via INTRATHECAL

## 2020-11-11 MED ORDER — NALBUPHINE HCL 10 MG/ML IJ SOLN
5.0000 mg | Freq: Once | INTRAMUSCULAR | Status: AC | PRN
  Administered 2020-11-11: 5 mg via INTRAVENOUS

## 2020-11-11 MED ORDER — MEPERIDINE HCL 25 MG/ML IJ SOLN: 6.2500 mg | INTRAMUSCULAR | Status: DC | PRN

## 2020-11-11 MED ORDER — OXYTOCIN-SODIUM CHLORIDE 30-0.9 UT/500ML-% IV SOLN
2.5000 [IU]/h | INTRAVENOUS | Status: DC
  Administered 2020-11-11: 2.5 [IU]/h via INTRAVENOUS

## 2020-11-11 MED ORDER — NALBUPHINE HCL 10 MG/ML IJ SOLN
5.0000 mg | INTRAMUSCULAR | Status: DC | PRN
  Administered 2020-11-11: 5 mg via INTRAVENOUS
  Filled 2020-11-11 (×2): qty 1

## 2020-11-11 MED ORDER — PHENYLEPHRINE HCL (PRESSORS) 10 MG/ML IV SOLN
INTRAVENOUS | Status: AC
  Filled 2020-11-11: qty 1

## 2020-11-11 MED ORDER — BUPIVACAINE 0.25 % ON-Q PUMP DUAL CATH 400 ML
400.0000 mL | INJECTION | Status: DC
  Filled 2020-11-11: qty 400

## 2020-11-11 MED ORDER — MENTHOL 3 MG MT LOZG
1.0000 | LOZENGE | OROMUCOSAL | Status: DC | PRN
  Filled 2020-11-11: qty 9

## 2020-11-11 MED ORDER — DEXAMETHASONE SODIUM PHOSPHATE 10 MG/ML IJ SOLN
INTRAMUSCULAR | Status: AC
  Filled 2020-11-11: qty 1

## 2020-11-11 MED ORDER — MORPHINE SULFATE (PF) 0.5 MG/ML IJ SOLN
INTRAMUSCULAR | Status: AC
  Filled 2020-11-11: qty 10

## 2020-11-11 MED ORDER — WITCH HAZEL-GLYCERIN EX PADS: 1.0000 "application " | MEDICATED_PAD | CUTANEOUS | Status: DC | PRN

## 2020-11-11 MED ORDER — OXYCODONE-ACETAMINOPHEN 5-325 MG PO TABS
1.0000 | ORAL_TABLET | ORAL | Status: DC | PRN
Start: 2020-11-12 — End: 2020-11-14
  Administered 2020-11-13 – 2020-11-14 (×3): 1 via ORAL
  Filled 2020-11-11 (×5): qty 1

## 2020-11-11 MED ORDER — OXYTOCIN-SODIUM CHLORIDE 30-0.9 UT/500ML-% IV SOLN
INTRAVENOUS | Status: DC | PRN
  Administered 2020-11-11: 30 [IU] via INTRAVENOUS

## 2020-11-11 MED ORDER — DIBUCAINE (PERIANAL) 1 % EX OINT: 1.0000 "application " | TOPICAL_OINTMENT | CUTANEOUS | Status: DC | PRN

## 2020-11-11 MED ORDER — IBUPROFEN 600 MG PO TABS
600.0000 mg | ORAL_TABLET | Freq: Four times a day (QID) | ORAL | Status: DC
  Administered 2020-11-12 – 2020-11-13 (×3): 600 mg via ORAL
  Filled 2020-11-11 (×4): qty 1

## 2020-11-11 MED ORDER — LIDOCAINE HCL (PF) 1 % IJ SOLN: 30.0000 mL | INTRAMUSCULAR | Status: DC | PRN

## 2020-11-11 MED ORDER — LACTATED RINGERS IV SOLN: INTRAVENOUS | Status: DC

## 2020-11-11 MED ORDER — ONDANSETRON HCL 4 MG/2ML IJ SOLN
4.0000 mg | Freq: Four times a day (QID) | INTRAMUSCULAR | Status: DC | PRN
  Administered 2020-11-11: 4 mg via INTRAVENOUS
  Filled 2020-11-11: qty 2

## 2020-11-11 MED ORDER — MISOPROSTOL 200 MCG PO TABS
ORAL_TABLET | ORAL | Status: AC
  Filled 2020-11-11: qty 4

## 2020-11-11 MED ORDER — BUPIVACAINE HCL (PF) 0.5 % IJ SOLN
INTRAMUSCULAR | Status: AC
  Filled 2020-11-11: qty 30

## 2020-11-11 MED ORDER — NALOXONE HCL 4 MG/10ML IJ SOLN
1.0000 ug/kg/h | INTRAVENOUS | Status: DC | PRN
  Filled 2020-11-11: qty 5

## 2020-11-11 MED ORDER — BUPIVACAINE HCL (PF) 0.5 % IJ SOLN: 5.0000 mL | Freq: Once | INTRAMUSCULAR | Status: DC

## 2020-11-11 MED ORDER — SIMETHICONE 80 MG PO CHEW
80.0000 mg | CHEWABLE_TABLET | Freq: Three times a day (TID) | ORAL | Status: DC
  Administered 2020-11-11 – 2020-11-14 (×8): 80 mg via ORAL
  Filled 2020-11-11 (×8): qty 1

## 2020-11-11 MED ORDER — BUPIVACAINE HCL (PF) 0.5 % IJ SOLN
5.0000 mL | Freq: Once | INTRAMUSCULAR | Status: DC
  Filled 2020-11-11: qty 10

## 2020-11-11 MED ORDER — PRENATAL MULTIVITAMIN CH
1.0000 | ORAL_TABLET | Freq: Every day | ORAL | Status: DC
  Administered 2020-11-11 – 2020-11-14 (×4): 1 via ORAL
  Filled 2020-11-11 (×4): qty 1

## 2020-11-11 MED ORDER — KETOROLAC TROMETHAMINE 30 MG/ML IJ SOLN
INTRAMUSCULAR | Status: AC
  Filled 2020-11-11: qty 1

## 2020-11-11 MED ORDER — OXYCODONE-ACETAMINOPHEN 5-325 MG PO TABS
2.0000 | ORAL_TABLET | ORAL | Status: DC | PRN
Start: 2020-11-12 — End: 2020-11-14
  Administered 2020-11-12 – 2020-11-14 (×4): 2 via ORAL
  Filled 2020-11-11 (×4): qty 2

## 2020-11-11 MED ORDER — NALOXONE HCL 0.4 MG/ML IJ SOLN: 0.4000 mg | INTRAMUSCULAR | Status: DC | PRN

## 2020-11-11 MED ORDER — SODIUM CHLORIDE 0.9 % IV SOLN
INTRAVENOUS | Status: DC | PRN
  Administered 2020-11-11: 50 ug/min via INTRAVENOUS

## 2020-11-11 MED ORDER — FENTANYL CITRATE (PF) 100 MCG/2ML IJ SOLN
INTRAMUSCULAR | Status: AC
  Filled 2020-11-11: qty 2

## 2020-11-11 MED ORDER — FENTANYL CITRATE (PF) 100 MCG/2ML IJ SOLN
INTRAMUSCULAR | Status: DC | PRN
  Administered 2020-11-11: 15 ug via INTRATHECAL

## 2020-11-11 MED ORDER — FERROUS SULFATE 325 (65 FE) MG PO TABS
325.0000 mg | ORAL_TABLET | Freq: Two times a day (BID) | ORAL | Status: DC
  Administered 2020-11-11 – 2020-11-14 (×6): 325 mg via ORAL
  Filled 2020-11-11 (×6): qty 1

## 2020-11-11 MED ORDER — KETOROLAC TROMETHAMINE 30 MG/ML IJ SOLN
INTRAMUSCULAR | Status: DC | PRN
  Administered 2020-11-11: 30 mg via INTRAVENOUS

## 2020-11-11 MED ORDER — AMMONIA AROMATIC IN INHA
RESPIRATORY_TRACT | Status: AC
  Filled 2020-11-11: qty 10

## 2020-11-11 MED ORDER — ONDANSETRON HCL 4 MG/2ML IJ SOLN: 4.0000 mg | Freq: Three times a day (TID) | INTRAMUSCULAR | Status: DC | PRN

## 2020-11-11 MED ORDER — CEFAZOLIN SODIUM-DEXTROSE 2-4 GM/100ML-% IV SOLN
2.0000 g | INTRAVENOUS | Status: AC
  Administered 2020-11-11: 2 g via INTRAVENOUS
  Filled 2020-11-11: qty 100

## 2020-11-11 MED ORDER — KETOROLAC TROMETHAMINE 30 MG/ML IJ SOLN: 30.0000 mg | Freq: Four times a day (QID) | INTRAMUSCULAR | Status: AC

## 2020-11-11 MED ORDER — ONDANSETRON HCL 4 MG/2ML IJ SOLN
INTRAMUSCULAR | Status: DC | PRN
  Administered 2020-11-11: 4 mg via INTRAVENOUS

## 2020-11-11 MED ORDER — SCOPOLAMINE 1 MG/3DAYS TD PT72: 1.0000 | MEDICATED_PATCH | Freq: Once | TRANSDERMAL | Status: DC

## 2020-11-11 MED ORDER — OXYTOCIN BOLUS FROM INFUSION: 333.0000 mL | Freq: Once | INTRAVENOUS | Status: DC

## 2020-11-11 MED ORDER — COCONUT OIL OIL
1.0000 "application " | TOPICAL_OIL | Status: DC | PRN
  Administered 2020-11-14: 1 via TOPICAL
  Filled 2020-11-11: qty 120

## 2020-11-11 MED ORDER — DIPHENHYDRAMINE HCL 25 MG PO CAPS
25.0000 mg | ORAL_CAPSULE | Freq: Four times a day (QID) | ORAL | Status: DC | PRN
  Administered 2020-11-12: 25 mg via ORAL
  Filled 2020-11-11: qty 1

## 2020-11-11 MED ORDER — LACTATED RINGERS IV SOLN: 500.0000 mL | INTRAVENOUS | Status: DC | PRN

## 2020-11-11 MED ORDER — OXYTOCIN-SODIUM CHLORIDE 30-0.9 UT/500ML-% IV SOLN: 2.5000 [IU]/h | INTRAVENOUS | Status: AC

## 2020-11-11 MED ORDER — ONDANSETRON HCL 4 MG/2ML IJ SOLN
INTRAMUSCULAR | Status: AC
  Filled 2020-11-11: qty 2

## 2020-11-11 MED ORDER — SOD CITRATE-CITRIC ACID 500-334 MG/5ML PO SOLN: 30.0000 mL | ORAL | Status: AC

## 2020-11-11 MED ORDER — BUPIVACAINE HCL (PF) 0.5 % IJ SOLN
INTRAMUSCULAR | Status: DC | PRN
  Administered 2020-11-11: 10 mL

## 2020-11-11 MED ORDER — OXYTOCIN-SODIUM CHLORIDE 30-0.9 UT/500ML-% IV SOLN
INTRAVENOUS | Status: AC
  Filled 2020-11-11: qty 500

## 2020-11-11 MED ORDER — SOD CITRATE-CITRIC ACID 500-334 MG/5ML PO SOLN
ORAL | Status: AC
  Administered 2020-11-11: 30 mL via ORAL
  Filled 2020-11-11: qty 15

## 2020-11-11 MED ORDER — SODIUM CHLORIDE 0.9% FLUSH: 3.0000 mL | INTRAVENOUS | Status: DC | PRN

## 2020-11-11 MED ORDER — SOD CITRATE-CITRIC ACID 500-334 MG/5ML PO SOLN: 30.0000 mL | ORAL | Status: DC | PRN

## 2020-11-11 MED ORDER — OXYTOCIN 10 UNIT/ML IJ SOLN
INTRAMUSCULAR | Status: AC
  Filled 2020-11-11: qty 2

## 2020-11-11 MED ORDER — KETOROLAC TROMETHAMINE 30 MG/ML IJ SOLN
30.0000 mg | Freq: Four times a day (QID) | INTRAMUSCULAR | Status: AC
  Administered 2020-11-11 – 2020-11-12 (×4): 30 mg via INTRAVENOUS
  Filled 2020-11-11 (×4): qty 1

## 2020-11-11 MED ORDER — NALBUPHINE HCL 10 MG/ML IJ SOLN: 5.0000 mg | Freq: Once | INTRAMUSCULAR | Status: AC | PRN

## 2020-11-11 MED ORDER — ACETAMINOPHEN 500 MG PO TABS
1000.0000 mg | ORAL_TABLET | Freq: Four times a day (QID) | ORAL | Status: AC
  Administered 2020-11-11 – 2020-11-12 (×3): 1000 mg via ORAL
  Filled 2020-11-11 (×4): qty 2

## 2020-11-11 MED ORDER — LIDOCAINE HCL (PF) 1 % IJ SOLN
INTRAMUSCULAR | Status: AC
  Filled 2020-11-11: qty 30

## 2020-11-11 MED ORDER — NALBUPHINE HCL 10 MG/ML IJ SOLN: 5.0000 mg | INTRAMUSCULAR | Status: DC | PRN

## 2020-11-11 MED ORDER — MORPHINE SULFATE (PF) 0.5 MG/ML IJ SOLN
INTRAMUSCULAR | Status: DC | PRN
  Administered 2020-11-11: .1 mg via INTRATHECAL

## 2020-11-11 MED ORDER — OXYCODONE HCL 5 MG PO TABS
5.0000 mg | ORAL_TABLET | ORAL | Status: AC | PRN
  Administered 2020-11-12: 5 mg via ORAL
  Filled 2020-11-11: qty 1

## 2020-11-11 SURGICAL SUPPLY — 35 items
CATH KIT ON-Q SILVERSOAK 5IN (CATHETERS) ×4 IMPLANT
DERMABOND ADHESIVE PROPEN (GAUZE/BANDAGES/DRESSINGS) ×1
DERMABOND ADVANCED (GAUZE/BANDAGES/DRESSINGS) ×1
DERMABOND ADVANCED .7 DNX12 (GAUZE/BANDAGES/DRESSINGS) ×1 IMPLANT
DERMABOND ADVANCED .7 DNX6 (GAUZE/BANDAGES/DRESSINGS) ×1 IMPLANT
DRESSING SURGICEL FIBRLLR 1X2 (HEMOSTASIS) ×1 IMPLANT
DRSG OPSITE POSTOP 4X10 (GAUZE/BANDAGES/DRESSINGS) ×2 IMPLANT
DRSG SURGICEL FIBRILLAR 1X2 (HEMOSTASIS) ×2
DRSG TELFA 3X8 NADH (GAUZE/BANDAGES/DRESSINGS) ×2 IMPLANT
ELECT CAUTERY BLADE 6.4 (BLADE) ×2 IMPLANT
ELECT REM PT RETURN 9FT ADLT (ELECTROSURGICAL) ×2
ELECTRODE REM PT RTRN 9FT ADLT (ELECTROSURGICAL) ×1 IMPLANT
GAUZE SPONGE 4X4 12PLY STRL (GAUZE/BANDAGES/DRESSINGS) ×2 IMPLANT
GLOVE SURG ENC MOIS LTX SZ7 (GLOVE) ×2 IMPLANT
GLOVE SURG UNDER LTX SZ7.5 (GLOVE) ×2 IMPLANT
GOWN STRL REUS W/ TWL LRG LVL3 (GOWN DISPOSABLE) ×3 IMPLANT
GOWN STRL REUS W/TWL LRG LVL3 (GOWN DISPOSABLE) ×3
MANIFOLD NEPTUNE II (INSTRUMENTS) ×2 IMPLANT
MAT PREVALON FULL STRYKER (MISCELLANEOUS) ×2 IMPLANT
NS IRRIG 1000ML POUR BTL (IV SOLUTION) ×2 IMPLANT
PACK C SECTION AR (MISCELLANEOUS) ×2 IMPLANT
PAD OB MATERNITY 4.3X12.25 (PERSONAL CARE ITEMS) ×4 IMPLANT
PAD PREP 24X41 OB/GYN DISP (PERSONAL CARE ITEMS) ×2 IMPLANT
PENCIL SMOKE EVACUATOR (MISCELLANEOUS) ×2 IMPLANT
SCRUB EXIDINE 4% CHG 4OZ (MISCELLANEOUS) ×2 IMPLANT
STRIP CLOSURE SKIN 1/2X4 (GAUZE/BANDAGES/DRESSINGS) ×2 IMPLANT
SUT MNCRL 4-0 (SUTURE) ×1
SUT MNCRL 4-0 27XMFL (SUTURE) ×1
SUT PDS AB 1 TP1 96 (SUTURE) ×2 IMPLANT
SUT PLAIN GUT 0 (SUTURE) IMPLANT
SUT VIC AB 0 CTX 36 (SUTURE) ×3
SUT VIC AB 0 CTX36XBRD ANBCTRL (SUTURE) ×3 IMPLANT
SUTURE MNCRL 4-0 27XMF (SUTURE) ×1 IMPLANT
SWABSTK COMLB BENZOIN TINCTURE (MISCELLANEOUS) ×2 IMPLANT
WATER STERILE IRR 500ML POUR (IV SOLUTION) ×2 IMPLANT

## 2020-11-11 NOTE — H&P (Addendum)
Jessica Mitchell is a 30 y.o. female presenting with report of a sudden "gush" of fluid at 0500 this morning. She arrived to the hospital with ongoing leaking of fluid. Her OB history is noted for two previous Cesarean Sections. She was scheduled for a repeat CS at 39 weeks. Today she is [redacted] weeks GA. She reports that her baby is moving well, and she is feeling only mild cramping. Her husband is present and supportive.  She last ate at 2300 yesterday. OB History     Gravida  3   Para  2   Term  1   Preterm  1   AB  0   Living  3      SAB  0   IAB  0   Ectopic  0   Multiple  1   Live Births  3          Past Medical History:  Diagnosis Date   Anxiety    Depression    depression and anxiety   Fatigue    Mucous in stools    Unintentional weight loss    Vaginal Pap smear, abnormal    Past Surgical History:  Procedure Laterality Date   CESAREAN SECTION     CESAREAN SECTION N/A 04/02/2018   Procedure: CESAREAN SECTION;  Surgeon: Conard Novak, MD;  Location: [facility] ORS;  Service: Obstetrics;  Laterality: N/A;   FINGER SURGERY Right    2/2 trauma   Family History: family history includes COPD in her maternal grandmother; Diabetes in her maternal grandfather; Hypertension in her maternal grandfather; Lung cancer in her maternal grandmother and paternal grandmother; Stroke in her maternal grandfather. Social History:  reports that she has never smoked. She has never used smokeless tobacco. She reports that she does not currently use alcohol. She reports that she does not currently use drugs after having used the following drugs: Marijuana.     Maternal Diabetes: No Genetic Screening: Normal Maternal Ultrasounds/Referrals: Normal Fetal Ultrasounds or other Referrals:  None Maternal Substance Abuse:  No Significant Maternal Medications:  None Significant Maternal Lab Results:  Group B Strep negative, Rh negative, and Other:  Other Comments:   a weak Antibody D noted  on her antibody screen.  Review of Systems  Constitutional: Negative.   HENT: Negative.    Eyes: Negative.   Respiratory: Negative.    Cardiovascular: Negative.   Gastrointestinal: Negative.        Gravid abdomen.  Endocrine: Negative.   Genitourinary: Negative.   Musculoskeletal: Negative.   Allergic/Immunologic: Negative.   Neurological: Negative.   Hematological: Negative.   Psychiatric/Behavioral: Negative.    History Dilation: 2 Effacement (%): 90 Station: -2 Exam by:: Paula Compton CNM Temperature 98.2 F (36.8 C), temperature source Oral, last menstrual period 01/28/2020. Maternal Exam:  Uterine Assessment: Contraction strength is mild.  Contraction duration is 1 minute. Contraction frequency is regular.  Abdomen: Patient reports no abdominal tenderness. Surgical scars: low transverse.   Fetal presentation: vertex Introitus: Normal vulva. Normal vagina.  Amniotic fluid character: clear. Cervix: Cervix evaluated by digital exam.    Physical Exam Constitutional:      Appearance: Normal appearance.  HENT:     Head: Normocephalic and atraumatic.     Nose: Nose normal.  Eyes:     Extraocular Movements: Extraocular movements intact.     Pupils: Pupils are equal, round, and reactive to light.  Cardiovascular:     Rate and Rhythm: Normal rate and regular rhythm.  Pulses: Normal pulses.     Heart sounds: Normal heart sounds.  Pulmonary:     Effort: Pulmonary effort is normal.     Breath sounds: Normal breath sounds.  Abdominal:     General: Bowel sounds are normal.     Comments: Gravid abdomen  Genitourinary:    General: Normal vulva.     Comments: Leaking moderate amount of clear fluid. SVE: 2 cms/90%/-2. Cervix is quite posterior Musculoskeletal:        General: Normal range of motion.     Cervical back: Normal range of motion and neck supple.  Skin:    General: Skin is warm and dry.  Neurological:     General: No focal deficit present.     Mental  Status: She is alert and oriented to person, place, and time.  Psychiatric:        Mood and Affect: Mood normal.        Behavior: Behavior normal.    Prenatal labs: ABO, Rh: O/Negative/-- (03/22 1048) Antibody: Negative (07/26 1010) Rubella: 3.54 (03/22 1048) RPR: Non Reactive (07/26 1010)  HBsAg: Negative (03/22 1048)  HIV: Non Reactive (07/26 1010)  GBS: Negative/-- (09/07 0957)   SVE: 2 cms/90%/-2. Posterior cervix.  Assessment/Plan: IUP 37 weeks History of two previous Cesarean sections, previously scheduled for repeat  at [redacted] weeks gestation. Grossly ruptured Reassuring, reactive fetal heart tones  Plan: routine orders for admission, pre op CS orders placed. Rapid COVID testing. IV access Dr. Jean Rosenthal notified. Anesthesia notified  and OR also informed. Will proceed with repeat CS once labs results complete and COVID test results available.  Mirna Mires, CNM  11/11/2020 6:26 AM   Patient presents with SROM this morning of clear fluid. She had been contracting overnight.  She had noticed decreased fetal movement at one point, but noticed more right before SROM.  She denies vaginal bleeding. She has a history of 2 c-sections and wishes to have another.  She is Rh negative.  Will proceed with repeat c-section as soon as everything is ready.  Consents reviewed and signed.  Thomasene Mohair, MD, [facility], [facility] 11/11/2020 7:23 AM

## 2020-11-11 NOTE — Anesthesia Preprocedure Evaluation (Signed)
Anesthesia Evaluation  Patient identified by MRN, date of birth, ID band Patient awake    Reviewed: Allergy & Precautions, H&P , NPO status , Patient's Chart, lab work & pertinent test results, reviewed documented beta blocker date and time   Airway Mallampati: II  TM Distance: >3 FB Neck ROM: full    Dental no notable dental hx. (+) Teeth Intact   Pulmonary neg pulmonary ROS, Current Smoker and Patient abstained from smoking.,    Pulmonary exam normal breath sounds clear to auscultation       Cardiovascular Exercise Tolerance: Good negative cardio ROS   Rhythm:regular Rate:Normal     Neuro/Psych PSYCHIATRIC DISORDERS Anxiety Depression negative neurological ROS     GI/Hepatic negative GI ROS, Neg liver ROS,   Endo/Other  negative endocrine ROSGestational  Renal/GU      Musculoskeletal   Abdominal   Peds  Hematology negative hematology ROS (+)   Anesthesia Other Findings History of cesarean delivery x3. Here with placental abruption.  Reproductive/Obstetrics (+) Pregnancy                             Anesthesia Physical  Anesthesia Plan  ASA: II and emergent  Anesthesia Plan: Spinal   Post-op Pain Management:    Induction:   PONV Risk Score and Plan:   Airway Management Planned:   Additional Equipment:   Intra-op Plan:   Post-operative Plan:   Informed Consent: I have reviewed the patients History and Physical, chart, labs and discussed the procedure including the risks, benefits and alternatives for the proposed anesthesia with the patient or authorized representative who has indicated his/her understanding and acceptance.     Dental advisory given  Plan Discussed with: CRNA  Anesthesia Plan Comments:         Anesthesia Quick Evaluation

## 2020-11-11 NOTE — Op Note (Signed)
Cesarean Section Operative Note    Patient Name: Jessica Mitchell  MRN: [id number]  Date of Surgery: 11/11/2020   Pre-operative Diagnosis:  1) History of cesarean section, desires repeat 2) Spontaneous rupture of membranes with latent labor 3) intrauterine pregnancy at [redacted]w[redacted]d   Post-operative Diagnosis:  1) History of cesarean section, desires repeat 2) Spontaneous rupture of membranes with latent labor 3) intrauterine pregnancy at [redacted]w[redacted]d    Procedure: Repeat Low Transverse Cesarean Section via Pfannenstiel incision with double-layer uterine closure.   Surgeon: Moishe Spice) and Role:    * Conard Novak, MD - Primary  Assistants: Dr. Lebron Conners; No other capable assistant available, in surgery requiring high level assistant.  Anesthesia: spinal   Findings:  1) normal appearing gravid uterus, fallopian tubes, and ovaries 2) viable female infant with APGARs of 8 and 9, weight of 2,790 grams (6 lb 2.4 oz)   Quantified Blood Loss: 625 mL  Total IV Fluids: 1,300 ml   Urine Output:  75 mL clear urine at end of case  Specimens: none  Complications: no complications  Disposition: PACU - hemodynamically stable.   Maternal Condition: stable   Baby condition / location:  Couplet care / Skin to Skin  Procedure Details:  The patient was seen in the Holding Room. The risks, benefits, complications, treatment options, and expected outcomes were discussed with the patient. The patient concurred with the proposed plan, giving informed consent. identified as Jessica Mitchell and the procedure verified as C-Section Delivery. A Time Out was held and the above information confirmed.   After induction of anesthesia, the patient was draped and prepped in the usual sterile manner. A Pfannenstiel incision was made and carried down through the subcutaneous tissue to the fascia. Fascial incision was made and extended transversely. The fascia was separated from the underlying rectus tissue  superiorly and inferiorly. The peritoneum was identified and entered. Peritoneal incision was extended longitudinally. The bladder flap was bluntly and sharply freed from the lower uterine segment. A low transverse uterine incision was made and the hysterotomy was extended with cranial-caudal tension. Delivered from cephalic presentation was a 2,790 gram Living newborn infant(s) or Female with Apgar scores of 8 at one minute and 9 at five minutes. Cord ph was not sent the umbilical cord was clamped and cut cord blood was obtained for evaluation. The placenta was removed Intact and appeared normal. The uterine outline, tubes and ovaries appeared normal. The uterine incision was closed with running locked sutures of 0 Vicryl.  A second layer of the same suture was thrown in an imbricating fashion.  A run of 0 vicryl was made to obtain hemostasis over the right aspect of the uterine closure.  Hemostasis was assured and Fibrillar was placed to ensure ongoing hemostasis.  The uterus was returned to the abdomen and the paracolic gutters were cleared of all clots and debris.  The rectus muscles were inspected and found to be hemostatic.  The On-Q catheter pumps were inserted in accordance with the manufacturer's recommendations.  The catheters were inserted approximately 4cm cephelad to the incision line, approximately 1cm apart, straddling the midline.  They were inserted to a depth of the 4th mark. They were positioned superficial to the rectus abdominus muscles and deep to the rectus fascia.    The fascia was then reapproximated with running sutures of 1-0 PDS, looped. The subcuticular closure was performed using 4-0 monocryl. The skin closure was reinforced using surgical skin glue.  The On-Q catheters were bolused  with 5 mL of 0.5% marcaine plain for a total of 10 mL.  The catheters were affixed to the skin with surgical skin glue, steri-strips, and tegaderm.    The surgical assistant performed tissue  retraction, assistance with suturing, and fundal pressure.    Instrument, sponge, and needle counts were correct prior the abdominal closure and were correct at the conclusion of the case.  The patient received Ancef 2 gram IV prior to skin incision (within 30 minutes). For VTE prophylaxis she was wearing SCDs throughout the case.  The assistant surgeon was an MD due to lack of availability of another Sales promotion account executive.    Signed: Conard Novak, MD 11/11/2020 9:55 AM

## 2020-11-11 NOTE — Lactation Note (Signed)
This note was copied from a baby's chart. Lactation Consultation Note  Patient Name: Jessica Mitchell Date: 11/11/2020 Reason for consult: Initial assessment;1st time breastfeeding;Early term 37-38.6wks;Other (Comment) (repeat c-section) Age:32 hours  Initial lactation visit. Mom is P4, repeat c-section. Mom desires BF, she has no previous breastfeeding experience.  Baby has been to the breast 2x before coming to [facility], and now mom is working with feeding when [facility] entered the room.  LC provided suggestion of football hold, mom open to trying. Support pillows added, mom's bed readjusted, LC brought baby to breast. Baby grasped the breast easily, had a strong rhythmic sucking pattern with identifiable swallows.  While feeding, LC provided breastfeeding basic education: feeding patterns/behaviors, feeding w/ cues and not the clock, and output expectations. Provided guidance on signs that baby is content and getting enough at the breast, and when baby is finished eating.   Mom had questions re: pumping. She has a 32yr old at home and feels she may be better at pumping than at the breast. LC educated parents on baby's ability to bring in an adequate milk supply, and regulate that supply for her needs. Encouraged feeding at the breast for at least the first 2 weeks with slow introduction to pumping and bottles after that. Encouraged continued at the breast feedings during the night, discussed cluster feeding during early morning hours and benefits in on demand feedings to build supply. Parents verbalized understanding.  Whiteboard updated, encouraged to call with questions/concerns and for ongoing BF support.  Maternal Data Has patient been taught Hand Expression?: Yes Does the patient have breastfeeding experience prior to this delivery?: No (did not BF her other children)  Feeding Mother's Current Feeding Choice: Breast Milk  LATCH Score Latch: Grasps breast easily, tongue down, lips  flanged, rhythmical sucking.  Audible Swallowing: A few with stimulation  Type of Nipple: Everted at rest and after stimulation  Comfort (Breast/Nipple): Soft / non-tender  Hold (Positioning): Assistance needed to correctly position infant at breast and maintain latch.  LATCH Score: 8   Lactation Tools Discussed/Used    Interventions Interventions: Breast feeding basics reviewed;Assisted with latch;Hand express;Position options;Support pillows;Education  Discharge    Consult Status Consult Status: Follow-up Date: 11/11/20 Follow-up type: In-patient    Danford Bad 11/11/2020, 2:15 PM

## 2020-11-11 NOTE — Transfer of Care (Signed)
Immediate Anesthesia Transfer of Care Note  Patient: Jessica Mitchell  Procedure(s) Performed: CESAREAN SECTION  Patient Location: PACU  Anesthesia Type:Spinal  Level of Consciousness: awake, alert  and oriented  Airway & Oxygen Therapy: Patient Spontanous Breathing  Post-op Assessment: Report given to RN and Post -op Vital signs reviewed and stable  Post vital signs: Reviewed and stable  Last Vitals:  Vitals Value Taken Time  BP 99/64 11/11/20 1012  Temp 37 C 11/11/20 1012  Pulse 52 11/11/20 1012  Resp 16 11/11/20 1012  SpO2 97 % 11/11/20 1012  Vitals shown include unvalidated device data.  Last Pain:  Vitals:   11/11/20 1012  TempSrc:   PainSc: 0-No pain      Patients Stated Pain Goal: 0 (11/11/20 0616)  Complications: No notable events documented.

## 2020-11-11 NOTE — Anesthesia Procedure Notes (Signed)
Spinal  Patient location during procedure: OR Start time: 11/11/2020 8:30 AM End time: 11/11/2020 8:33 AM Reason for block: surgical anesthesia Staffing Performed: resident/CRNA  Anesthesiologist: Arita Miss, MD Resident/CRNA: Hedda Slade, CRNA Preanesthetic Checklist Completed: patient identified, IV checked, site marked, risks and benefits discussed, surgical consent, monitors and equipment checked, pre-op evaluation and timeout performed Spinal Block Patient position: sitting Prep: ChloraPrep Patient monitoring: heart rate, continuous pulse ox, blood pressure and cardiac monitor Approach: midline Location: L3-4 Injection technique: single-shot Needle Needle type: Whitacre and Introducer  Needle gauge: 24 G Needle length: 9 cm Assessment Sensory level: T4 Events: CSF return Additional Notes Negative paresthesia. Negative blood return. Positive free-flowing CSF. Expiration date of kit checked and confirmed. Patient tolerated procedure well, without complications.

## 2020-11-11 NOTE — Discharge Summary (Signed)
Postpartum Discharge Summary  Date of Service updated09/22/2022     Patient Name: Jessica Mitchell DOB: 1991-01-09 MRN: [id number]  Date of admission: 11/11/2020 Delivery date:11/11/2020  Delivering provider: Prentice Docker D  Date of discharge: 11/13/2020  Admitting diagnosis: History of cesarean delivery [Z98.891], SROM Intrauterine pregnancy: [redacted]w[redacted]d    Secondary diagnosis:  Active Problems:   History of cesarean delivery   Supervision of other normal pregnancy, antepartum   Rh negative state in antepartum period   Delayed delivery after SROM (spontaneous rupture of membranes)   [redacted] weeks gestation of pregnancy  Additional problems: none    Discharge diagnosis: Term Pregnancy Delivered                                              Post partum procedures:rhogam Augmentation: N/A Complications: None  Hospital course: The patient presented with SROM and latent labor. Due to her history of 2 cesarean deliveries and desire for repeat cesarean, she was taken to the operating room where she underwent a repeat cesarean section, which occurred without difficulty.  Her post op course was uneventful, and she is discharged home on POD #2 [facility]  Magnesium Sulfate received: No BMZ received: No Rhophylac:Yes MMR:No T-DaP:Given prenatally on 10/13/20 Flu: received on 10/29/2020 Transfusion:No  Physical exam  Vitals:   11/12/20 1155 11/12/20 1535 11/12/20 2310 11/13/20 0756  BP: 119/69 125/76 127/72 111/62  Pulse: 62 65 60 63  Resp: '19 17 20 20  ' Temp: 98.3 F (36.8 C) 98 F (36.7 C) 98.2 F (36.8 C) 98.4 F (36.9 C)  TempSrc: Oral Oral Oral Oral  SpO2: 99% 99% 100% 98%  Weight:      Height:       General: alert, cooperative, and no distress Lochia: appropriate Uterine Fundus: firm Incision: Healing well with no significant drainage, No significant erythema, Dressing is clean, dry, and intact DVT Evaluation: No evidence of  DVT seen on physical exam. Negative Homan's sign. Labs: Lab Results  Component Value Date   WBC 23.1 (H) 11/12/2020   HGB 8.1 (L) 11/12/2020   HCT 25.7 (L) 11/12/2020   MCV 80.6 11/12/2020   PLT 141 (L) 11/12/2020   CMP Latest Ref Rng & Units 04/11/2020  Glucose 70 - 99 mg/dL 98  BUN 6 - 20 mg/dL 6  Creatinine 0.44 - 1.00 mg/dL 0.54  Sodium 135 - 145 mmol/L 134(L)  Potassium 3.5 - 5.1 mmol/L 3.5  Chloride 98 - 111 mmol/L 107  CO2 22 - 32 mmol/L 19(L)  Calcium 8.9 - 10.3 mg/dL 9.2  Total Protein 6.5 - 8.1 g/dL 7.8  Total Bilirubin 0.3 - 1.2 mg/dL 0.8  Alkaline Phos 38 - 126 U/L 48  AST 15 - 41 U/L 17  ALT 0 - 44 U/L 12   Edinburgh Score: Edinburgh Postnatal Depression Scale Screening Tool 11/12/2020  I have been able to laugh and see the funny side of things. 0  I have looked forward with enjoyment to things. 0  I have blamed myself unnecessarily when things went wrong. 0  I have been anxious or worried for no good reason. 2  I have felt scared or panicky for no good reason. 2  Things have been getting on top of me. 1  I have been so unhappy that I  have had difficulty sleeping. 0  I have felt sad or miserable. 0  I have been so unhappy that I have been crying. 0  The thought of harming myself has occurred to me. 0  Edinburgh Postnatal Depression Scale Total 5      After visit meds:  Allergies as of 11/13/2020   No Known Allergies      Medication List     TAKE these medications    ibuprofen 600 MG tablet Commonly known as: ADVIL Take 1 tablet (600 mg total) by mouth every 6 (six) hours.   oxyCODONE-acetaminophen 5-325 MG tablet Commonly known as: PERCOCET/ROXICET Take 1 tablet by mouth every 4 (four) hours as needed for moderate pain (pain score 4-7/10).   pantoprazole 20 MG tablet Commonly known as: Protonix Take 1 tablet (20 mg total) by mouth daily.   Vitafol Gummies 3.33-0.333-34.8 MG Chew Chew 1 capsule by mouth daily.                Discharge Care Instructions  (From admission, onward)           Start     Ordered   11/13/20 0000  Leave dressing on - Keep it clean, dry, and intact until clinic visit        11/13/20 1124             Discharge home in stable condition Infant Feeding: Breast Infant Disposition:home with mother Discharge instruction: per After Visit Summary and Postpartum booklet. Activity: Advance as tolerated. Pelvic rest for 6 weeks.  Diet: routine diet Anticipated Birth Control:  vasectomy Postpartum Appointment:1 week Additional Postpartum F/U: Incision check 1 week Future Appointments: Future Appointments  Date Time Provider [place]  11/19/2020  1:50 PM Will Bonnet, MD [facility] None  12/23/2020  1:50 PM Will Bonnet, MD [facility] None   Follow up Visit:  Follow-up Information     Will Bonnet, MD. Schedule an appointment as soon as possible for a visit in 1 week(s).   Specialty: Obstetrics and Gynecology Why: For incision check Contact information: [address] [place] [address] [phone]                 SIGNED: Imagene Riches, CNM  11/13/2020 11:27 AM

## 2020-11-12 ENCOUNTER — Encounter: Payer: Self-pay | Admitting: Obstetrics and Gynecology

## 2020-11-12 ENCOUNTER — Encounter: Payer: Medicaid Other | Admitting: Obstetrics and Gynecology

## 2020-11-12 LAB — CBC
HCT: 25.7 % — ABNORMAL LOW (ref 36.0–46.0)
Hemoglobin: 8.1 g/dL — ABNORMAL LOW (ref 12.0–15.0)
MCH: 25.4 pg — ABNORMAL LOW (ref 26.0–34.0)
MCHC: 31.5 g/dL (ref 30.0–36.0)
MCV: 80.6 fL (ref 80.0–100.0)
Platelets: 141 10*3/uL — ABNORMAL LOW (ref 150–400)
RBC: 3.19 MIL/uL — ABNORMAL LOW (ref 3.87–5.11)
RDW: 15.9 % — ABNORMAL HIGH (ref 11.5–15.5)
WBC: 23.1 10*3/uL — ABNORMAL HIGH (ref 4.0–10.5)
nRBC: 0.3 % — ABNORMAL HIGH (ref 0.0–0.2)

## 2020-11-12 LAB — FETAL SCREEN: Fetal Screen: NEGATIVE

## 2020-11-12 MED ORDER — RHO D IMMUNE GLOBULIN 1500 UNIT/2ML IJ SOSY
300.0000 ug | PREFILLED_SYRINGE | Freq: Once | INTRAMUSCULAR | Status: AC
  Administered 2020-11-12: 300 ug via INTRAVENOUS
  Filled 2020-11-12: qty 2

## 2020-11-12 NOTE — Progress Notes (Addendum)
Obstetric Postpartum/PostOperative Daily Progress Note Subjective:  30 y.o. [id number] post-operative day # 1 status post repeat cesarean delivery.  She is ambulating, is tolerating po, is voiding spontaneously.  Her pain is well controlled on PO pain medications and On Q pump. Her lochia is less than menses. She reports breastfeeding is going well.   Medications SCHEDULED MEDICATIONS   acetaminophen  1,000 mg Oral Q6H   ferrous sulfate  325 mg Oral BID WC   ibuprofen  600 mg Oral Q6H   ketorolac  30 mg Intravenous Q6H   Or   ketorolac  30 mg Intramuscular Q6H   prenatal multivitamin  1 tablet Oral Q1200   scopolamine  1 patch Transdermal Once   senna-docusate  2 tablet Oral Q24H   simethicone  80 mg Oral TID PC    MEDICATION INFUSIONS   lactated ringers     naLOXone (NARCAN) adult infusion for PRURITIS      PRN MEDICATIONS  coconut oil, witch hazel-glycerin **AND** dibucaine, diphenhydrAMINE, menthol-cetylpyridinium, nalbuphine **OR** nalbuphine, naloxone **AND** sodium chloride flush, naLOXone (NARCAN) adult infusion for PRURITIS, ondansetron (ZOFRAN) IV, oxyCODONE-acetaminophen **OR** oxyCODONE-acetaminophen    Objective:   Vitals:   11/11/20 1934 11/11/20 2313 11/12/20 0425 11/12/20 0811  BP: 106/65 104/65 109/71 (!) 103/58  Pulse: 63 (!) 47 (!) 49 (!) 52  Resp: 20 20 20 18   Temp: 98 F (36.7 C) 98.6 F (37 C) 97.7 F (36.5 C) 98.3 F (36.8 C)  TempSrc: Oral  Oral Oral  SpO2: 99% 100% 99% 98%  Weight:      Height:        Current Vital Signs 24h Vital Sign Ranges  T 98.3 F (36.8 C) Temp  Avg: 98.2 F (36.8 C)  Min: 97.7 F (36.5 C)  Max: 98.6 F (37 C)  BP (!) 103/58  BP  Min: 103/58  Max: 120/77  HR (!) 52  Pulse  Avg: 57.5  Min: 47  Max: 66  RR 18 Resp  Avg: 16.5  Min: 8  Max: 20  SaO2 98 % Room Air SpO2  Avg: 96.6 %  Min: 93 %  Max: 100 %       24 Hour I/O Current Shift I/O  Time Ins Outs 09/20 0701 - 09/21 0700 In: 1100 [I.V.:1000] Out: 2345  [Urine:1475] No intake/output data recorded.   General: NAD Pulmonary: no increased work of breathing Abdomen: non-distended, non-tender, fundus firm at level of umbilicus Inc: Clean/dry/intact, On Q intact Extremities: no edema, no erythema, no tenderness  Labs:  Recent Labs  Lab 11/11/20 0634 11/12/20 0601  WBC 12.7* 23.1*  HGB 10.2* 8.1*  HCT 32.1* 25.7*  PLT 143* 141*     Assessment:   29 y.o. 11/14/20 postoperative day # 1 status post repeat cesarean section, lactating  Plan:  1) Acute blood loss anemia - hemodynamically stable and asymptomatic - po ferrous sulfate  2) O NEG / Baby is Rh positive- Rhogam indicated  3) Rubella 3.54 (03/22 1048)/ Varicella Immune  4) TDAP status up to date  5) Breastfeeding  6) Contraception = vasectomy  7) Disposition: continue current care   03-28-1978, CNM 11/12/2020 10:17 AM

## 2020-11-12 NOTE — Anesthesia Post-op Follow-up Note (Signed)
  Anesthesia Pain Follow-up Note  Patient: Jessica Mitchell  Day #: 1  Date of Follow-up: 11/12/2020 Time: 8:51 AM  Last Vitals:  Vitals:   11/12/20 0425 11/12/20 0811  BP: 109/71 (!) 103/58  Pulse: (!) 49 (!) 52  Resp: 20 18  Temp: 36.5 C 36.8 C  SpO2: 99% 98%    Level of Consciousness: alert  Pain: none   Side Effects:None  Catheter Site Exam:clean, dry, no drainage     Plan: D/C from anesthesia care at surgeon's request  Rosanne Gutting

## 2020-11-12 NOTE — Lactation Note (Signed)
This note was copied from a baby's chart. Lactation Consultation Note  Patient Name: Jessica Mitchell OZDGU'Y Date: 11/12/2020 Reason for consult: Follow-up assessment;1st time breastfeeding;Early term 37-38.6wks;Other (Comment) (c-section) Age:32 hours  Lactation follow-up. Mom and baby fed well overnight, cluster feeding reported. Baby sleeping soundly and mom eating breakfast.  LC praised mom to her dedication of breastfeeding with baby. Reviewed the normalcy of cluster feeding and common periods overnight. Encouraged continued on demand feedings today, aiming for 8 or more feedings over this 24 hour period and 2 pees 2 poops for output.  Encouraged hand expression to encourage a deep latch, continued good position and alignment, and monitoring of how it feels at the breast.  Encouraged to call with any questions today and for ongoing BF support as needed.  Maternal Data Has patient been taught Hand Expression?: Yes Does the patient have breastfeeding experience prior to this delivery?: No (did not BF her others)  Feeding Mother's Current Feeding Choice: Breast Milk  LATCH Score                    Lactation Tools Discussed/Used    Interventions Interventions: Breast feeding basics reviewed;Education;Hand express  Discharge    Consult Status Consult Status: Follow-up Date: 11/12/20 Follow-up type: In-patient    Danford Bad 11/12/2020, 10:40 AM

## 2020-11-12 NOTE — Anesthesia Postprocedure Evaluation (Signed)
Anesthesia Post Note  Patient: Jessica Mitchell  Procedure(s) Performed: CESAREAN SECTION  Patient location during evaluation: Mother Baby Anesthesia Type: Spinal Level of consciousness: oriented and awake and alert Pain management: pain level controlled Vital Signs Assessment: post-procedure vital signs reviewed and stable Respiratory status: spontaneous breathing and respiratory function stable Cardiovascular status: blood pressure returned to baseline and stable Postop Assessment: no headache, no backache, no apparent nausea or vomiting and able to ambulate Anesthetic complications: no   No notable events documented.   Last Vitals:  Vitals:   11/12/20 0425 11/12/20 0811  BP: 109/71 (!) 103/58  Pulse: (!) 49 (!) 52  Resp: 20 18  Temp: 36.5 C 36.8 C  SpO2: 99% 98%    Last Pain:  Vitals:   11/12/20 0830  TempSrc:   PainSc: 7                  Malachi Pro C

## 2020-11-12 NOTE — TOC Initial Note (Signed)
Transition of Care [facility]) - Initial/Assessment Note    Patient Details  Name: Jessica Mitchell MRN: [id number] Date of Birth: 06-Apr-1990  Transition of Care [facility]) CM/SW Contact:    Hetty Ely, RN Phone Number: 11/12/2020, 11:50 AM  Clinical Narrative:  Spoke with MOB, NB at bedside asleep. MOB says she lives with FOB and other kids, a set of twins age 32 and a 2yo. MOB says she has everything needed for the baby, name Charleigh. Discussed prior drug use, MOB says she used marijuana before finding out she was pregnant and none since. Urine drug screen negative. MOB denies need for resources says she did not need it. Discussed PPD, MOB says she had it with the twins however did not with the 32yo. MOB says she has history with severe depression in the past, however no problems in the past 73mos. Denies depression medication, no MHP.  MOB says she is aware of MHP resources in the community and will seek assistance if needed, fully aware of signs. No TOC needs assessed at this time can discharge when medically stable.                        Patient Goals and CMS Choice        Expected Discharge Plan and Services                                                Prior Living Arrangements/Services                       Activities of Daily Living Home Assistive Devices/Equipment: None ADL Screening (condition at time of admission) Patient's cognitive ability adequate to safely complete daily activities?: Yes Is the patient deaf or have difficulty hearing?: No Does the patient have difficulty seeing, even when wearing glasses/contacts?: No Does the patient have difficulty concentrating, remembering, or making decisions?: No Patient able to express need for assistance with ADLs?: Yes Does the patient have difficulty dressing or bathing?: No Independently performs ADLs?: Yes (appropriate for developmental age) Does the patient have difficulty walking or climbing  stairs?: No Weakness of Legs: None Weakness of Arms/Hands: None  Permission Sought/Granted                  Emotional Assessment              Admission diagnosis:  History of cesarean delivery [Z98.891] Patient Active Problem List   Diagnosis Date Noted   Delayed delivery after SROM (spontaneous rupture of membranes) 11/11/2020   [redacted] weeks gestation of pregnancy 11/11/2020   Rh negative state in antepartum period 11/05/2020   Irregular uterine contractions 10/22/2020   Marijuana user 04/25/2020   Supervision of other normal pregnancy, antepartum 04/15/2020   Acute blood loss anemia 04/04/2018   History of cesarean delivery 04/02/2018   Depression    Anxiety    History of abnormal cervical Pap smear 01/31/2015   PCP:  Patient, No Pcp Per (Inactive) Pharmacy:   [id number] [place], [place] - [address] AT [facility] [address] [place] [address] Phone: [phone] Fax: [phone]     Social Determinants of Health (SDOH) Interventions    Readmission Risk Interventions No flowsheet data found.

## 2020-11-13 LAB — RHOGAM INJECTION: Unit division: 0

## 2020-11-13 MED ORDER — IBUPROFEN 600 MG PO TABS: 600.0000 mg | ORAL_TABLET | Freq: Four times a day (QID) | ORAL | 0 refills | Status: DC

## 2020-11-13 MED ORDER — IBUPROFEN 600 MG PO TABS
600.0000 mg | ORAL_TABLET | Freq: Four times a day (QID) | ORAL | Status: DC
  Administered 2020-11-13 – 2020-11-14 (×5): 600 mg via ORAL
  Filled 2020-11-13 (×5): qty 1

## 2020-11-13 MED ORDER — OXYCODONE-ACETAMINOPHEN 5-325 MG PO TABS: 1.0000 | ORAL_TABLET | ORAL | 0 refills | Status: DC | PRN

## 2020-11-13 NOTE — Lactation Note (Signed)
This note was copied from a baby's chart. Lactation Consultation Note  Patient Name: Jessica Mitchell GYJEH'U Date: 11/13/2020 Reason for consult: Follow-up assessment Age:32 hours  Lactation follow-up prior to anticipated discharge. Baby fed well overnight, some cluster feeding, mom independent with feedings. Baby passed all 24/36hr screen, no concerns.   Baby active at breast when [facility] entered, doing well, swallows identified and baby positioned well with wide latch. Mom notes some soreness/tenderness and preference of R over L breast. Using coconut oil and comfort gels to maintain tenderness.  LC reassured mom that preference was normal, suggested offering feedings on L breast with earliest cues, hand expression/pumping L breast if baby continues to struggle. Reviewed nipple tenderness and care.  Guidance given for anticipated breast changes, management of breast fullness, softening of breast prior to latching infant if needed to obtain deep latch.  Information for outpatient lactation services provided. Encouraged to call with any questions/concerns and for ongoing BF support as needed.  Maternal Data Has patient been taught Hand Expression?: Yes Does the patient have breastfeeding experience prior to this delivery?: No  Feeding Mother's Current Feeding Choice: Breast Milk  LATCH Score Latch:  (baby already latched; mom independent)                  Lactation Tools Discussed/Used    Interventions Interventions: Breast feeding basics reviewed;Pre-pump if needed;Education;Hand express  Discharge Discharge Education: Engorgement and breast care;Outpatient recommendation  Consult Status Consult Status: Complete Date: 11/13/20 Follow-up type: Call as needed    Danford Bad 11/13/2020, 10:23 AM

## 2020-11-13 NOTE — Progress Notes (Signed)
Subjective: Postpartum Day 2: Cesarean Delivery Patient reports incisional pain, tolerating PO, + flatus, and no problems voiding.  She was started on Lexapro early in the pregnancy due to some mood/depressive issues. She did not take the medication- but may reconsider if she struggles emotionally once home...  Objective: Vital signs in last 24 hours: Temp:  [98 F (36.7 C)-98.4 F (36.9 C)] 98.4 F (36.9 C) (09/22 0756) Pulse Rate:  [60-65] 63 (09/22 0756) Resp:  [17-20] 20 (09/22 0756) BP: (111-127)/(62-76) 111/62 (09/22 0756) SpO2:  [98 %-100 %] 98 % (09/22 0756)  Physical Exam:  General: cooperative, fatigued, and no distress Lochia: appropriate Uterine Fundus: firm Incision: healing well, no significant drainage DVT Evaluation: No evidence of DVT seen on physical exam. Negative Homan's sign.  Recent Labs    11/11/20 0634 11/12/20 0601  HGB 10.2* 8.1*  HCT 32.1* 25.7*    Assessment/Plan: Status post Cesarean section. Doing well postoperatively.  Continue current care Anticipate discharge tomorrow.Mirna Mires 11/13/2020, 10:32 AM

## 2020-11-14 NOTE — Progress Notes (Signed)
Pt d/c home in the company of baby. D/c instructions reviewed with pt and she verbalized understanding.

## 2020-11-14 NOTE — Discharge Instructions (Signed)

## 2020-11-14 NOTE — Discharge Summary (Signed)
OB Discharge Summary     Patient Name: Jessica Mitchell DOB: 03/22/1990 MRN: [id number]  Date of admission: 11/11/2020 Delivering MD: Jessica Mitchell Date of Delivery: 11/11/2020  Date of discharge: 11/14/2020  Admitting diagnosis: History of cesarean delivery [Z98.891] Intrauterine pregnancy: [redacted]w[redacted]d     Secondary diagnosis: None     Discharge diagnosis: Term Pregnancy Delivered                                                                                                Post partum procedures:rhogam  Augmentation: n/a  Complications: None  Hospital course:  Sceduled C/S   30 y.o. yo G3P2104 at [redacted]w[redacted]d was admitted to the hospital 11/11/2020 for scheduled cesarean section with the following indication:Elective Repeat.Delivery details are as follows:  Membrane Rupture Time/Date: 5:00 AM ,11/11/2020   Delivery Method:C-Section, Low Transverse  Details of operation can be found in separate operative note.  Patient had an uncomplicated postpartum course.  She is ambulating, tolerating a regular diet, passing flatus, and urinating well. Patient is discharged home in stable condition on  11/14/20        Newborn Data: Birth date:11/11/2020  Birth time:8:57 AM  Gender:Female  Living status:Living  Apgars:8 ,9  Weight:2790 g     Physical exam  Vitals:   11/13/20 0756 11/13/20 1658 11/13/20 2305 11/14/20 0850  BP: 111/62 121/73 123/77 130/79  Pulse: 63 60 65 60  Resp: 20 18 18 20   Temp: 98.4 F (36.9 C) 98.5 F (36.9 C) 97.7 F (36.5 C) 97.6 F (36.4 C)  TempSrc: Oral Axillary Oral Axillary  SpO2: 98% 99% 99% 98%  Weight:      Height:       General: alert, cooperative, and no distress Lochia: appropriate Uterine Fundus: firm Incision: Dressing is clean, dry, and intact, On Q intact DVT Evaluation: No evidence of DVT seen on physical exam.  Labs: Lab Results  Component Value Date   WBC 23.1 (H) 11/12/2020   HGB 8.1 (L) 11/12/2020   HCT 25.7 (L) 11/12/2020   MCV 80.6  11/12/2020   PLT 141 (L) 11/12/2020    Discharge instruction: per After Visit Summary.  Medications:  Allergies as of 11/14/2020   No Known Allergies      Medication List     TAKE these medications    ibuprofen 600 MG tablet Commonly known as: ADVIL Take 1 tablet (600 mg total) by mouth every 6 (six) hours.   oxyCODONE-acetaminophen 5-325 MG tablet Commonly known as: PERCOCET/ROXICET Take 1 tablet by mouth every 4 (four) hours as needed for moderate pain (pain score 4-7/10).   pantoprazole 20 MG tablet Commonly known as: Protonix Take 1 tablet (20 mg total) by mouth daily.   Vitafol Gummies 3.33-0.333-34.8 MG Chew Chew 1 capsule by mouth daily.               Discharge Care Instructions  (From admission, onward)           Start     Ordered   11/14/20 0000  Discharge wound care:       Comments: Keep incision dry, clean.  11/14/20 0907   11/13/20 0000  Leave dressing on - Keep it clean, dry, and intact until clinic visit        11/13/20 1124            Diet: routine diet  Activity: Advance as tolerated. Pelvic rest for 6 weeks.   Outpatient follow up:  Follow-up Information     Jessica Novak, MD. Go in 1 week(s).   Specialty: Obstetrics and Gynecology Why: For incision check Contact information: [address] [place] [address] [phone]                   Postpartum contraception: Vasectomy Rhogam Given postpartum: yes Rubella vaccine given postpartum: immune Varicella vaccine given postpartum: immune TDaP given antepartum or postpartum: antepartum    Newborn Delivery   Birth date/time: 11/11/2020 08:57:00 Delivery type: C-Section, Low Transverse Trial of labor: No C-section categorization: Repeat       Baby Feeding: Breast  Disposition:home with mother  SIGNED:  Tresea Mitchell, CNM 11/14/2020 9:07 AM

## 2020-11-18 ENCOUNTER — Encounter: Payer: Medicaid Other | Admitting: Obstetrics and Gynecology

## 2020-11-19 ENCOUNTER — Other Ambulatory Visit: Payer: Medicaid Other

## 2020-11-19 ENCOUNTER — Ambulatory Visit (INDEPENDENT_AMBULATORY_CARE_PROVIDER_SITE_OTHER): Payer: Medicaid Other | Admitting: Obstetrics and Gynecology

## 2020-11-19 ENCOUNTER — Other Ambulatory Visit: Payer: Self-pay

## 2020-11-19 ENCOUNTER — Encounter: Payer: Self-pay | Admitting: Obstetrics and Gynecology

## 2020-11-19 VITALS — BP 118/74 | Wt 136.0 lb

## 2020-11-19 DIAGNOSIS — Z09 Encounter for follow-up examination after completed treatment for conditions other than malignant neoplasm: Secondary | ICD-10-CM

## 2020-11-19 NOTE — Progress Notes (Signed)
   Postoperative Follow-up Patient presents post op from cesarean section  8 weeks  ago.  Subjective: She denies fever, chills, nausea, and vomiting. Eating a regular diet without difficulty. The patient is not having any pain.  Activity: increasing slowly. She denies issues with her incision.    EPDS: 3  Objective: BP 118/74   Wt 136 lb (61.7 kg)   Breastfeeding Yes   BMI 24.09 kg/m   Physical Exam Constitutional:      General: She is not in acute distress.    Appearance: Normal appearance.  HENT:     Head: Normocephalic and atraumatic.  Eyes:     General: No scleral icterus.    Conjunctiva/sclera: Conjunctivae normal.  Abdominal:     General: There is no distension.     Palpations: Abdomen is soft. There is mass (uterine fundus firm at U-5).     Tenderness: There is no abdominal tenderness. There is no guarding or rebound.     Comments: Incision: without erythema, induration, warmth, and tenderness. It is clean, dry, and intact.     Neurological:     General: No focal deficit present.     Mental Status: She is alert and oriented to person, place, and time.     Cranial Nerves: No cranial nerve deficit.  Psychiatric:        Mood and Affect: Mood normal.        Behavior: Behavior normal.        Judgment: Judgment normal.    Assessment: 30 y.o. s/p cesarean section progressing well  Plan: Patient has done well after surgery with no apparent complications.  I have discussed the post-operative course to date, and the expected progress moving forward.  The patient understands what complications to be concerned about.    Activity plan: increase slowly. Plans for vasectomy. Discussed bridging contraception until totally clear from vasectomy.   Return in about 5 weeks (around 12/24/2020) for Six Week Postpartum.  Thomasene Mohair, MD 11/19/2020 2:14 PM

## 2020-11-24 ENCOUNTER — Other Ambulatory Visit: Payer: Medicaid Other

## 2020-11-25 ENCOUNTER — Inpatient Hospital Stay: Admit: 2020-11-25 | Payer: Medicaid Other | Admitting: Obstetrics and Gynecology

## 2020-12-02 ENCOUNTER — Ambulatory Visit: Payer: Medicaid Other | Admitting: Obstetrics and Gynecology

## 2020-12-16 NOTE — Telephone Encounter (Signed)
R/S to 05/30/2018. Nexplanon not rcvd

## 2020-12-23 ENCOUNTER — Ambulatory Visit: Payer: Medicaid Other | Admitting: Obstetrics and Gynecology

## 2020-12-26 ENCOUNTER — Ambulatory Visit: Payer: Medicaid Other | Admitting: Obstetrics and Gynecology

## 2021-01-06 ENCOUNTER — Ambulatory Visit: Payer: Medicaid Other | Admitting: Obstetrics and Gynecology

## 2021-09-29 ENCOUNTER — Other Ambulatory Visit: Payer: Self-pay | Admitting: Nurse Practitioner

## 2021-09-29 DIAGNOSIS — N920 Excessive and frequent menstruation with regular cycle: Secondary | ICD-10-CM

## 2021-10-01 ENCOUNTER — Ambulatory Visit: Payer: Medicaid Other | Admitting: Nurse Practitioner

## 2021-10-02 ENCOUNTER — Ambulatory Visit: Admission: RE | Admit: 2021-10-02 | Payer: Medicaid Other | Source: Ambulatory Visit

## 2021-11-12 ENCOUNTER — Ambulatory Visit: Admission: RE | Admit: 2021-11-12 | Payer: Medicaid Other | Source: Ambulatory Visit

## 2022-02-25 ENCOUNTER — Ambulatory Visit: Payer: Medicaid Other

## 2022-03-04 ENCOUNTER — Ambulatory Visit: Payer: Medicaid Other

## 2022-03-08 ENCOUNTER — Ambulatory Visit: Admission: RE | Admit: 2022-03-08 | Payer: Medicaid Other | Source: Ambulatory Visit

## 2022-04-26 ENCOUNTER — Ambulatory Visit (LOCAL_COMMUNITY_HEALTH_CENTER): Payer: Medicaid Other

## 2022-04-26 VITALS — BP 131/84 | Ht 63.0 in | Wt 136.0 lb

## 2022-04-26 DIAGNOSIS — Z3201 Encounter for pregnancy test, result positive: Secondary | ICD-10-CM | POA: Diagnosis not present

## 2022-04-26 LAB — PREGNANCY, URINE: Preg Test, Ur: POSITIVE — AB

## 2022-04-26 MED ORDER — PRENATAL 27-0.8 MG PO TABS
1.0000 | ORAL_TABLET | Freq: Every day | ORAL | 0 refills | Status: DC
Start: 2022-04-26 — End: 2022-04-29

## 2022-04-26 NOTE — Progress Notes (Signed)
UPT positive. Plans prenatal care at [facility]. States plans to call provider today to schedule appt. Plans to contact DSS tomorrow for Medicaid/preg women. Positive preg packet given and reviewed.  The patient was dispensed prenatal vitamins #100 today per SO Dr Vertell Novak. I provided counseling today regarding the medication. We discussed the medication, the side effects and when to call clinic. Patient given the opportunity to ask questions. Questions answered.  Josie Saunders, RN

## 2022-04-28 ENCOUNTER — Ambulatory Visit (INDEPENDENT_AMBULATORY_CARE_PROVIDER_SITE_OTHER): Payer: Medicaid Other

## 2022-04-28 DIAGNOSIS — Z3687 Encounter for antenatal screening for uncertain dates: Secondary | ICD-10-CM

## 2022-04-28 DIAGNOSIS — Z1379 Encounter for other screening for genetic and chromosomal anomalies: Secondary | ICD-10-CM

## 2022-04-28 DIAGNOSIS — Z113 Encounter for screening for infections with a predominantly sexual mode of transmission: Secondary | ICD-10-CM

## 2022-04-28 DIAGNOSIS — Z3689 Encounter for other specified antenatal screening: Secondary | ICD-10-CM

## 2022-04-28 DIAGNOSIS — Z3480 Encounter for supervision of other normal pregnancy, unspecified trimester: Secondary | ICD-10-CM

## 2022-04-28 NOTE — Progress Notes (Signed)
New OB Intake  I connected with  Jessica Mitchell on 04/28/22 at  2:15 PM EST by telephone and verified that I am speaking with the correct person using two identifiers. Nurse is located at [employer] and pt is located at work.  I explained I am completing New OB Intake today. We discussed her EDD of 12/05/22 that is based on LMP of 02/28/22. Pt is G4/P2104. I reviewed her allergies, medications, Medical/Surgical/OB history, and appropriate screenings. There are no cats in the home. Based on history, this is a/an pregnancy uncomplicated .   Patient Active Problem List   Diagnosis Date Noted   Delayed delivery after SROM (spontaneous rupture of membranes) 11/11/2020   [redacted] weeks gestation of pregnancy 11/11/2020   Rh negative state in antepartum period 11/05/2020   Irregular uterine contractions 10/22/2020   Marijuana user 04/25/2020   Supervision of other normal pregnancy, antepartum 04/15/2020   Acute blood loss anemia 04/04/2018   History of cesarean delivery 04/02/2018   Depression    Anxiety    History of abnormal cervical Pap smear 01/31/2015    Concerns addressed today: None  Delivery Plans:  Plans to deliver at [facility].  Anatomy US Explained first scheduled [place] will be soon and Anatomy US will be done at 20 weeks of gestational age.  Labs Discussed genetic screening with patient. Patient desires genetic testing to be drawn with new OB labs. Discussed possible labs to be drawn at new OB appointment.  COVID Vaccine Patient has had two COVID vaccines.   Social Determinants of Health Food Insecurity: denies food insecurity Transportation: Patient denies transportation needs. Childcare: Discussed no children allowed at ultrasound appointments.   First visit review I reviewed new OB appt with pt. I explained she will have bloodwork and pap smear/pelvic exam if indicated. Explained pt will be seen by Gigi Gin, CNM at first visit; encounter routed  to appropriate provider.   Drenda Freeze, [facility] 04/28/2022  2:14 PM

## 2022-04-28 NOTE — Assessment & Plan Note (Addendum)
  Clinical Staff Provider  Office Location  [facility] Dating  Not found.  Language  English Anatomy US    Flu Vaccine  Offer Genetic Screen  NIPS:   TDaP vaccine   Offer Hgb A1C or  GTT Early : Third trimester :   Covid    LAB RESULTS   Rhogam     Blood Type     Feeding Plan Breastfeed Antibody    Contraception Undecided Rubella    Circumcision Yes RPR     Pediatrician  [facility]     Support Person [facility] HIV    Prenatal Classes No Varicella     GBS  (For PCN allergy, check sensitivities)   BTL Consent  Hep C     VBAC Consent  Pap Diagnosis  Date Value Ref Range Status  04/15/2020   Final   - Negative for intraepithelial lesion or malignancy (NILM)      Hgb Electro      CF      SMA

## 2022-04-29 ENCOUNTER — Telehealth: Payer: Self-pay

## 2022-04-29 DIAGNOSIS — Z3480 Encounter for supervision of other normal pregnancy, unspecified trimester: Secondary | ICD-10-CM

## 2022-04-29 MED ORDER — VITAFOL GUMMIES 3.33-0.333-34.8 MG PO CHEW: 1.0000 | CHEWABLE_TABLET | Freq: Every day | ORAL | 11 refills | Status: AC

## 2022-04-29 NOTE — Telephone Encounter (Signed)
TRIAGE VOICEMAIL: Patient is requesting a prescription that she previously had now that she is pregnant again. It's for prenatal and heartburn/indigestion.

## 2022-04-29 NOTE — Telephone Encounter (Signed)
Spoke with patient. She is unable to tolerate the PNV she was given at the health department. She is requesting the gummies she was give in 2022. Advised will send rx. Reviewed tips for indigestion in pregnancy. Advised will send in my chart as well.

## 2022-05-18 ENCOUNTER — Other Ambulatory Visit (HOSPITAL_COMMUNITY)
Admission: RE | Admit: 2022-05-18 | Discharge: 2022-05-18 | Disposition: A | Discharge status: Home / Self Care | Payer: Medicaid Other | Source: Ambulatory Visit | Attending: Obstetrics | Admitting: Obstetrics

## 2022-05-18 ENCOUNTER — Encounter: Payer: Self-pay | Admitting: Obstetrics

## 2022-05-18 ENCOUNTER — Other Ambulatory Visit: Payer: Medicaid Other

## 2022-05-18 ENCOUNTER — Ambulatory Visit
Admission: RE | Admit: 2022-05-18 | Discharge: 2022-05-18 | Disposition: A | Discharge status: Home / Self Care | Payer: Medicaid Other | Source: Ambulatory Visit | Attending: Obstetrics | Admitting: Obstetrics

## 2022-05-18 DIAGNOSIS — Z3480 Encounter for supervision of other normal pregnancy, unspecified trimester: Secondary | ICD-10-CM

## 2022-05-18 DIAGNOSIS — Z1379 Encounter for other screening for genetic and chromosomal anomalies: Secondary | ICD-10-CM

## 2022-05-18 DIAGNOSIS — Z3A10 10 weeks gestation of pregnancy: Secondary | ICD-10-CM | POA: Diagnosis not present

## 2022-05-18 DIAGNOSIS — Z113 Encounter for screening for infections with a predominantly sexual mode of transmission: Secondary | ICD-10-CM

## 2022-05-18 DIAGNOSIS — Z3481 Encounter for supervision of other normal pregnancy, first trimester: Secondary | ICD-10-CM | POA: Diagnosis not present

## 2022-05-18 DIAGNOSIS — Z3687 Encounter for antenatal screening for uncertain dates: Secondary | ICD-10-CM | POA: Diagnosis not present

## 2022-05-19 ENCOUNTER — Encounter: Payer: Self-pay | Admitting: Obstetrics

## 2022-05-19 ENCOUNTER — Other Ambulatory Visit: Payer: Self-pay | Admitting: Obstetrics

## 2022-05-19 DIAGNOSIS — A749 Chlamydial infection, unspecified: Secondary | ICD-10-CM

## 2022-05-19 DIAGNOSIS — A568 Sexually transmitted chlamydial infection of other sites: Secondary | ICD-10-CM | POA: Insufficient documentation

## 2022-05-19 LAB — URINALYSIS, ROUTINE W REFLEX MICROSCOPIC
Bilirubin, UA: NEGATIVE
Glucose, UA: NEGATIVE
Ketones, UA: NEGATIVE
Nitrite, UA: NEGATIVE
Protein,UA: NEGATIVE
RBC, UA: NEGATIVE
Specific Gravity, UA: 1.007 (ref 1.005–1.030)
Urobilinogen, Ur: 0.2 mg/dL (ref 0.2–1.0)
pH, UA: 6 (ref 5.0–7.5)

## 2022-05-19 LAB — MONITOR DRUG PROFILE 14(MW)
Amphetamine Scrn, Ur: NEGATIVE ng/mL
BARBITURATE SCREEN URINE: NEGATIVE ng/mL
BENZODIAZEPINE SCREEN, URINE: NEGATIVE ng/mL
Buprenorphine, Urine: NEGATIVE ng/mL
CANNABINOIDS UR QL SCN: NEGATIVE ng/mL
Cocaine (Metab) Scrn, Ur: NEGATIVE ng/mL
Creatinine(Crt), U: 36 mg/dL (ref 20.0–300.0)
Fentanyl, Urine: NEGATIVE pg/mL
Meperidine Screen, Urine: NEGATIVE ng/mL
Methadone Screen, Urine: NEGATIVE ng/mL
OXYCODONE+OXYMORPHONE UR QL SCN: NEGATIVE ng/mL
Opiate Scrn, Ur: NEGATIVE ng/mL
Ph of Urine: 5.4 (ref 4.5–8.9)
Phencyclidine Qn, Ur: NEGATIVE ng/mL
Propoxyphene Scrn, Ur: NEGATIVE ng/mL
SPECIFIC GRAVITY: 1.009
Tramadol Screen, Urine: NEGATIVE ng/mL

## 2022-05-19 LAB — NICOTINE SCREEN, URINE: Cotinine Ql Scrn, Ur: NEGATIVE ng/mL

## 2022-05-19 LAB — MICROSCOPIC EXAMINATION
Bacteria, UA: NONE SEEN
Casts: NONE SEEN /lpf
RBC, Urine: NONE SEEN /hpf (ref 0–2)

## 2022-05-19 LAB — URINE CYTOLOGY ANCILLARY ONLY
Chlamydia: POSITIVE — AB
Comment: NEGATIVE
Comment: NORMAL
Neisseria Gonorrhea: NEGATIVE

## 2022-05-19 MED ORDER — AZITHROMYCIN 500 MG PO TABS: 1000.0000 mg | ORAL_TABLET | Freq: Once | ORAL | 0 refills | Status: AC

## 2022-05-19 NOTE — Progress Notes (Unsigned)
Patient recently had her new OB labs drawn and is positive for Chlamydia infection. A RX for Azithromycin 1000 mg po is sent to her pharmacy. She isnotified via My chart, and I will have a staff person call her in the morning to follow up and reach her to be sure she gets the message of her + infection.  Imagene Riches, CNM  05/19/2022 9:22 PM

## 2022-05-20 ENCOUNTER — Telehealth: Payer: Self-pay

## 2022-05-20 LAB — CULTURE, OB URINE

## 2022-05-20 LAB — URINE CULTURE, OB REFLEX

## 2022-05-20 NOTE — Progress Notes (Signed)
OBSTETRIC INITIAL PRENATAL VISIT  Subjective:    Jessica Mitchell is being seen today for her first obstetrical visit.  This is not a planned pregnancy. She is a 32 y.o. [id number] female at [redacted]w[redacted]d gestation, Estimated Date of Delivery: 12/14/22 with Patient's last menstrual period was 02/28/2022 (within days).,  consistent with 10 week sono. Her obstetrical history is significant for  several CS deliveries . Relationship with FOB: spouse, living together. Patient does intend to breast feed. Pregnancy history fully reviewed.    OB History  Gravida Para Term Preterm AB Living  4 3 2 1  0 4  SAB IAB Ectopic Multiple Live Births  0 0 0 1 4    # Outcome Date GA Lbr Len/2nd Weight Sex Delivery Anes PTL Lv  4 Current           3 Term 11/11/20 [redacted]w[redacted]d  6 lb 2.4 oz (2.79 kg) F CS-LTranv Spinal  LIV     Name: [facility]     Apgar1: 8  Apgar5: 9  2 Term 04/02/18 [redacted]w[redacted]d  6 lb 9.5 oz (2.99 kg) M CS-LTranv Spinal  LIV     Name: [facility]     Apgar1: 8  Apgar5: 9  1A Preterm 07/18/07   4 lb (1.814 kg) M CS-LTranv  Y LIV  1B Preterm    3 lb (1.361 kg) M CS-LTranv  Y LIV    Gynecologic History:  Last pap smear was 04/15/20.  Results were Normal.  denies h/o abnormal pap smears in the past.  reports history of STIs. Chlamydia  Contraception prior to conception:    Past Medical History:  Diagnosis Date   Anxiety    Depression    depression and anxiety   Fatigue    Mucous in stools    Unintentional weight loss    Vaginal Pap smear, abnormal     Family History  Problem Relation Age of Onset   COPD Maternal Grandmother    Lung cancer Maternal Grandmother    Diabetes Maternal Grandfather    Hypertension Maternal Grandfather    Stroke Maternal Grandfather    Lung cancer Paternal Grandmother     Past Surgical History:  Procedure Laterality Date   CESAREAN SECTION  07/18/2007   twins   CESAREAN SECTION N/A 04/02/2018   Procedure: CESAREAN SECTION;  Surgeon: Will Bonnet, MD;  Location: [facility] ORS;  Service: Obstetrics;  Laterality: N/A;   CESAREAN SECTION N/A 11/11/2020   Procedure: CESAREAN SECTION;  Surgeon: Will Bonnet, MD;  Location: [facility] ORS;  Service: Obstetrics;  Laterality: N/A;   FINGER SURGERY Right    2/2 trauma    Social History   Socioeconomic History   Marital status: Married    Spouse name: Gaffer   Number of children: 4   Years of education: 14   Highest education level: High school graduate  Occupational History   Occupation: Radiation protection practitioner  Tobacco Use   Smoking status: Never   Smokeless tobacco: Never  Vaping Use   Vaping Use: Never used  Substance and Sexual Activity   Alcohol use: Not Currently    Comment: last use last week "4 shots liquor"   Drug use: Not Currently    Types: Marijuana    Comment: last mj use 2022   Sexual activity: Yes    Partners: Male    Birth control/protection: None    Comment: hx ocp and depo  Other Topics Concern   Not on file  Social History Narrative   Not on file   Social Determinants of Health   Financial Resource Strain: Low Risk  (04/28/2022)   Overall Financial Resource Strain (CARDIA)    Difficulty of Paying Living Expenses: Not hard at all  Food Insecurity: No Food Insecurity (04/28/2022)   Hunger Vital Sign    Worried About Running Out of Food in the Last Year: Never true    Ran Out of Food in the Last Year: Never true  Transportation Needs: No Transportation Needs (04/28/2022)   PRAPARE - Hydrologist (Medical): No    Lack of Transportation (Non-Medical): No  Physical Activity: Inactive (04/28/2022)   Exercise Vital Sign    Days of Exercise per Week: 0 days    Minutes of Exercise per Session: 0 min  Stress: No Stress Concern Present (04/28/2022)   [place]    Feeling of Stress : Not at all  Social Connections: Moderately Integrated (04/28/2022)    Social Connection and Isolation Panel [NHANES]    Frequency of Communication with Friends and Family: Three times a week    Frequency of Social Gatherings with Friends and Family: Twice a week    Attends Religious Services: More than 4 times per year    Active Member of [employer] or Organizations: No    Attends Archivist Meetings: Never    Marital Status: Married  Human resources officer Violence: Not At Risk (04/28/2022)   Humiliation, Afraid, Rape, and Kick questionnaire    Fear of Current or Ex-Partner: No    Emotionally Abused: No    Physically Abused: No    Sexually Abused: No    Current Outpatient Medications on File Prior to Visit  Medication Sig Dispense Refill   Prenatal Vit-Fe Phos-FA-Omega (VITAFOL GUMMIES) 3.33-0.333-34.8 MG CHEW Chew 1 tablet by mouth daily. 30 tablet 11   No current facility-administered medications on file prior to visit.    No Known Allergies   Review of Systems General: Not Present- Fever, Weight Loss and Weight Gain. Skin: Not Present- Rash. HEENT: Not Present- Blurred Vision, Headache and Bleeding Gums. Respiratory: Not Present- Difficulty Breathing. Breast: Not Present- Breast Mass. Cardiovascular: Not Present- Chest Pain, Elevated Blood Pressure, Fainting / Blacking Out and Shortness of Breath. Gastrointestinal: Not Present- Abdominal Pain, Constipation, Nausea and Vomiting. Female Genitourinary: Not Present- Frequency, Painful Urination, Pelvic Pain, Vaginal Bleeding, Vaginal Discharge, Contractions, regular, Fetal Movements Decreased, Urinary Complaints and Vaginal Fluid. Musculoskeletal: Not Present- Back Pain and Leg Cramps. Neurological: Not Present- Dizziness. Psychiatric: Not Present- Depression.     Objective:   Last menstrual period 02/28/2022, currently breastfeeding.  There is no height or weight on file to calculate BMI.  General Appearance:    Alert, cooperative, no distress, appears stated age  Head:    Normocephalic,  without obvious abnormality, atraumatic  Eyes:    PERRL, conjunctiva/corneas clear, EOM's intact, both eyes  Ears:    Normal external ear canals, both ears  Nose:   Nares normal, septum midline, mucosa normal, no drainage or sinus tenderness  Throat:   Lips, mucosa, and tongue normal; teeth and gums normal  Neck:   Supple, symmetrical, trachea midline, no adenopathy; thyroid: no enlargement/tenderness/nodules; no carotid bruit or JVD  Back:     Symmetric, no curvature, ROM normal, no CVA tenderness  Lungs:     Clear to auscultation bilaterally, respirations unlabored  Chest Wall:    No tenderness or deformity  Heart:    Regular rate and rhythm, S1 and S2 normal, no murmur, rub or gallop  Breast Exam:    No tenderness, masses, or nipple abnormality  Abdomen:     Soft, non-tender, bowel sounds active all four quadrants, no masses, no organomegaly.  FHT not heard today   Genitalia:    Pelvic:external genitalia normal, vagina without lesions, discharge, or tenderness, rectovaginal septum  normal. Cervix normal in appearance, no cervical motion tenderness, no adnexal masses or tenderness.  Pregnancy positive findings: uterine enlargement: 11 wk size, nontender.   Rectal:    Normal external sphincter.  No hemorrhoids appreciated. Internal exam not done.   Extremities:   Extremities normal, atraumatic, no cyanosis or edema  Pulses:   2+ and symmetric all extremities  Skin:   Skin color, texture, turgor normal, no rashes or lesions  Lymph nodes:   Cervical, supraclavicular, and axillary nodes normal  Neurologic:   CNII-XII intact, normal strength, sensation and reflexes throughout     Assessment:   No diagnosis found.  Plan:   Supervision of normal/high risk pregnancy  - Initial labs reviewed. - Prenatal vitamins encouraged. - Problem list reviewed and updated. - New OB counseling:  The patient has been given an overview regarding routine prenatal care.  Recommendations regarding diet, weight  gain, and exercise in pregnancy were given. - Prenatal testing, optional genetic testing, and ultrasound use in pregnancy were reviewed.  Traditional genetic screening vs cell-fee DNA genetic screening discussed, including risks and benefits. Testing results reviewed. - Benefits of Breast Feeding were discussed. The patient is encouraged to consider nursing her baby post partum.  A RX for Sofran is provided for her as she is very nauseous. I have again ordered Azithro for her chlamydia. Her hubby is getting treated.  RTC 4 weeks.  There are no diagnoses linked to this encounter.    Follow up in 4 weeks.    Gigi Gin, [facility]

## 2022-05-20 NOTE — Patient Instructions (Signed)

## 2022-05-21 LAB — CBC/D/PLT+RPR+RH+ABO+RUBIGG...
Antibody Screen: NEGATIVE
Basophils Absolute: 0.1 10*3/uL (ref 0.0–0.2)
Basos: 1 %
EOS (ABSOLUTE): 0.2 10*3/uL (ref 0.0–0.4)
Eos: 2 %
HCV Ab: NONREACTIVE
HIV Screen 4th Generation wRfx: NONREACTIVE
Hematocrit: 36.8 % (ref 34.0–46.6)
Hemoglobin: 12.1 g/dL (ref 11.1–15.9)
Hepatitis B Surface Ag: NEGATIVE
Immature Grans (Abs): 0 10*3/uL (ref 0.0–0.1)
Immature Granulocytes: 0 %
Lymphocytes Absolute: 3.1 10*3/uL (ref 0.7–3.1)
Lymphs: 26 %
MCH: 27.4 pg (ref 26.6–33.0)
MCHC: 32.9 g/dL (ref 31.5–35.7)
MCV: 83 fL (ref 79–97)
Monocytes Absolute: 0.6 10*3/uL (ref 0.1–0.9)
Monocytes: 5 %
Neutrophils Absolute: 7.8 10*3/uL — ABNORMAL HIGH (ref 1.4–7.0)
Neutrophils: 66 %
Platelets: 211 10*3/uL (ref 150–450)
RBC: 4.41 x10E6/uL (ref 3.77–5.28)
RDW: 15.1 % (ref 11.7–15.4)
RPR Ser Ql: NONREACTIVE
Rh Factor: NEGATIVE
Rubella Antibodies, IGG: 2.97 index (ref >0.99)
Varicella zoster IgG: 1038 index (ref >165)
WBC: 11.8 10*3/uL — ABNORMAL HIGH (ref 3.4–10.8)

## 2022-05-21 LAB — HGB FRACTIONATION CASCADE
Hgb A2: 2.4 % (ref 1.8–3.2)
Hgb A: 97.6 % (ref 96.4–98.8)
Hgb F: 0 % (ref 0.0–2.0)
Hgb S: 0 %

## 2022-05-21 LAB — HCV INTERPRETATION

## 2022-05-23 LAB — MATERNIT 21 PLUS CORE, BLOOD
Fetal Fraction: 4
Result (T21): NEGATIVE
Trisomy 13 (Patau syndrome): NEGATIVE
Trisomy 18 (Edwards syndrome): NEGATIVE
Trisomy 21 (Down syndrome): NEGATIVE

## 2022-05-25 ENCOUNTER — Ambulatory Visit (INDEPENDENT_AMBULATORY_CARE_PROVIDER_SITE_OTHER): Payer: Medicaid Other | Admitting: Obstetrics

## 2022-05-25 ENCOUNTER — Encounter: Payer: Self-pay | Admitting: Obstetrics

## 2022-05-25 ENCOUNTER — Other Ambulatory Visit (HOSPITAL_COMMUNITY)
Admission: RE | Admit: 2022-05-25 | Discharge: 2022-05-25 | Disposition: A | Discharge status: Home / Self Care | Payer: Medicaid Other | Source: Ambulatory Visit | Attending: Obstetrics | Admitting: Obstetrics

## 2022-05-25 VITALS — BP 136/77 | HR 80 | Wt 135.9 lb

## 2022-05-25 DIAGNOSIS — A749 Chlamydial infection, unspecified: Secondary | ICD-10-CM

## 2022-05-25 DIAGNOSIS — Z113 Encounter for screening for infections with a predominantly sexual mode of transmission: Secondary | ICD-10-CM

## 2022-05-25 DIAGNOSIS — A568 Sexually transmitted chlamydial infection of other sites: Secondary | ICD-10-CM

## 2022-05-25 DIAGNOSIS — Z3401 Encounter for supervision of normal first pregnancy, first trimester: Secondary | ICD-10-CM

## 2022-05-25 DIAGNOSIS — Z124 Encounter for screening for malignant neoplasm of cervix: Secondary | ICD-10-CM | POA: Insufficient documentation

## 2022-05-25 DIAGNOSIS — O34219 Maternal care for unspecified type scar from previous cesarean delivery: Secondary | ICD-10-CM

## 2022-05-25 DIAGNOSIS — Z3A10 10 weeks gestation of pregnancy: Secondary | ICD-10-CM | POA: Diagnosis not present

## 2022-05-25 DIAGNOSIS — O219 Vomiting of pregnancy, unspecified: Secondary | ICD-10-CM

## 2022-05-25 LAB — POCT URINALYSIS DIPSTICK OB
Bilirubin, UA: NEGATIVE
Glucose, UA: NEGATIVE
Ketones, UA: NEGATIVE
Leukocytes, UA: NEGATIVE
Nitrite, UA: NEGATIVE
Spec Grav, UA: 1.025 (ref 1.010–1.025)
Urobilinogen, UA: 0.2 E.U./dL
pH, UA: 5 (ref 5.0–8.0)

## 2022-05-25 MED ORDER — ONDANSETRON 4 MG PO TBDP: 4.0000 mg | ORAL_TABLET | Freq: Four times a day (QID) | ORAL | 0 refills | Status: DC | PRN

## 2022-05-25 MED ORDER — AZITHROMYCIN 500 MG PO TABS: 1000.0000 mg | ORAL_TABLET | Freq: Once | ORAL | 0 refills | Status: AC

## 2022-05-25 NOTE — Telephone Encounter (Signed)
noted 

## 2022-05-26 ENCOUNTER — Telehealth: Payer: Self-pay

## 2022-05-26 LAB — CERVICOVAGINAL ANCILLARY ONLY
Bacterial Vaginitis (gardnerella): NEGATIVE
Chlamydia: POSITIVE — AB
Comment: NEGATIVE
Comment: NEGATIVE
Comment: NEGATIVE
Comment: NORMAL
Neisseria Gonorrhea: NEGATIVE
Trichomonas: NEGATIVE

## 2022-05-26 NOTE — Telephone Encounter (Signed)
Patient called to see if her HCG levels were taken at her last visit here. I informed patient that we only take HCG levels when there is sign of a miscarriage. As long as she wasn't bleeding or cramping we would not be checking her levels. She verbalized understanding.

## 2022-05-27 LAB — CYTOLOGY - PAP
Adequacy: ABSENT
Diagnosis: NEGATIVE

## 2022-06-22 ENCOUNTER — Encounter: Payer: Self-pay | Admitting: Advanced Practice Midwife

## 2022-06-22 ENCOUNTER — Other Ambulatory Visit (HOSPITAL_COMMUNITY)
Admission: RE | Admit: 2022-06-22 | Discharge: 2022-06-22 | Disposition: A | Discharge status: Home / Self Care | Payer: Medicaid Other | Source: Ambulatory Visit | Attending: Advanced Practice Midwife | Admitting: Advanced Practice Midwife

## 2022-06-22 ENCOUNTER — Ambulatory Visit (INDEPENDENT_AMBULATORY_CARE_PROVIDER_SITE_OTHER): Payer: Medicaid Other | Admitting: Advanced Practice Midwife

## 2022-06-22 VITALS — BP 126/74 | HR 84 | Wt 141.2 lb

## 2022-06-22 DIAGNOSIS — Z3480 Encounter for supervision of other normal pregnancy, unspecified trimester: Secondary | ICD-10-CM

## 2022-06-22 DIAGNOSIS — Z3482 Encounter for supervision of other normal pregnancy, second trimester: Secondary | ICD-10-CM

## 2022-06-22 DIAGNOSIS — Z3A15 15 weeks gestation of pregnancy: Secondary | ICD-10-CM | POA: Diagnosis present

## 2022-06-22 DIAGNOSIS — Z369 Encounter for antenatal screening, unspecified: Secondary | ICD-10-CM

## 2022-06-22 DIAGNOSIS — Z113 Encounter for screening for infections with a predominantly sexual mode of transmission: Secondary | ICD-10-CM | POA: Insufficient documentation

## 2022-06-22 LAB — POCT URINALYSIS DIPSTICK OB
Bilirubin, UA: NEGATIVE
Blood, UA: NEGATIVE
Glucose, UA: NEGATIVE
Ketones, UA: NEGATIVE
Leukocytes, UA: NEGATIVE
Nitrite, UA: NEGATIVE
Spec Grav, UA: 1.025 (ref 1.010–1.025)
Urobilinogen, UA: 0.2 E.U./dL
pH, UA: 6.5 (ref 5.0–8.0)

## 2022-06-22 NOTE — Progress Notes (Signed)
Routine Prenatal Care Visit  Subjective  Jessica Mitchell is a 32 y.o. [phone] at [redacted]w[redacted]d being seen today for ongoing prenatal care.  She is currently monitored for the following issues for this low-risk pregnancy and has Depression; Anxiety; History of cesarean delivery; History of abnormal cervical Pap smear; Supervision of other normal pregnancy, antepartum; Marijuana user; Rh negative state in antepartum period; and Chlamydia trachomatis infection in pregnancy in first trimester on their problem list.  ----------------------------------------------------------------------------------- Patient reports  some pressure with urination- no burning. She is anxious to get the results of her CT TOC. She can't return to work until she tests negative .   Contractions: Not present. Vag. Bleeding: None.  Movement: Absent. Leaking Fluid denies.  ----------------------------------------------------------------------------------- The following portions of the patient's history were reviewed and updated as appropriate: allergies, current medications, past family history, past medical history, past social history, past surgical history and problem list. Problem list updated.  Objective  Blood pressure 126/74, pulse 84, weight 141 lb 3.2 oz (64 kg), last menstrual period 02/28/2022, currently breastfeeding. Pregravid weight 132 lb (59.9 kg) Total Weight Gain 9 lb 3.2 oz (4.173 kg) Urinalysis: Urine Protein    Urine Glucose   Small amount of protein in UA  Fetal Status: Fetal Heart Rate (bpm): 153   Movement: Absent     General:  Alert, oriented and cooperative. Patient is in no acute distress.  Skin: Skin is warm and dry. No rash noted.   Cardiovascular: Normal heart rate noted  Respiratory: Normal respiratory effort, no problems with respiration noted  Abdomen: Soft, gravid, appropriate for gestational age. Pain/Pressure: Absent     Pelvic:   Aptima swab done         Extremities: Normal range of motion.   Edema: None  Mental Status: Normal mood and affect. Normal behavior. Normal judgment and thought content.   Assessment   32 y.o. [id number] at [redacted]w[redacted]d by  12/14/2022, by Ultrasound presenting for routine prenatal visit  Plan   FOURTH Problems (from 04/28/22 to present)    Nursing Staff Provider  Office Location  [facility]    Language  English Anatomy US    Flu Vaccine   Genetic Screen  NIPS:   TDaP vaccine    Hgb A1C or  GTT Early : Third trimester :   Covid    LAB RESULTS   Rhogam   Blood Type O/Negative/-- (03/26 1030)   Feeding Plan Breast Antibody Negative (03/26 1030)  Contraception  Rubella 2.97 (03/26 1030)  Circumcision  RPR Non Reactive (03/26 1030)   Pediatrician   HBsAg Negative (03/26 1030)   Support Person [place] HIV Non Reactive (03/26 1030)  Prenatal Classes  Varicella     GBS  (For PCN allergy, check sensitivities)   BTL Consent  GC/CT +CT in 1st trimester  VBAC Consent NA Hx 4 c/s Pap      Hgb Electro    Pelvis Tested NA CF      SMA           Preterm labor symptoms and general obstetric precautions including but not limited to vaginal bleeding, contractions, leaking of fluid and fetal movement were reviewed in detail with the patient. Please refer to After Visit Summary for other counseling recommendations.   Urine culture ordered  Return in about 4 weeks (around 07/20/2022) for anatomy scan and rob after.  Tresea Mall, CNM 06/22/2022 9:38 AM

## 2022-06-23 LAB — CERVICOVAGINAL ANCILLARY ONLY
Chlamydia: NEGATIVE
Comment: NEGATIVE
Comment: NORMAL
Neisseria Gonorrhea: NEGATIVE

## 2022-06-24 LAB — URINE CULTURE

## 2022-07-01 ENCOUNTER — Encounter: Payer: Medicaid Other | Admitting: Advanced Practice Midwife

## 2022-07-21 ENCOUNTER — Ambulatory Visit (INDEPENDENT_AMBULATORY_CARE_PROVIDER_SITE_OTHER): Payer: Medicaid Other

## 2022-07-21 ENCOUNTER — Encounter: Payer: Self-pay | Admitting: Obstetrics and Gynecology

## 2022-07-21 ENCOUNTER — Ambulatory Visit (INDEPENDENT_AMBULATORY_CARE_PROVIDER_SITE_OTHER): Payer: Medicaid Other | Admitting: Obstetrics and Gynecology

## 2022-07-21 VITALS — BP 129/78 | HR 67 | Wt 139.8 lb

## 2022-07-21 DIAGNOSIS — Z3482 Encounter for supervision of other normal pregnancy, second trimester: Secondary | ICD-10-CM

## 2022-07-21 DIAGNOSIS — Z3480 Encounter for supervision of other normal pregnancy, unspecified trimester: Secondary | ICD-10-CM

## 2022-07-21 DIAGNOSIS — Z3A19 19 weeks gestation of pregnancy: Secondary | ICD-10-CM | POA: Diagnosis not present

## 2022-07-21 DIAGNOSIS — Z1379 Encounter for other screening for genetic and chromosomal anomalies: Secondary | ICD-10-CM

## 2022-07-21 DIAGNOSIS — Z369 Encounter for antenatal screening, unspecified: Secondary | ICD-10-CM

## 2022-07-21 LAB — POCT URINALYSIS DIPSTICK OB
Bilirubin, UA: NEGATIVE
Blood, UA: NEGATIVE
Glucose, UA: NEGATIVE
Ketones, UA: NEGATIVE
Leukocytes, UA: NEGATIVE
Nitrite, UA: NEGATIVE
Spec Grav, UA: 1.025 (ref 1.010–1.025)
Urobilinogen, UA: 0.2 E.U./dL
pH, UA: 5 (ref 5.0–8.0)

## 2022-07-21 NOTE — Progress Notes (Signed)
[name]. Patient states daily fetal movement with occasional hip pain. She completed her anatomy ultrasound today. AFP today.

## 2022-07-21 NOTE — Progress Notes (Signed)
[name]: Anatomy scan complete today.  Patient reports she is doing well with active fetal movement.  She does have some hip pain and a little bit more comfortable but she says she has a belly band from a previous pregnancy that seem to help a lot.  She plans to use this.  She has reaffirmed her desire for repeat cesarean delivery with permanent sterilization.  Desires AFP today for spina bifida.  Timing of repeat cesarean delivery discussed.

## 2022-07-23 LAB — AFP, SERUM, OPEN SPINA BIFIDA
AFP MoM: 1
AFP Value: 58.4 ng/mL
Gest. Age on Collection Date: 19 weeks
Maternal Age At EDD: 33.8 yr
OSBR Risk 1 IN: 10000
Test Results:: NEGATIVE
Weight: 140 [lb_av]

## 2022-08-20 ENCOUNTER — Encounter: Payer: Self-pay | Admitting: Certified Nurse Midwife

## 2022-08-20 ENCOUNTER — Ambulatory Visit (INDEPENDENT_AMBULATORY_CARE_PROVIDER_SITE_OTHER): Payer: Medicaid Other | Admitting: Certified Nurse Midwife

## 2022-08-20 VITALS — BP 100/70 | Wt 143.0 lb

## 2022-08-20 DIAGNOSIS — Z131 Encounter for screening for diabetes mellitus: Secondary | ICD-10-CM

## 2022-08-20 DIAGNOSIS — Z114 Encounter for screening for human immunodeficiency virus [HIV]: Secondary | ICD-10-CM

## 2022-08-20 DIAGNOSIS — Z3A23 23 weeks gestation of pregnancy: Secondary | ICD-10-CM

## 2022-08-20 DIAGNOSIS — Z98891 History of uterine scar from previous surgery: Secondary | ICD-10-CM

## 2022-08-20 DIAGNOSIS — O34219 Maternal care for unspecified type scar from previous cesarean delivery: Secondary | ICD-10-CM

## 2022-08-20 DIAGNOSIS — Z369 Encounter for antenatal screening, unspecified: Secondary | ICD-10-CM

## 2022-08-20 DIAGNOSIS — Z113 Encounter for screening for infections with a predominantly sexual mode of transmission: Secondary | ICD-10-CM

## 2022-08-20 DIAGNOSIS — O26899 Other specified pregnancy related conditions, unspecified trimester: Secondary | ICD-10-CM

## 2022-08-20 DIAGNOSIS — O360130 Maternal care for anti-D [Rh] antibodies, third trimester, not applicable or unspecified: Secondary | ICD-10-CM

## 2022-08-20 DIAGNOSIS — Z3482 Encounter for supervision of other normal pregnancy, second trimester: Secondary | ICD-10-CM

## 2022-08-20 LAB — POCT URINALYSIS DIPSTICK OB
Bilirubin, UA: NEGATIVE
Blood, UA: NEGATIVE
Glucose, UA: NEGATIVE
Ketones, UA: NEGATIVE
Leukocytes, UA: NEGATIVE
Nitrite, UA: NEGATIVE
POC,PROTEIN,UA: NEGATIVE
Spec Grav, UA: 1.01 (ref 1.010–1.025)
Urobilinogen, UA: 1 E.U./dL
pH, UA: 6.5 (ref 5.0–8.0)

## 2022-08-20 NOTE — Patient Instructions (Signed)
Gestational Diabetes Mellitus, Diagnosis  Gestational diabetes mellitus, or gestational diabetes, is a form of diabetes that some women get during pregnancy. To control blood sugar (glucose) in the body, the pancreas makes a hormone called insulin. This hormone allows glucose to enter the cells in the body. The cells use glucose for energy. However, for some pregnant women, the pancreas may not make enough insulin or the body may not use available insulin properly. This leads to gestational diabetes. Gestational diabetes lasts only a short time. It usually happens at weeks 24-28 of pregnancy and goes away after delivery. However, women who get gestational diabetes are more likely to: Get it again if they become pregnant. Develop type 2 diabetes in the future. Gestational diabetes is not likely to cause problems if it is treated. If not controlled with treatment, it may cause problems during labor and delivery. Some problems can harm the baby and the mother. What are the causes? This condition occurs during pregnancy when the woman's body makes growth hormones that help the baby grow. Sometimes, these hormones: Cause the pancreas to work harder than normal to make more insulin. Sometimes, the pancreas still cannot make enough insulin. Cause insulin resistance. This makes it hard for the cells to use insulin properly. Insulin resistance or lack of insulin causes extra glucose to build up in the blood instead of going into cells. This leads to high blood glucose (hyperglycemia). What increases the risk? This condition is more likely to develop in pregnant women who: Are older than age 71 during pregnancy. Have a family history of diabetes. Are overweight. Have had gestational diabetes in the past. Have polycystic ovary syndrome (PCOS). Are pregnant with twins or other multiples. What are the signs or symptoms? Common symptoms of this condition include: Increased thirst. Increased hunger. Needing  to urinate more often. Most women do not notice these symptoms because the symptoms are similar to other symptoms of pregnancy. How is this diagnosed? This condition may be diagnosed based on your blood glucose level. This may be checked: After you fast for 8 hours or longer (fasting blood glucose test, or FBG). Any time of day, no matter when you eat (random glucose test). At weeks 24-28 of pregnancy, to check how your body responds to glucose (oral glucose tolerance test, or OGTT). If you have risk factors, you may be screened for type 2 diabetes at your first health care visit during your pregnancy. How is this treated? Treatment for this condition depends on the stage of your pregnancy and any other medical conditions you may have. Treatment may include: Eating a healthy diet and getting more physical activity. These are the most important ways to manage gestational diabetes. Checking your blood glucose as often as told. Taking insulin or other diabetes medicines every day. These medicines will be prescribed only if needed. You may need to work with a diabetes specialist, or endocrinologist, on a treatment plan. The goal of treatment is to have the right blood glucose levels during your pregnancy. The levels are checked while you are fasting and after you eat. Your health care provider will tell you what those levels are. Follow these instructions at home: Learn about your condition Learn as much as you can about your condition. Ask your health care provider: How often should I check my blood glucose, and where do I get the equipment? What diabetes medicines do I need, and when should I take them? Do I need to meet with a certified diabetes care and  education specialist? What number can I call if I have questions? Where can I find a support group for people with gestational diabetes? General instructions Take over-the-counter and prescription medicines only as told by your health care  provider. Manage your weight gain during pregnancy. Your expected weight gain depends on your BMI (body mass index) before pregnancy. Drink enough fluid to keep your urine pale yellow. Carry a medical alert card or wear medical alert jewelry that says you have gestational diabetes. Keep all follow-up visits. This is important. Where to find more information American Diabetes Association (ADA): diabetes.org Association of Diabetes Care & Education Specialists (ADCES): diabeteseducator.org Centers for Disease Control and Prevention (CDC): TonerPromos.no American Pregnancy Association: americanpregnancy.org U.S. Department of Agriculture MyPlate: WrestlingReporter.dk Contact a health care provider if you: Have a blood glucose level at or above: 240 mg/dL (16.1 mmol/L). 200 mg/dL (09.6 mmol/L), and you have ketones in your urine. This is a sign that your body is using fat for energy because it is not making enough insulin. Have a fever. Have been sick for 2 days or more and are not getting better. Have either of these problems for more than 6 hours: Vomiting every time you eat or drink. Diarrhea. Get help right away if you: Become confused or cannot think clearly. Have trouble breathing. Have moderate or high ketone levels in your urine. Feel that your baby is moving around less than usual. Develop unusual discharge or bleeding from your vagina. Start having early (premature) contractions. Contractions may feel like a tightening in your lower abdomen. Have a severe headache. These symptoms may represent a serious problem that is an emergency. Do not wait to see if the symptoms will go away. Get medical help right away. Call your local emergency services (911 in the [place] Do not drive yourself to the hospital. Summary Gestational diabetes mellitus is a form of diabetes that some women get during pregnancy. It usually happens at weeks 24-28 of pregnancy and goes away after delivery. Treatment may include  eating a healthy diet and getting more physical activity. You may also be given insulin or other diabetes medicines. Contact a health care provider if your glucose levels reach 240 mg/dL (04.5 mmol/L) or higher. Get help right away if you become confused or cannot think clearly, or you feel that your baby is moving around less than usual. This information is not intended to replace advice given to you by your health care provider. Make sure you discuss any questions you have with your health care provider. Document Revised: 06/04/2021 Document Reviewed: 07/16/2019 Elsevier Patient Education  2024 [employer].

## 2022-08-20 NOTE — Progress Notes (Signed)
   PRENATAL VISIT NOTE  Subjective:  Jessica Mitchell is a 32 y.o. [phone] at [redacted]w[redacted]d being seen today for ongoing prenatal care.  She is currently monitored for the following issues for this high-risk pregnancy and has Depression; Anxiety; History of cesarean delivery; History of abnormal cervical Pap smear; Supervision of other normal pregnancy, antepartum; Marijuana user; Rh negative state in antepartum period; and Chlamydia trachomatis infection in pregnancy in first trimester on their problem list.  Patient reports  two days of Deberah Pelton last week that were very uncomfortable, continues to have some BH contractions but not as significant as those two days .  Contractions: Irregular. Vag. Bleeding: None.  Movement: Present. Denies leaking of fluid.   The following portions of the patient's history were reviewed and updated as appropriate: allergies, current medications, past family history, past medical history, past social history, past surgical history and problem list.   Objective:   Vitals:   08/20/22 0814  BP: 100/70  Weight: 143 lb (64.9 kg)   Total weight gain: 11 lb (4.99 kg) Fetal Status: Fetal Heart Rate (bpm): 148 Fundal Height: 23 cm Movement: Present      General:  Alert, oriented and cooperative. Patient is in no acute distress.  Skin: Skin is warm and dry. No rash noted.   Cardiovascular: Normal heart rate noted  Respiratory: Normal respiratory effort, no problems with respiration noted  Abdomen: Soft, gravid, appropriate for gestational age.  Pain/Pressure: Present     Pelvic: Cervical exam deferred        Extremities: Normal range of motion.  Edema: None  Mental Status: Normal mood and affect. Normal behavior. Normal judgment and thought content.   Assessment and Plan:  Pregnancy: [id number] at [redacted]w[redacted]d 1. History of cesarean delivery  2. [redacted] weeks gestation of pregnancy - POC Urinalysis Dipstick OB  3. Encounter for supervision of other normal pregnancy in  second trimester - POC Urinalysis Dipstick OB - 28 Weeks RH-Panel; Future  4. Antenatal screening encounter - 28 Weeks RH-Panel; Future  5. Screening for diabetes mellitus - 28 Weeks RH-Panel; Future  6. Screening examination for venereal disease - 28 Weeks RH-Panel; Future  7. Screening for human immunodeficiency virus - 28 Weeks RH-Panel; Future  8. Rh negative, antepartum - 28 Weeks RH-Panel; Future   Preterm labor symptoms and general obstetric precautions including but not limited to vaginal bleeding, contractions, leaking of fluid and fetal movement were reviewed in detail with the patient. Comfort measures for BH contractions, pelvic pain/discomfort reviewed-consider SI belt like [place].  GDM & rhogam next visit. Please refer to After Visit Summary for other counseling recommendations.   Return in 4 weeks (on 09/17/2022) for [name] & GDM screening.  Future Appointments  Date Time Provider Department Center  09/16/2022  8:15 AM Glenetta Borg, CNM [facility] None    Dominica Severin, CNM

## 2022-09-16 ENCOUNTER — Encounter: Payer: Medicaid Other | Admitting: Obstetrics

## 2022-09-22 ENCOUNTER — Other Ambulatory Visit: Payer: Medicaid Other

## 2022-09-22 ENCOUNTER — Ambulatory Visit (INDEPENDENT_AMBULATORY_CARE_PROVIDER_SITE_OTHER): Payer: Medicaid Other | Admitting: Licensed Practical Nurse

## 2022-09-22 ENCOUNTER — Encounter: Payer: Self-pay | Admitting: Licensed Practical Nurse

## 2022-09-22 VITALS — BP 114/76 | HR 78 | Wt 152.1 lb

## 2022-09-22 DIAGNOSIS — Z369 Encounter for antenatal screening, unspecified: Secondary | ICD-10-CM

## 2022-09-22 DIAGNOSIS — O26899 Other specified pregnancy related conditions, unspecified trimester: Secondary | ICD-10-CM

## 2022-09-22 DIAGNOSIS — O360130 Maternal care for anti-D [Rh] antibodies, third trimester, not applicable or unspecified: Secondary | ICD-10-CM

## 2022-09-22 DIAGNOSIS — Z3A28 28 weeks gestation of pregnancy: Secondary | ICD-10-CM

## 2022-09-22 DIAGNOSIS — Z23 Encounter for immunization: Secondary | ICD-10-CM | POA: Diagnosis not present

## 2022-09-22 DIAGNOSIS — Z131 Encounter for screening for diabetes mellitus: Secondary | ICD-10-CM

## 2022-09-22 DIAGNOSIS — Z113 Encounter for screening for infections with a predominantly sexual mode of transmission: Secondary | ICD-10-CM

## 2022-09-22 DIAGNOSIS — Z3482 Encounter for supervision of other normal pregnancy, second trimester: Secondary | ICD-10-CM

## 2022-09-22 DIAGNOSIS — Z3480 Encounter for supervision of other normal pregnancy, unspecified trimester: Secondary | ICD-10-CM

## 2022-09-22 DIAGNOSIS — Z114 Encounter for screening for human immunodeficiency virus [HIV]: Secondary | ICD-10-CM

## 2022-09-22 DIAGNOSIS — O4100X0 Oligohydramnios, unspecified trimester, not applicable or unspecified: Secondary | ICD-10-CM

## 2022-09-22 LAB — POCT URINALYSIS DIPSTICK
Bilirubin, UA: NEGATIVE
Blood, UA: NEGATIVE
Glucose, UA: NEGATIVE
Ketones, UA: NEGATIVE
Leukocytes, UA: NEGATIVE
Nitrite, UA: NEGATIVE
Protein, UA: NEGATIVE
Spec Grav, UA: 1.015 (ref 1.010–1.025)
Urobilinogen, UA: 0.2 E.U./dL
pH, UA: 6.5 (ref 5.0–8.0)

## 2022-09-22 MED ORDER — RHO D IMMUNE GLOBULIN 1500 UNIT/2ML IJ SOSY
300.0000 ug | PREFILLED_SYRINGE | Freq: Once | INTRAMUSCULAR | Status: AC
  Administered 2022-09-22: 300 ug via INTRAMUSCULAR

## 2022-09-22 NOTE — Progress Notes (Signed)
Routine Prenatal Care Visit  Subjective  Jessica Mitchell is a 32 y.o. [phone] at [redacted]w[redacted]d being seen today for ongoing prenatal care.  She is currently monitored for the following issues for this high-risk pregnancy and has Depression; Anxiety; History of cesarean delivery; History of abnormal cervical Pap smear; Supervision of other normal pregnancy, antepartum; Marijuana user; Rh negative state in antepartum period; and Chlamydia trachomatis infection in pregnancy in first trimester on their problem list.  ----------------------------------------------------------------------------------- Patient reports  has leaked clear fluid, the first episode was 1 month ago. Now it seems to happen more frequently, she will wet her underwear. She was at the beach this weekend when it last happened. .  Last IC more than a month ago. AVW:UJWJXB pink, no lesions, neg pooling, ph change or ferning.  -pt feels reassured that her water is not broken. AFI subjectively low on anatomy US, repeat US ordered.   Contractions: Irritability. Vag. Bleeding: None.  Movement: Present. Leaking Fluid denies.  ----------------------------------------------------------------------------------- The following portions of the patient's history were reviewed and updated as appropriate: allergies, current medications, past family history, past medical history, past social history, past surgical history and problem list. Problem list updated.  Objective  Blood pressure 114/76, pulse 78, weight 152 lb 1.6 oz (69 kg), last menstrual period 02/28/2022, currently breastfeeding. Pregravid weight 132 lb (59.9 kg) Total Weight Gain 20 lb 1.6 oz (9.117 kg) Urinalysis: Urine Protein    Urine Glucose    Fetal Status: Fetal Heart Rate (bpm): 140 Fundal Height: 29 cm Movement: Present     General:  Alert, oriented and cooperative. Patient is in no acute distress.  Skin: Skin is warm and dry. No rash noted.   Cardiovascular: Normal heart rate  noted  Respiratory: Normal respiratory effort, no problems with respiration noted  Abdomen: Soft, gravid, appropriate for gestational age. Pain/Pressure: Present     Pelvic:  Cervical exam performed      see above note   Extremities: Normal range of motion.  Edema: None  Mental Status: Normal mood and affect. Normal behavior. Normal judgment and thought content.   Assessment   32 y.o. [id number] at [redacted]w[redacted]d by  12/14/2022, by Ultrasound presenting for routine prenatal visit  Plan   FOURTH Problems (from 04/28/22 to present)     Problem Noted Resolved   Supervision of other normal pregnancy, antepartum 04/15/2020 by Mirna Mires, CNM No   Overview Addendum 07/27/2022 10:46 PM by Tresea Mall, CNM     Nursing Staff Provider  Office Location  [facility]  By 10w u/s  Language  English Anatomy US  Complete/normal, placenta posterior  Flu Vaccine   Genetic Screen  NIPS:   TDaP vaccine    Hgb A1C or  GTT Early : Third trimester :   Covid    LAB RESULTS   Rhogam   Blood Type O/Negative/-- (03/26 1030)   Feeding Plan Breast Antibody Negative (03/26 1030)  Contraception Considering BTL- consent [ ]  Rubella 2.97 (03/26 1030)  Circumcision  RPR Non Reactive (03/26 1030)   Pediatrician   HBsAg Negative (03/26 1030)   Support Person Quinton HIV Non Reactive (03/26 1030)  Prenatal Classes  Varicella     GBS  (For PCN allergy, check sensitivities)   BTL Consent  GC/CT +CT in 1st trimester  VBAC Consent NA Hx 4 c/s Pap      Hgb Electro    Pelvis Tested NA CF      SMA  Preterm labor symptoms and general obstetric precautions including but not limited to vaginal bleeding, contractions, leaking of fluid and fetal movement were reviewed in detail with the patient. Please refer to After Visit Summary for other counseling recommendations.   Return in about 2 weeks (around 10/06/2022) for [name] with MD.  TDAP and Rhogam given today 28wk labs collected    Carie Caddy, [place]  [facility]  09/22/22  12:19 PM

## 2022-09-24 ENCOUNTER — Ambulatory Visit
Admission: RE | Admit: 2022-09-24 | Discharge: 2022-09-24 | Disposition: A | Discharge status: Home / Self Care | Payer: Medicaid Other | Source: Ambulatory Visit | Attending: Licensed Practical Nurse | Admitting: Licensed Practical Nurse

## 2022-09-24 DIAGNOSIS — Z3A28 28 weeks gestation of pregnancy: Secondary | ICD-10-CM | POA: Diagnosis not present

## 2022-09-24 DIAGNOSIS — O4103X0 Oligohydramnios, third trimester, not applicable or unspecified: Secondary | ICD-10-CM | POA: Diagnosis present

## 2022-09-24 DIAGNOSIS — Z3482 Encounter for supervision of other normal pregnancy, second trimester: Secondary | ICD-10-CM

## 2022-09-24 DIAGNOSIS — O4100X0 Oligohydramnios, unspecified trimester, not applicable or unspecified: Secondary | ICD-10-CM

## 2022-10-06 ENCOUNTER — Ambulatory Visit: Payer: Medicaid Other | Admitting: Obstetrics and Gynecology

## 2022-10-06 ENCOUNTER — Encounter: Payer: Self-pay | Admitting: Obstetrics and Gynecology

## 2022-10-06 VITALS — BP 113/64 | HR 72 | Wt 151.0 lb

## 2022-10-06 DIAGNOSIS — Z3A30 30 weeks gestation of pregnancy: Secondary | ICD-10-CM

## 2022-10-06 DIAGNOSIS — O26893 Other specified pregnancy related conditions, third trimester: Secondary | ICD-10-CM

## 2022-10-06 DIAGNOSIS — O0990 Supervision of high risk pregnancy, unspecified, unspecified trimester: Secondary | ICD-10-CM

## 2022-10-06 DIAGNOSIS — Z98891 History of uterine scar from previous surgery: Secondary | ICD-10-CM

## 2022-10-06 DIAGNOSIS — O0993 Supervision of high risk pregnancy, unspecified, third trimester: Secondary | ICD-10-CM

## 2022-10-06 DIAGNOSIS — Z6791 Unspecified blood type, Rh negative: Secondary | ICD-10-CM

## 2022-10-06 DIAGNOSIS — W1830XA Fall on same level, unspecified, initial encounter: Secondary | ICD-10-CM

## 2022-10-06 DIAGNOSIS — M25559 Pain in unspecified hip: Secondary | ICD-10-CM

## 2022-10-06 LAB — POCT URINALYSIS DIPSTICK OB
Bilirubin, UA: NEGATIVE
Blood, UA: NEGATIVE
Glucose, UA: NEGATIVE
Ketones, UA: NEGATIVE
Leukocytes, UA: NEGATIVE
Nitrite, UA: NEGATIVE
POC,PROTEIN,UA: NEGATIVE
Spec Grav, UA: 1.01 (ref 1.010–1.025)
Urobilinogen, UA: 0.2 E.U./dL
pH, UA: 5 (ref 5.0–8.0)

## 2022-10-06 NOTE — Progress Notes (Signed)
[name]: Patient is a 32 y.o. [id number] at [redacted]w[redacted]d who presents for routine OB care.  Pregnancy is complicated by: Depression; Anxiety; History of cesarean delivery; History of abnormal cervical Pap smear;  Marijuana user; Rh negative state in antepartum period; and Chlamydia trachomatis infection in pregnancy in first trimester. Patient has complaints of hip pain that is significant. Also notes ground level fall yesterday due to her hip pain and leg weakness.  Discussed pregnancy belly band, also will refer to PT. Can consider chiropractor if desired as well. Patient also for consultation for scheduling repeat C-section. Has h/o 3 prior C-section. Desires BTL as well.  Will schedule surgery for 12/07/2022.  Counseling noted below.  Lastly, patient has concerns for leaking fluid.  Notes that this has been going on for approximately 1 month.  Also has concerns that her amniotic fluid was noted to be lower than normal on a prior ultrasound, however most recently had another ultrasound last week to recheck AFI and fluid is noted to be normal at this time.  States that she is certain that it is not urine.  I discussed that if patient is concerned and has another episode, would recommend evaluation on labor and delivery as it is not impossible that a slow leak can occur with resealing of the membranes.  Patient notes understanding. RTC in 2 weeks.    C/S Counseling:  The risks of surgery were discussed with the patient including but were not limited to: bleeding which may require transfusion or reoperation; infection which may require antibiotics; injury to bowel, bladder, ureters or other surrounding organs; injury to the fetus; need for additional procedures including hysterectomy in the event of a life-threatening hemorrhage; formation of adhesions; placental abnormalities with subsequent pregnancies; incisional problems; thromboembolic phenomenon and other postoperative/anesthesia complications.     BTL Counseling:   Patient desires permanent sterilization.  Other reversible forms of contraception were discussed with patient; she declines all other modalities. Risks of procedure discussed with patient including but not limited to: risk of regret, permanence of method, bleeding, infection, injury to surrounding organs and need for additional procedures.  Failure risk of about 1% with increased risk of ectopic gestation if pregnancy occurs was also discussed with patient.  Also discussed possibility of post-tubal pain syndrome. Patient verbalized understanding of these risks and wants to proceed with sterilization.  Written informed consent obtained with signing of Medicaid Sterilization Form.  To OR when ready.

## 2022-10-06 NOTE — Progress Notes (Signed)
[name] [redacted]w[redacted]d: She is doing well. She continues to leak fluid on and off. She reports good fetal movement. She had her rhogam done at her previous visit.

## 2022-10-11 ENCOUNTER — Telehealth: Payer: Self-pay

## 2022-10-11 ENCOUNTER — Other Ambulatory Visit: Payer: Self-pay | Admitting: Certified Nurse Midwife

## 2022-10-11 MED ORDER — ACCRUFER 30 MG PO CAPS: 30.0000 mg | ORAL_CAPSULE | Freq: Two times a day (BID) | ORAL | 3 refills | Status: DC

## 2022-10-11 NOTE — Telephone Encounter (Signed)
Spoke with patient. She is hesitant to start iron because she has never had an issue with constipation except with her last pregnancy. Advised she can start taking a stool softener (colace). Patient request rx for Accrufer. Patient aware this rx usually requires a prior authorization and may take several days.

## 2022-10-11 NOTE — Telephone Encounter (Signed)
TRIAGE VOICEMAIL: Patient calling in regards to my chart message stating she needs to be on a iron pill. She has a few questions/concerns about that. [phone]

## 2022-10-20 ENCOUNTER — Ambulatory Visit: Payer: Medicaid Other | Admitting: Physical Therapy

## 2022-10-20 ENCOUNTER — Encounter: Payer: Self-pay | Admitting: Obstetrics

## 2022-10-20 ENCOUNTER — Ambulatory Visit (INDEPENDENT_AMBULATORY_CARE_PROVIDER_SITE_OTHER): Payer: Medicaid Other | Admitting: Obstetrics

## 2022-10-20 VITALS — BP 115/77 | HR 68 | Wt 155.0 lb

## 2022-10-20 DIAGNOSIS — Z3A32 32 weeks gestation of pregnancy: Secondary | ICD-10-CM

## 2022-10-20 DIAGNOSIS — Z98891 History of uterine scar from previous surgery: Secondary | ICD-10-CM

## 2022-10-20 DIAGNOSIS — O0990 Supervision of high risk pregnancy, unspecified, unspecified trimester: Secondary | ICD-10-CM

## 2022-10-20 DIAGNOSIS — O26899 Other specified pregnancy related conditions, unspecified trimester: Secondary | ICD-10-CM

## 2022-10-20 DIAGNOSIS — Z6791 Unspecified blood type, Rh negative: Secondary | ICD-10-CM

## 2022-10-20 NOTE — Progress Notes (Signed)
    Return Prenatal Note   Assessment/Plan   Plan  32 y.o. [id number] at [redacted]w[redacted]d presents for follow-up OB visit. Reviewed prenatal record including previous visit note.  History of 3 cesarean sections -CS scheduled for 11/07/22  -BTL consent signed with correct DOB today  Supervision of high risk pregnancy, antepartum -Pelvic/back pain with significant DR: has referral to PT and will schedule. Encouraged abdominal support belt, SI belt, avoiding heavy lifting. Work note given for light duty. -Reviewed s/s of PTL    No orders of the defined types were placed in this encounter.  Return in about 2 weeks (around 11/03/2022).   Future Appointments  Date Time Provider Department Center  11/03/2022  9:35 AM Hildred Laser, MD [facility] None  11/17/2022  9:35 AM Logan Bores Ellsworth Lennox, MD [facility] None    For next visit:  continue with routine prenatal care     Subjective  Jessica Mitchell has not been sleeping well at night d/t hip pain. She is feeling frequent BH but no regular contractions. 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 She is glad to have her CS scheduled but reports she has never made it to her scheduled CS date!  Movement: Present Contractions: Not present  Objective   Flow sheet Vitals: Pulse Rate: 68 BP: 115/77 Fundal Height: 32 cm Fetal Heart Rate (bpm): 145 Total weight gain: 23 lb (10.4 kg)  General Appearance  No acute distress, well appearing, and well nourished Pulmonary   Normal work of breathing Neurologic   Alert and oriented to person, place, and time Psychiatric   Mood and affect within normal limits Abdomen   Soft, non-tender, poor tone, 3FB DR  Jessica Mitchell, CNM 10/20/22 9:09 AM

## 2022-10-20 NOTE — Assessment & Plan Note (Signed)
-  CS scheduled for 11/07/22  -BTL consent signed with correct DOB today

## 2022-10-20 NOTE — Assessment & Plan Note (Addendum)
-  Pelvic/back pain with significant DR: has referral to PT and will schedule. Encouraged abdominal support belt, SI belt, avoiding heavy lifting. Work note given for light duty. -Reviewed s/s of PTL

## 2022-11-02 ENCOUNTER — Encounter: Payer: Medicaid Other | Admitting: Physical Therapy

## 2022-11-03 ENCOUNTER — Ambulatory Visit (INDEPENDENT_AMBULATORY_CARE_PROVIDER_SITE_OTHER): Payer: Medicaid Other | Admitting: Obstetrics and Gynecology

## 2022-11-03 ENCOUNTER — Encounter: Payer: Self-pay | Admitting: Obstetrics and Gynecology

## 2022-11-03 VITALS — BP 123/69 | HR 60 | Wt 159.7 lb

## 2022-11-03 DIAGNOSIS — O0990 Supervision of high risk pregnancy, unspecified, unspecified trimester: Secondary | ICD-10-CM

## 2022-11-03 DIAGNOSIS — O0993 Supervision of high risk pregnancy, unspecified, third trimester: Secondary | ICD-10-CM

## 2022-11-03 DIAGNOSIS — Z01818 Encounter for other preprocedural examination: Secondary | ICD-10-CM

## 2022-11-03 DIAGNOSIS — Z3A34 34 weeks gestation of pregnancy: Secondary | ICD-10-CM

## 2022-11-03 LAB — POCT URINALYSIS DIPSTICK OB
Bilirubin, UA: NEGATIVE
Glucose, UA: NEGATIVE
Ketones, UA: NEGATIVE
Leukocytes, UA: NEGATIVE
Nitrite, UA: NEGATIVE
Spec Grav, UA: 1.02 (ref 1.010–1.025)
Urobilinogen, UA: 0.2 U/dL
pH, UA: 6 (ref 5.0–8.0)

## 2022-11-03 NOTE — Progress Notes (Signed)
[name] [redacted]w[redacted]d: She is doing well. She has been having some pressure in her abdomen and her bottom. She thinks she lost part of her mucous plug yesterday.

## 2022-11-03 NOTE — Addendum Note (Signed)
Addended by: Fabian November on: 11/03/2022 12:25 PM   Modules accepted: Orders

## 2022-11-03 NOTE — Progress Notes (Signed)
[name]: Patient is a 32 y.o. [id number] at [redacted]w[redacted]d who presents for routine OB care.  Pregnancy is complicated by: has Depression; Anxiety; History of 3 cesarean sections; History of abnormal cervical Pap smear; Supervision of high risk pregnancy, antepartum; Marijuana user; Rh negative state in antepartum period; and Chlamydia trachomatis infection in pregnancy in first trimester.   Patient has complaints of losing some of her mucus plug.  Also has been having more intense [employer]. Still has concerns about delivering prior to her scheduled C-section date due to previous history of this happening.  Notes she does not have as much support this time as she did with her last pregnancy. States her job is also concerned about her as she works 20+ minutes from home. Advised that due to her job and limited family support, would recommend ceasing work after 36 weeks. Can bring FMLA paperwork at next visit. Speculum exam performed at patient's request due to fear of early dilation, cervix appears closed on exam, given reassurance. RTC in 2 weeks, for 36 week labs then.

## 2022-11-11 ENCOUNTER — Encounter: Payer: Medicaid Other | Admitting: Physical Therapy

## 2022-11-17 ENCOUNTER — Ambulatory Visit (INDEPENDENT_AMBULATORY_CARE_PROVIDER_SITE_OTHER): Payer: Medicaid Other | Admitting: Licensed Practical Nurse

## 2022-11-17 ENCOUNTER — Encounter: Payer: Self-pay | Admitting: Licensed Practical Nurse

## 2022-11-17 ENCOUNTER — Other Ambulatory Visit (HOSPITAL_COMMUNITY)
Admission: RE | Admit: 2022-11-17 | Discharge: 2022-11-17 | Disposition: A | Discharge status: Home / Self Care | Payer: Medicaid Other | Source: Ambulatory Visit | Attending: Obstetrics and Gynecology | Admitting: Obstetrics and Gynecology

## 2022-11-17 VITALS — BP 125/89 | HR 73 | Wt 162.2 lb

## 2022-11-17 DIAGNOSIS — Z23 Encounter for immunization: Secondary | ICD-10-CM

## 2022-11-17 DIAGNOSIS — Z113 Encounter for screening for infections with a predominantly sexual mode of transmission: Secondary | ICD-10-CM | POA: Diagnosis present

## 2022-11-17 DIAGNOSIS — Z3685 Encounter for antenatal screening for Streptococcus B: Secondary | ICD-10-CM

## 2022-11-17 DIAGNOSIS — Z3A36 36 weeks gestation of pregnancy: Secondary | ICD-10-CM

## 2022-11-17 DIAGNOSIS — Z2911 Encounter for prophylactic immunotherapy for respiratory syncytial virus (RSV): Secondary | ICD-10-CM

## 2022-11-17 DIAGNOSIS — O0993 Supervision of high risk pregnancy, unspecified, third trimester: Secondary | ICD-10-CM

## 2022-11-17 LAB — POCT URINALYSIS DIPSTICK OB
Bilirubin, UA: NEGATIVE
Blood, UA: NEGATIVE
Glucose, UA: NEGATIVE
Ketones, UA: NEGATIVE
Leukocytes, UA: NEGATIVE
Nitrite, UA: NEGATIVE
POC,PROTEIN,UA: NEGATIVE
Spec Grav, UA: 1.02 (ref 1.010–1.025)
Urobilinogen, UA: 0.2 E.U./dL
pH, UA: 7 (ref 5.0–8.0)

## 2022-11-17 NOTE — Progress Notes (Signed)
Routine Prenatal Care Visit  Subjective  Jessica Mitchell is a 32 y.o. [phone] at [redacted]w[redacted]d being seen today for ongoing prenatal care.  She is currently monitored for the following issues for this high-risk pregnancy and has Depression; Anxiety; History of 3 cesarean sections; History of abnormal cervical Pap smear; Supervision of high risk pregnancy, antepartum; Marijuana user; Rh negative state in antepartum period; and Chlamydia trachomatis infection in pregnancy in first trimester on their problem list.  ----------------------------------------------------------------------------------- Patient reports  pelvic pressure, not sleeping well d/t pain-tried 1 Tylenol PM which helped some. Continues to leak discharge, wakes up with a small amount on the sheets. .  Worries about not making it to her scheduled date, her SIL will watch her children.  -SSE cervix visually 1cm, thick, negative pooling, Nitrazine negative, and ferning not done-slide was thrown away prior to reviewing it, physiologic discharge present  -has been taking Iron for about 2 weeks, is not consistent  -has stopped working, getting ready for baby at home to pass her time    Contractions: Irritability. Vag. Bleeding: None.  Movement: Present. Leaking Fluid denies.  ----------------------------------------------------------------------------------- The following portions of the patient's history were reviewed and updated as appropriate: allergies, current medications, past family history, past medical history, past social history, past surgical history and problem list. Problem list updated.  Objective  Blood pressure 125/89, pulse 73, weight 162 lb 3.2 oz (73.6 kg), last menstrual period 02/28/2022, currently breastfeeding. Pregravid weight 132 lb (59.9 kg) Total Weight Gain 30 lb 3.2 oz (13.7 kg) Urinalysis: Urine Protein Negative  Urine Glucose Negative  Fetal Status: Fetal Heart Rate (bpm): 150 Fundal Height: 35 cm Movement:  Present     General:  Alert, oriented and cooperative. Patient is in no acute distress.  Skin: Skin is warm and dry. No rash noted.   Cardiovascular: Normal heart rate noted  Respiratory: Normal respiratory effort, no problems with respiration noted  Abdomen: Soft, gravid, appropriate for gestational age. Pain/Pressure: Present     Pelvic:   See above note          Extremities: Normal range of motion.  Edema: Trace  Mental Status: Normal mood and affect. Normal behavior. Normal judgment and thought content.   Assessment   32 y.o. [id number] at [redacted]w[redacted]d by  12/14/2022, by Ultrasound presenting for routine prenatal visit  Plan   FOURTH Problems (from 04/28/22 to present)     Problem Noted Resolved   Supervision of high risk pregnancy, antepartum 04/15/2020 by Mirna Mires, CNM No   Overview Addendum 11/03/2022  9:56 AM by Tommie Raymond, CMA     Nursing Staff Provider  Office Location  [facility]  By 10w u/s  Language  English Anatomy US  Complete/normal, placenta posterior  Flu Vaccine   Genetic Screen  NIPS: neg, female   TDaP vaccine   09/22/2022 Hgb A1C or  GTT Early : Third trimester : 38  Covid    LAB RESULTS   Rhogam  09/22/2022 Blood Type O/Negative/-- (03/26 1030)   Feeding Plan Breast Antibody Negative (03/26 1030)  Contraception Considering BTL- consent [ ]  Rubella 2.97 (03/26 1030)  Circumcision Desired RPR Non Reactive (03/26 1030)   Pediatrician  Gavin Potters ([place]) HBsAg Negative (03/26 1030)   Support Person Quinton HIV Non Reactive (03/26 1030)  Prenatal Classes  Varicella Immune    GBS  (For PCN allergy, check sensitivities)   BTL Consent  Done 10/06/22 GC/CT +CT in 1st trimester  VBAC Consent NA Hx  3 c/s Pap   Diagnosis  Date Value Ref Range Status  05/25/2022      - Negative for intraepithelial lesion or malignancy (NILM)      Hgb Electro    Pelvis Tested NA CF      SMA                      Preterm labor symptoms and general obstetric  precautions including but not limited to vaginal bleeding, contractions, leaking of fluid and fetal movement were reviewed in detail with the patient. Please refer to After Visit Summary for other counseling recommendations.   Return in about 1 week (around 11/24/2022) for [name].  RSV vaccine given  36 wk swabs collected CBC in 2 weeks  Carie Caddy, [place]   [facility]  11/17/22  12:22 PM

## 2022-11-18 LAB — CERVICOVAGINAL ANCILLARY ONLY
Chlamydia: NEGATIVE
Comment: NEGATIVE
Comment: NORMAL
Neisseria Gonorrhea: NEGATIVE

## 2022-11-19 ENCOUNTER — Encounter: Payer: Medicaid Other | Admitting: Physical Therapy

## 2022-11-19 LAB — STREP GP B NAA: Strep Gp B NAA: NEGATIVE

## 2022-11-24 ENCOUNTER — Encounter: Payer: Self-pay | Admitting: Obstetrics and Gynecology

## 2022-11-24 ENCOUNTER — Ambulatory Visit: Payer: Medicaid Other | Admitting: Obstetrics and Gynecology

## 2022-11-24 VITALS — BP 125/84 | HR 66 | Wt 164.7 lb

## 2022-11-24 DIAGNOSIS — Z23 Encounter for immunization: Secondary | ICD-10-CM

## 2022-11-24 DIAGNOSIS — Z3A37 37 weeks gestation of pregnancy: Secondary | ICD-10-CM

## 2022-11-24 DIAGNOSIS — O0993 Supervision of high risk pregnancy, unspecified, third trimester: Secondary | ICD-10-CM

## 2022-11-24 DIAGNOSIS — O0990 Supervision of high risk pregnancy, unspecified, unspecified trimester: Secondary | ICD-10-CM

## 2022-11-24 LAB — POCT URINALYSIS DIPSTICK OB
Bilirubin, UA: NEGATIVE
Blood, UA: NEGATIVE
Glucose, UA: NEGATIVE
Ketones, UA: NEGATIVE
Leukocytes, UA: NEGATIVE
Nitrite, UA: NEGATIVE
POC,PROTEIN,UA: NEGATIVE
Spec Grav, UA: 1.015 (ref 1.010–1.025)
Urobilinogen, UA: 0.2 U/dL
pH, UA: 6 (ref 5.0–8.0)

## 2022-11-24 NOTE — Progress Notes (Signed)
[name]. Patient states daily fetal movement and increased contractions. GBS neg. Flu shot administered. No additional questions or concerns at this time.

## 2022-11-24 NOTE — Progress Notes (Signed)
[name]: She reports she is doing well.  Daily fetal movement.  Has occasional contractions.  We have discussed her cesarean delivery.  She desires permanent sterilization with tubal ligation and would also like a Mirena IUD placed if possible.  She would like to alleviate menstrual periods and have good cycle control as well as the reassurance of tubal ligation and Mirena.  She will speak with Dr. Valentino Saxon regarding possible placement of Mirena during cesarean delivery.

## 2022-11-29 ENCOUNTER — Encounter
Admission: RE | Admit: 2022-11-29 | Discharge: 2022-11-29 | Disposition: A | Discharge status: Home / Self Care | Payer: Medicaid Other | Source: Ambulatory Visit | Attending: Obstetrics and Gynecology | Admitting: Obstetrics and Gynecology

## 2022-11-29 ENCOUNTER — Telehealth: Payer: Self-pay

## 2022-11-29 HISTORY — DX: Anemia, unspecified: D64.9

## 2022-11-29 HISTORY — DX: Other specified health status: Z78.9

## 2022-11-29 HISTORY — DX: Functional dyspepsia: K30

## 2022-11-29 HISTORY — DX: Cardiac arrhythmia, unspecified: I49.9

## 2022-11-29 HISTORY — DX: Alcohol use, unspecified, uncomplicated: F10.90

## 2022-11-29 HISTORY — DX: Personal history of nicotine dependence: Z87.891

## 2022-11-29 HISTORY — DX: Cannabis use, unspecified, in remission: F12.91

## 2022-11-29 NOTE — Telephone Encounter (Signed)
Pt is calling back

## 2022-11-29 NOTE — Telephone Encounter (Signed)
Spoke with patient. States she spoke with access nurse in detail and had already done what she knew to do based on previous pregnancies (eat/drink and pay attention to movement). She states fetal movement returned to normal within 2 hours. Access nurse advised her to go to L&D after inquiring about amniotic fluid and patient advised her at one time her AFI was low but that had resolved. Patient declined to go to L&D based on return of normal fetal movement. She states movement is good today.

## 2022-11-29 NOTE — Telephone Encounter (Addendum)
Telephone Advice Record received via fax from [facility]. Caller Chief Complaint: Jessica Mitchell is [redacted] weeks pregnant and having decreased fetal movement. States she moved once when having a bowel movement. Still decreased from normal Care Advice Given by Access Nurse per Guidline: Go to L&D NOW.

## 2022-11-29 NOTE — Patient Instructions (Addendum)
Your procedure is scheduled on:12-07-22 Tuesday  Arrival Time: Arrive@ 8:45 am. Report to the Registration Desk in the Medical Mall. Then proceed to the 3rd floor to Labor and Delivery. Please call Labor and Delivery if you have any questions [phone].  Arrival: If your arrival time is prior to 6:00 am, please enter through the Emergency Room Entrance and you will be directed to Labor and Delivery. If your arrival time is 6:00 am or later, please enter the Medical Mall and follow the greeter's instructions.  REMEMBER: Instructions that are not followed completely may result in serious medical risk, up to and including death; or upon the discretion of your surgeon and anesthesiologist your surgery may need to be rescheduled.  Do not eat food OR drink any liquids after midnight the night before surgery.  No gum chewing or hard candies.  One week prior to surgery: Stop Anti-inflammatories (NSAIDS) such as Advil, Aleve, Ibuprofen, Motrin, Naproxen, Naprosyn and Aspirin based products such as Excedrin, Goody's Powder, BC Powder. You may however, continue to take Tylenol if needed for pain up until the day of surgery.  Continue taking all prescribed medications  Do NOT take any medication the day of surgery  No Alcohol for 24 hours before or after surgery.  No Smoking including e-cigarettes for 24 hours prior to surgery.  No chewable tobacco products for at least 6 hours prior to surgery.  No nicotine patches on the day of surgery.  Do not use any "recreational" drugs for at least a week prior to your surgery.  Please be advised that the combination of cocaine and anesthesia may have negative outcomes, up to and including death. If you test positive for cocaine, your surgery will be cancelled.  On the morning of surgery brush your teeth with toothpaste and water, you may rinse your mouth with mouthwash if you wish. Do not swallow any toothpaste or mouthwash.  Use CHG soap as directed  on instruction sheet. (Avoid Nipple and Private Area)  Do not wear jewelry, make-up, hairpins, clips or nail polish.  For welded (permanent) jewelry: bracelets, anklets, waist bands, etc.  Please have this removed prior to surgery.  If it is not removed, there is a chance that hospital personnel will need to cut it off on the day of surgery.  Do not wear lotions, powders, or perfumes.   Do not shave body hair from the neck down 48 hours before surgery.  Contact lenses, hearing aids and dentures may not be worn into surgery.  Do not bring valuables to the hospital. [facility] is not responsible for any missing/lost belongings or valuables.   Notify your doctor if there is any change in your medical condition (cold, fever, infection).  Wear comfortable clothing (specific to your surgery type) to the hospital.  After surgery, you can help prevent lung complications by doing breathing exercises.  Take deep breaths and cough every 1-2 hours. Your doctor may order a device called an Incentive Spirometer to help you take deep breaths. When coughing or sneezing, hold a pillow firmly against your incision with both hands. This is called "splinting." Doing this helps protect your incision. It also decreases belly discomfort.  Please call the Pre-admissions Testing Dept. at [phone] if you have any questions about these instructions.  Surgery Visitation Policy:  Visitor Passes   All visitors, including children, need an identification sticker when visiting. These stickers must be worn where they can be seen.   Labor & Delivery  Laboring women  may have one designated support person and two other visitors of any age visit. The support person must remain the same. The visitors may switch with other visitors. Visitation is permitted 24 hours per day. The designated support person or a visitor over the age of 16 may sleep overnight in the patient's room. A doula registered with [facility] for labor and delivery support is not considered a visitor. Doulas not registered with [facility] are considered visitors.  Mother Baby Unit, OB Specialty and Gynecological Care  A designated support person and three visitors of any age may visit. The three visitors may switch out. The designated support person or a visitor age 28 or older may stay overnight in the room. During the postpartum period (up to 6 weeks), if the mother is the patient, she can have her newborn stay with her if there is another support person present who can be responsible for the baby.       Preparing for Surgery with CHLORHEXIDINE GLUCONATE (CHG) Soap  Chlorhexidine Gluconate (CHG) Soap  o An antiseptic cleaner that kills germs and bonds with the skin to continue killing germs even after washing  o Used for showering the night before surgery and morning of surgery  Before surgery, you can play an important role by reducing the number of germs on your skin.  CHG (Chlorhexidine gluconate) soap is an antiseptic cleanser which kills germs and bonds with the skin to continue killing germs even after washing.  Please do not use if you have an allergy to CHG or antibacterial soaps. If your skin becomes reddened/irritated stop using the CHG.  1. Shower the NIGHT BEFORE SURGERY and the MORNING OF SURGERY with CHG soap.  2. If you choose to wash your hair, wash your hair first as usual with your normal shampoo.  3. After shampooing, rinse your hair and body thoroughly to remove the shampoo.  4. Use CHG as you would any other liquid soap. You can apply CHG directly to the skin and wash gently with a scrungie or a clean washcloth.  5. Apply the CHG soap to your body only from the neck down. Do not use on open wounds or open sores. Avoid contact with your eyes, ears, mouth, and genitals (private parts). Wash face and genitals (private parts) with your normal soap.  6. Wash thoroughly, paying special attention  to the area where your surgery will be performed.  7. Thoroughly rinse your body with warm water.  8. Do not shower/wash with your normal soap after using and rinsing off the CHG soap.  9. Pat yourself dry with a clean towel.  10. Wear clean pajamas to bed the night before surgery.  12. Place clean sheets on your bed the night of your first shower and do not sleep with pets.  13. Shower again with the CHG soap on the day of surgery prior to arriving at the hospital.  14. Do not apply any deodorants/lotions/powders.  15. Please wear clean clothes to the hospital.

## 2022-11-29 NOTE — Telephone Encounter (Signed)
Chart reviewed. No records of patient being seen. Spoke w/patient. She is currently on the phone with Pre Admit testing. She advised she will call us back.

## 2022-12-01 ENCOUNTER — Ambulatory Visit (INDEPENDENT_AMBULATORY_CARE_PROVIDER_SITE_OTHER): Payer: Medicaid Other | Admitting: Obstetrics and Gynecology

## 2022-12-01 ENCOUNTER — Encounter: Payer: Self-pay | Admitting: Obstetrics and Gynecology

## 2022-12-01 VITALS — BP 137/83 | HR 64 | Wt 160.8 lb

## 2022-12-01 DIAGNOSIS — Z0289 Encounter for other administrative examinations: Secondary | ICD-10-CM

## 2022-12-01 DIAGNOSIS — Z3A38 38 weeks gestation of pregnancy: Secondary | ICD-10-CM

## 2022-12-01 DIAGNOSIS — O0993 Supervision of high risk pregnancy, unspecified, third trimester: Secondary | ICD-10-CM

## 2022-12-01 DIAGNOSIS — Z98891 History of uterine scar from previous surgery: Secondary | ICD-10-CM

## 2022-12-01 DIAGNOSIS — F5089 Other specified eating disorder: Secondary | ICD-10-CM

## 2022-12-01 DIAGNOSIS — Z3009 Encounter for other general counseling and advice on contraception: Secondary | ICD-10-CM

## 2022-12-01 DIAGNOSIS — O34219 Maternal care for unspecified type scar from previous cesarean delivery: Secondary | ICD-10-CM

## 2022-12-01 LAB — POCT URINALYSIS DIPSTICK OB
Bilirubin, UA: NEGATIVE
Glucose, UA: NEGATIVE
Ketones, UA: NEGATIVE
Leukocytes, UA: NEGATIVE
Nitrite, UA: NEGATIVE
Spec Grav, UA: 1.015 (ref 1.010–1.025)
Urobilinogen, UA: 0.2 U/dL
pH, UA: 6.5 (ref 5.0–8.0)

## 2022-12-01 LAB — POCT HEMOGLOBIN: Hemoglobin: 10.5 g/dL — AB (ref 11–14.6)

## 2022-12-01 NOTE — Progress Notes (Signed)
[name] [redacted]w[redacted]d: She is doing well. She reports decreased fetal movement. She has had some nausea and diarrhea.

## 2022-12-01 NOTE — Progress Notes (Signed)
[name]: Patient is a 32 y.o. Z6X0960 at [redacted]w[redacted]d who presents for routine OB care.  Pregnancy is complicated by: Depression; Anxiety; History of 3 cesarean sections; History of abnormal cervical Pap smear; Supervision of high risk pregnancy, antepartum; Marijuana user; Rh negative state in antepartum period; and Chlamydia trachomatis infection in pregnancy in first trimester.   Patient has complaints of back pain/sciatic pain. Discussed comfort/treatment measures. Notes decreased fetal movement over the weekend on Saturday, called triage and tried tricks and worked, Has felt better movement for the past 2 days. Also feels sluggish. Has h/o anemia, eating more ice. Notes not always being compliant with irion because she forgets. Checked POCT Hgb, was 10.5 (essentially unchanged since 28 weeks).  Discussed upcoming C-section next week, answered all questions. Given labor precautions.

## 2022-12-03 ENCOUNTER — Inpatient Hospital Stay: Payer: Medicaid Other | Admitting: Urgent Care

## 2022-12-03 ENCOUNTER — Encounter: Payer: Self-pay | Admitting: Obstetrics

## 2022-12-03 ENCOUNTER — Encounter
Admission: EM | Disposition: A | Discharge status: Home / Self Care | Payer: Self-pay | Source: Home / Self Care | Attending: Obstetrics

## 2022-12-03 ENCOUNTER — Other Ambulatory Visit: Payer: Self-pay

## 2022-12-03 ENCOUNTER — Inpatient Hospital Stay
Admission: EM | Admit: 2022-12-03 | Discharge: 2022-12-06 | DRG: 784 | Disposition: A | Discharge status: Home / Self Care | Payer: Medicaid Other | Attending: Obstetrics | Admitting: Obstetrics

## 2022-12-03 ENCOUNTER — Inpatient Hospital Stay: Payer: Medicaid Other | Admitting: Registered Nurse

## 2022-12-03 ENCOUNTER — Other Ambulatory Visit: Payer: Self-pay | Admitting: Obstetrics

## 2022-12-03 DIAGNOSIS — Z6791 Unspecified blood type, Rh negative: Secondary | ICD-10-CM | POA: Diagnosis not present

## 2022-12-03 DIAGNOSIS — O34211 Maternal care for low transverse scar from previous cesarean delivery: Secondary | ICD-10-CM | POA: Diagnosis present

## 2022-12-03 DIAGNOSIS — O9081 Anemia of the puerperium: Secondary | ICD-10-CM | POA: Diagnosis not present

## 2022-12-03 DIAGNOSIS — Z801 Family history of malignant neoplasm of trachea, bronchus and lung: Secondary | ICD-10-CM | POA: Diagnosis not present

## 2022-12-03 DIAGNOSIS — F1290 Cannabis use, unspecified, uncomplicated: Secondary | ICD-10-CM | POA: Diagnosis not present

## 2022-12-03 DIAGNOSIS — Z30430 Encounter for insertion of intrauterine contraceptive device: Secondary | ICD-10-CM

## 2022-12-03 DIAGNOSIS — Z3A38 38 weeks gestation of pregnancy: Secondary | ICD-10-CM

## 2022-12-03 DIAGNOSIS — D62 Acute posthemorrhagic anemia: Secondary | ICD-10-CM | POA: Diagnosis not present

## 2022-12-03 DIAGNOSIS — Z825 Family history of asthma and other chronic lower respiratory diseases: Secondary | ICD-10-CM

## 2022-12-03 DIAGNOSIS — Z8249 Family history of ischemic heart disease and other diseases of the circulatory system: Secondary | ICD-10-CM

## 2022-12-03 DIAGNOSIS — Z833 Family history of diabetes mellitus: Secondary | ICD-10-CM

## 2022-12-03 DIAGNOSIS — O360130 Maternal care for anti-D [Rh] antibodies, third trimester, not applicable or unspecified: Secondary | ICD-10-CM | POA: Diagnosis not present

## 2022-12-03 DIAGNOSIS — O34219 Maternal care for unspecified type scar from previous cesarean delivery: Secondary | ICD-10-CM

## 2022-12-03 DIAGNOSIS — F419 Anxiety disorder, unspecified: Secondary | ICD-10-CM | POA: Diagnosis present

## 2022-12-03 DIAGNOSIS — Z302 Encounter for sterilization: Secondary | ICD-10-CM

## 2022-12-03 DIAGNOSIS — O99344 Other mental disorders complicating childbirth: Secondary | ICD-10-CM

## 2022-12-03 DIAGNOSIS — Z823 Family history of stroke: Secondary | ICD-10-CM | POA: Diagnosis not present

## 2022-12-03 DIAGNOSIS — F32A Depression, unspecified: Secondary | ICD-10-CM | POA: Diagnosis not present

## 2022-12-03 DIAGNOSIS — O99324 Drug use complicating childbirth: Secondary | ICD-10-CM

## 2022-12-03 DIAGNOSIS — Z87891 Personal history of nicotine dependence: Secondary | ICD-10-CM | POA: Diagnosis not present

## 2022-12-03 DIAGNOSIS — O0990 Supervision of high risk pregnancy, unspecified, unspecified trimester: Principal | ICD-10-CM

## 2022-12-03 DIAGNOSIS — O26893 Other specified pregnancy related conditions, third trimester: Secondary | ICD-10-CM | POA: Diagnosis present

## 2022-12-03 DIAGNOSIS — Z23 Encounter for immunization: Secondary | ICD-10-CM | POA: Diagnosis not present

## 2022-12-03 DIAGNOSIS — Z9851 Tubal ligation status: Secondary | ICD-10-CM

## 2022-12-03 DIAGNOSIS — Z98891 History of uterine scar from previous surgery: Secondary | ICD-10-CM

## 2022-12-03 LAB — TYPE AND SCREEN
ABO/RH(D): O NEG
Antibody Screen: POSITIVE

## 2022-12-03 LAB — CBC
HCT: 35 % — ABNORMAL LOW (ref 36.0–46.0)
Hemoglobin: 11 g/dL — ABNORMAL LOW (ref 12.0–15.0)
MCH: 24.3 pg — ABNORMAL LOW (ref 26.0–34.0)
MCHC: 31.4 g/dL (ref 30.0–36.0)
MCV: 77.3 fL — ABNORMAL LOW (ref 80.0–100.0)
Platelets: 181 10*3/uL (ref 150–400)
RBC: 4.53 MIL/uL (ref 3.87–5.11)
RDW: 17 % — ABNORMAL HIGH (ref 11.5–15.5)
WBC: 12.3 10*3/uL — ABNORMAL HIGH (ref 4.0–10.5)
nRBC: 0.2 % (ref 0.0–0.2)

## 2022-12-03 SURGERY — Surgical Case
Anesthesia: Spinal

## 2022-12-03 MED ORDER — MEPERIDINE HCL 25 MG/ML IJ SOLN: 6.2500 mg | INTRAMUSCULAR | Status: DC | PRN

## 2022-12-03 MED ORDER — DEXAMETHASONE SODIUM PHOSPHATE 10 MG/ML IJ SOLN
INTRAMUSCULAR | Status: AC
  Filled 2022-12-03: qty 1

## 2022-12-03 MED ORDER — DIPHENHYDRAMINE HCL 50 MG/ML IJ SOLN: 12.5000 mg | INTRAMUSCULAR | Status: DC | PRN

## 2022-12-03 MED ORDER — LEVONORGESTREL 20 MCG/DAY IU IUD
1.0000 | INTRAUTERINE_SYSTEM | Freq: Once | INTRAUTERINE | Status: AC
  Administered 2022-12-03: 1 via INTRAUTERINE
  Filled 2022-12-03: qty 1

## 2022-12-03 MED ORDER — ACETAMINOPHEN 500 MG PO TABS
1000.0000 mg | ORAL_TABLET | Freq: Four times a day (QID) | ORAL | Status: DC
  Administered 2022-12-03 – 2022-12-06 (×10): 1000 mg via ORAL
  Filled 2022-12-03 (×12): qty 2

## 2022-12-03 MED ORDER — COCONUT OIL OIL: 1.0000 | TOPICAL_OIL | Status: DC | PRN

## 2022-12-03 MED ORDER — SODIUM CHLORIDE 0.9 % IV SOLN: 8.0000 mg | Freq: Once | INTRAVENOUS | Status: DC

## 2022-12-03 MED ORDER — PHENYLEPHRINE HCL-NACL 20-0.9 MG/250ML-% IV SOLN
INTRAVENOUS | Status: DC | PRN
  Administered 2022-12-03: 46.667 ug/min via INTRAVENOUS

## 2022-12-03 MED ORDER — OXYTOCIN-SODIUM CHLORIDE 30-0.9 UT/500ML-% IV SOLN
INTRAVENOUS | Status: AC
  Administered 2022-12-03: 41.7 mL
  Filled 2022-12-03: qty 500

## 2022-12-03 MED ORDER — SODIUM CHLORIDE 0.9% FLUSH: 3.0000 mL | INTRAVENOUS | Status: DC | PRN

## 2022-12-03 MED ORDER — CALCIUM CARBONATE ANTACID 500 MG PO CHEW: 400.0000 mg | CHEWABLE_TABLET | Freq: Three times a day (TID) | ORAL | Status: DC | PRN

## 2022-12-03 MED ORDER — WITCH HAZEL-GLYCERIN EX PADS: 1.0000 | MEDICATED_PAD | CUTANEOUS | Status: DC | PRN

## 2022-12-03 MED ORDER — FENTANYL CITRATE (PF) 100 MCG/2ML IJ SOLN
INTRAMUSCULAR | Status: AC
  Filled 2022-12-03: qty 2

## 2022-12-03 MED ORDER — PRENATAL MULTIVITAMIN CH
1.0000 | ORAL_TABLET | Freq: Every day | ORAL | Status: DC
  Administered 2022-12-04 – 2022-12-06 (×3): 1 via ORAL
  Filled 2022-12-03 (×3): qty 1

## 2022-12-03 MED ORDER — NALOXONE HCL 4 MG/10ML IJ SOLN: 1.0000 ug/kg/h | INTRAVENOUS | Status: DC | PRN

## 2022-12-03 MED ORDER — OXYTOCIN-SODIUM CHLORIDE 30-0.9 UT/500ML-% IV SOLN
2.5000 [IU]/h | INTRAVENOUS | Status: DC
  Administered 2022-12-03: 2.5 [IU]/h via INTRAVENOUS
  Filled 2022-12-03: qty 500

## 2022-12-03 MED ORDER — MORPHINE SULFATE (PF) 0.5 MG/ML IJ SOLN
INTRAMUSCULAR | Status: AC
  Filled 2022-12-03: qty 10

## 2022-12-03 MED ORDER — DIBUCAINE (PERIANAL) 1 % EX OINT: 1.0000 | TOPICAL_OINTMENT | CUTANEOUS | Status: DC | PRN

## 2022-12-03 MED ORDER — KETOROLAC TROMETHAMINE 30 MG/ML IJ SOLN
INTRAMUSCULAR | Status: DC | PRN
  Administered 2022-12-03: 30 mg via INTRAVENOUS

## 2022-12-03 MED ORDER — SIMETHICONE 80 MG PO CHEW
80.0000 mg | CHEWABLE_TABLET | ORAL | Status: DC | PRN
  Administered 2022-12-04 – 2022-12-05 (×2): 80 mg via ORAL
  Filled 2022-12-03 (×2): qty 1

## 2022-12-03 MED ORDER — FENTANYL CITRATE (PF) 100 MCG/2ML IJ SOLN: 25.0000 ug | INTRAMUSCULAR | Status: DC | PRN

## 2022-12-03 MED ORDER — 0.9 % SODIUM CHLORIDE (POUR BTL) OPTIME
TOPICAL | Status: DC | PRN
  Administered 2022-12-03: 1000 mL

## 2022-12-03 MED ORDER — DIPHENHYDRAMINE HCL 25 MG PO CAPS
25.0000 mg | ORAL_CAPSULE | Freq: Four times a day (QID) | ORAL | Status: DC | PRN
  Filled 2022-12-03: qty 1

## 2022-12-03 MED ORDER — ACETAMINOPHEN 10 MG/ML IV SOLN: 1000.0000 mg | Freq: Once | INTRAVENOUS | Status: DC | PRN

## 2022-12-03 MED ORDER — FENTANYL CITRATE (PF) 100 MCG/2ML IJ SOLN
50.0000 ug | INTRAMUSCULAR | Status: DC | PRN
  Administered 2022-12-03: 100 ug via INTRAVENOUS
  Filled 2022-12-03: qty 2

## 2022-12-03 MED ORDER — MORPHINE SULFATE (PF) 0.5 MG/ML IJ SOLN
INTRAMUSCULAR | Status: DC | PRN
  Administered 2022-12-03: .1 mg via INTRATHECAL

## 2022-12-03 MED ORDER — SODIUM CHLORIDE 0.9% FLUSH: 10.0000 mL | INTRAVENOUS | Status: DC | PRN

## 2022-12-03 MED ORDER — BUPIVACAINE IN DEXTROSE 0.75-8.25 % IT SOLN
INTRATHECAL | Status: DC | PRN
  Administered 2022-12-03: 1.5 mL via INTRATHECAL

## 2022-12-03 MED ORDER — LIDOCAINE HCL (PF) 1 % IJ SOLN
INTRAMUSCULAR | Status: DC | PRN
  Administered 2022-12-03: 3 mL via SUBCUTANEOUS

## 2022-12-03 MED ORDER — ONDANSETRON HCL 4 MG/2ML IJ SOLN
INTRAMUSCULAR | Status: AC
  Filled 2022-12-03: qty 2

## 2022-12-03 MED ORDER — OXYTOCIN-SODIUM CHLORIDE 30-0.9 UT/500ML-% IV SOLN
INTRAVENOUS | Status: AC
  Filled 2022-12-03: qty 500

## 2022-12-03 MED ORDER — SOD CITRATE-CITRIC ACID 500-334 MG/5ML PO SOLN
ORAL | Status: AC
  Filled 2022-12-03: qty 15

## 2022-12-03 MED ORDER — DROPERIDOL 2.5 MG/ML IJ SOLN
0.6250 mg | Freq: Once | INTRAMUSCULAR | Status: DC | PRN
  Filled 2022-12-03: qty 2

## 2022-12-03 MED ORDER — ONDANSETRON HCL 4 MG/2ML IJ SOLN: 4.0000 mg | Freq: Three times a day (TID) | INTRAMUSCULAR | Status: DC | PRN

## 2022-12-03 MED ORDER — PHENYLEPHRINE HCL-NACL 20-0.9 MG/250ML-% IV SOLN
INTRAVENOUS | Status: AC
  Filled 2022-12-03: qty 250

## 2022-12-03 MED ORDER — ACETAMINOPHEN 500 MG PO TABS
1000.0000 mg | ORAL_TABLET | Freq: Once | ORAL | Status: AC
  Administered 2022-12-03: 1000 mg via ORAL
  Filled 2022-12-03: qty 2

## 2022-12-03 MED ORDER — MENTHOL 3 MG MT LOZG: 1.0000 | LOZENGE | OROMUCOSAL | Status: DC | PRN

## 2022-12-03 MED ORDER — SIMETHICONE 80 MG PO CHEW
80.0000 mg | CHEWABLE_TABLET | Freq: Three times a day (TID) | ORAL | Status: DC
  Administered 2022-12-03 – 2022-12-06 (×8): 80 mg via ORAL
  Filled 2022-12-03 (×8): qty 1

## 2022-12-03 MED ORDER — SENNOSIDES-DOCUSATE SODIUM 8.6-50 MG PO TABS
2.0000 | ORAL_TABLET | Freq: Every day | ORAL | Status: DC
  Administered 2022-12-04: 2 via ORAL
  Filled 2022-12-03 (×2): qty 2

## 2022-12-03 MED ORDER — LIDOCAINE 5 % EX PTCH
MEDICATED_PATCH | CUTANEOUS | Status: AC
  Filled 2022-12-03: qty 1

## 2022-12-03 MED ORDER — ONDANSETRON HCL 4 MG/2ML IJ SOLN: 4.0000 mg | Freq: Once | INTRAMUSCULAR | Status: DC

## 2022-12-03 MED ORDER — LACTATED RINGERS IV BOLUS
500.0000 mL | Freq: Once | INTRAVENOUS | Status: AC
  Administered 2022-12-03: 500 mL via INTRAVENOUS

## 2022-12-03 MED ORDER — DIPHENHYDRAMINE HCL 25 MG PO CAPS
25.0000 mg | ORAL_CAPSULE | ORAL | Status: DC | PRN
  Administered 2022-12-03 – 2022-12-04 (×3): 25 mg via ORAL
  Filled 2022-12-03 (×2): qty 1

## 2022-12-03 MED ORDER — NALOXONE HCL 0.4 MG/ML IJ SOLN: 0.4000 mg | INTRAMUSCULAR | Status: DC | PRN

## 2022-12-03 MED ORDER — SODIUM CHLORIDE 0.9 % IV SOLN
500.0000 mg | INTRAVENOUS | Status: AC
  Administered 2022-12-03: 500 mg via INTRAVENOUS
  Filled 2022-12-03: qty 5

## 2022-12-03 MED ORDER — FENTANYL CITRATE (PF) 100 MCG/2ML IJ SOLN
INTRAMUSCULAR | Status: DC | PRN
  Administered 2022-12-03: 15 ug via INTRATHECAL

## 2022-12-03 MED ORDER — KETOROLAC TROMETHAMINE 30 MG/ML IJ SOLN
INTRAMUSCULAR | Status: AC
  Filled 2022-12-03: qty 1

## 2022-12-03 MED ORDER — OXYTOCIN-SODIUM CHLORIDE 30-0.9 UT/500ML-% IV SOLN
INTRAVENOUS | Status: DC | PRN
  Administered 2022-12-03: 400 mL via INTRAVENOUS
  Administered 2022-12-03: 250 mL/h via INTRAVENOUS

## 2022-12-03 MED ORDER — KETOROLAC TROMETHAMINE 30 MG/ML IJ SOLN
30.0000 mg | Freq: Four times a day (QID) | INTRAMUSCULAR | Status: AC | PRN
  Administered 2022-12-03 – 2022-12-04 (×3): 30 mg via INTRAVENOUS
  Filled 2022-12-03 (×3): qty 1

## 2022-12-03 MED ORDER — LIDOCAINE 5 % EX PTCH
MEDICATED_PATCH | CUTANEOUS | Status: DC | PRN
  Administered 2022-12-03: 1 via TRANSDERMAL

## 2022-12-03 MED ORDER — KETOROLAC TROMETHAMINE 30 MG/ML IJ SOLN: 30.0000 mg | Freq: Four times a day (QID) | INTRAMUSCULAR | Status: AC | PRN

## 2022-12-03 MED ORDER — DEXAMETHASONE SODIUM PHOSPHATE 10 MG/ML IJ SOLN
INTRAMUSCULAR | Status: DC | PRN
Start: 2022-12-03 — End: 2022-12-03
  Administered 2022-12-03: 10 mg via INTRAVENOUS

## 2022-12-03 MED ORDER — OXYCODONE HCL 5 MG PO TABS
5.0000 mg | ORAL_TABLET | Freq: Four times a day (QID) | ORAL | Status: DC | PRN
  Administered 2022-12-04 – 2022-12-05 (×4): 5 mg via ORAL
  Administered 2022-12-05: 10 mg via ORAL
  Administered 2022-12-06 (×2): 5 mg via ORAL
  Filled 2022-12-03 (×8): qty 1

## 2022-12-03 MED ORDER — IBUPROFEN 800 MG PO TABS
800.0000 mg | ORAL_TABLET | Freq: Three times a day (TID) | ORAL | Status: DC
  Administered 2022-12-04 – 2022-12-06 (×7): 800 mg via ORAL
  Filled 2022-12-03 (×7): qty 1

## 2022-12-03 MED ORDER — SOD CITRATE-CITRIC ACID 500-334 MG/5ML PO SOLN
30.0000 mL | ORAL | Status: AC
  Administered 2022-12-03: 30 mL via ORAL

## 2022-12-03 MED ORDER — ONDANSETRON HCL 4 MG/2ML IJ SOLN
INTRAMUSCULAR | Status: AC
  Administered 2022-12-03: 4 mg
  Filled 2022-12-03: qty 2

## 2022-12-03 MED ORDER — OXYCODONE HCL 5 MG/5ML PO SOLN: 5.0000 mg | Freq: Once | ORAL | Status: DC | PRN

## 2022-12-03 MED ORDER — LACTATED RINGERS IV SOLN
INTRAVENOUS | Status: DC | PRN
Start: 2022-12-03 — End: 2022-12-03

## 2022-12-03 MED ORDER — ACETAMINOPHEN 500 MG PO TABS
1000.0000 mg | ORAL_TABLET | Freq: Four times a day (QID) | ORAL | Status: DC
  Administered 2022-12-03: 1000 mg via ORAL

## 2022-12-03 MED ORDER — OXYCODONE HCL 5 MG PO TABS: 5.0000 mg | ORAL_TABLET | Freq: Once | ORAL | Status: DC | PRN

## 2022-12-03 MED ORDER — CEFAZOLIN SODIUM-DEXTROSE 2-4 GM/100ML-% IV SOLN
2.0000 g | INTRAVENOUS | Status: AC
  Administered 2022-12-03: 2 g via INTRAVENOUS
  Filled 2022-12-03: qty 100

## 2022-12-03 SURGICAL SUPPLY — 16 items
APL SKNCLS STERI-STRIP NONHPOA (GAUZE/BANDAGES/DRESSINGS) ×1
BENZOIN TINCTURE PRP APPL 2/3 (GAUZE/BANDAGES/DRESSINGS) IMPLANT
GLOVE BIOGEL PI IND STRL 6 (GLOVE) IMPLANT
GLOVE BIOGEL PI IND STRL 7.0 (GLOVE) IMPLANT
GLOVE SKINSENSE STRL SZ6.0 (GLOVE) IMPLANT
GLOVE SURG SYN 6.5 ES PF (GLOVE) ×1 IMPLANT
GLOVE SURG SYN 6.5 PF PI (GLOVE) IMPLANT
PAD TELFA 2X3 NADH STRL (GAUZE/BANDAGES/DRESSINGS) IMPLANT
RTRCTR C-SECT PINK 25CM LRG (MISCELLANEOUS) IMPLANT
STRIP CLOSURE SKIN 1/2X4 (GAUZE/BANDAGES/DRESSINGS) IMPLANT
SUT MON AB 3-0 SH 27 (SUTURE) IMPLANT
SUT PROLENE 0 CT 1 30 (SUTURE) IMPLANT
SUT VIC AB 0 CTX 36 (SUTURE) ×3
SUT VIC AB 0 CTX36XBRD ANBCTRL (SUTURE) IMPLANT
SUT VIC AB 4-0 KS 27 (SUTURE) IMPLANT
SUT VICRYL 0 UR6 27IN ABS (SUTURE) IMPLANT

## 2022-12-03 NOTE — Transfer of Care (Signed)
Immediate Anesthesia Transfer of Care Note  Patient: Jessica Mitchell  Procedure(s) Performed: REPEAT CESAREAN SECTION WITH BILATERAL TUBAL LIGATION  Patient Location: Mother/Baby  Anesthesia Type:Spinal  Level of Consciousness: awake, alert , and oriented  Airway & Oxygen Therapy: Patient Spontanous Breathing  Post-op Assessment: Report given to RN and Post -op Vital signs reviewed and stable  Post vital signs: Reviewed and stable  Last Vitals:  Vitals Value Taken Time  BP    Temp    Pulse    Resp    SpO2      Last Pain:  Vitals:   12/03/22 0758  TempSrc:   PainSc: 10-Worst pain ever      Patients Stated Pain Goal: 0 (12/03/22 0758)  Complications: No notable events documented.

## 2022-12-03 NOTE — Procedures (Addendum)
CESAREAN SECTION OPERATIVE REPORT   DATE OF SURGERY: 12/03/2022  SURGEON: Julieanne Manson, DO ASSISTANT:  Obie Dredge ANESTHESIA: Spinal  PROCEDURE: Repeat low transverse cesarean section Bilateral tubal ligation, [place] method Mirena IUD insertion  PREOPERATIVE DIAGNOSES: 1. Intrauterine pregnancy at [redacted]w[redacted]d 2. History of previous cesarean section x 3 3. Uterine contractions 4. Desires surgical sterilization 5. Desires IUD contraception  POSTOPERATIVE DIAGNOSES: 1. Same, s/p rLTCS with IUD insertion and BTL  QBL: 750cc DRAINS: foley catheter to gravity drainage, 150 ml of clear urine at end of the procedure TOTAL IV FLUIDS: SPECIMENS: bilateral fallopian tube segments COMPLICATIONS:  None  FINDINGS:  Viable female infant in cephalic presentation; APGARs 8/9; weight 2910 grams (6lbs, 7oz) Clear fluid at amniotomy Intact placenta with 3 vessel cord Uterus, tubes, and ovaries appeared normal  INDICATION and CONSENT: Jessica Mitchell is a 32 y.o. [id number] with IUP at [redacted]w[redacted]d presenting for painful uterine contractions, found to be dilated to 5cm, in setting of 3 prior cesarean deliveries. She also desires surgical sterilization with IUD placement. The patient understood that the risks of cesarean section include, but are not limited to, visceral or vascular injury, infection, blood loss and need for transfusion, prolonged hospitalization, and reoperation.  She understood the tubal ligation is permanent sterilization.  The risks of the procedure include possible injury to internal organs and intra-abdominal bleeding. There is also a small risk that the procedure will fail (3-06/998), which may result in future pregnancy or ectopic pregnancy.  The patient agreed to the procedure and signed the consent form.  The patient stated understanding and desired to proceed.  All questions were answered.  PROCEDURE:  After verbal and written informed consent was obtained, the patient  was taken to the operating room where spinal anesthesia was found to be adequate.  SCDs were applied to the lower extremities and a Foley catheter was placed in the bladder under sterile technique.  The patient was placed in dorsal supine position with a leftward tilt, prepped and draped in a sterile fashion.  Two grams of Cefazolin and 500mg  of Azithromycin were given for infection prophylaxis.  Level of anesthesia was confirmed to be adequate with Allis clamps.  A Pfannenstiel skin incision was made with the scalpel and carried down to the underlying layer of rectus fascia.  The fascia was nicked bilaterally in the midline with the scalpel and the fascial incision was extended laterally using Mayo scissors.  The superior aspect of the fascia was grasped with Kocher clamps and the underlying rectus muscles were dissected off sharply with Mayo scissors and bluntly.  In a similar fashion, the inferior aspect of fascia was grasped with Kocher clamps and the underlying rectus and pyramidalis were dissected off sharply and bluntly.  The rectus muscles were separated in the midline with the Bovie.  The peritoneum was found to be free of adherent bowel and entered bluntly.  The peritoneal incision was extended bluntly to the bladder reflection with good visualization of the bladder.  The Alexis retractor was inserted and vesicouterine peritoneum identified.  Intraabdomnial survey revealed scant, clear peritoneal fluid and a thinned-out lower uterine segment. A bladder flap was not developed. The lower uterine segment was incised transversely with the scalpel.  The amniotic sac was ruptured with an Allis clamp and clear fluid noted.  The uterine incision was extended bluntly in a cranial-caudal fashion.  The fetus was in cephalic presentation.  The head was flexed and elevated to the level of the uterine incision.  Gentle fundal pressure was applied and the infant was delivered without difficulty.  The nose and mouth  were suctioned with a bulb. Delayed cord clamping was performed for 60 seconds. The cord was doubly clamped and cut and cord blood obtained.  The infant was handed to the awaiting NICU team.  The placenta delivered intact & spontaneously with manual massage of the uterine fundus. The uterus was left in situ.  The inside of the uterus was gently wiped with lap sponges x 2 ensuring complete removal of placental membranes.  The Mirena IUD was then opened and the device was placed to the uterine fundus. The strings were grasped with ring forceps and inserted into the lower uterine segment.  The uterine incision was then repaired with a double layer closure of 0-Vicryl first in a locking fashion, followed by 0-Vicryl in an imbricating stitch. An additional figure of eight was placed at the left hysterotomy corner with a small piece of Fibrillar placed on top and excellent hemostasis was observed.The ovaries and tubes were found to be grossly normal.    Attention was then turned to the fallopian tubes. The left fallopian tube was identified and grasped with a Babcock clamp.  The tube was followed out to the fimbriae. A window was created in the mesosalpinx. The distal and proximal ends of the tube were individually ligated with 3-0 Plain suture. The tube was excised and sent to pathology for confirmation. Good hemostasis on both ends of the tubal stumps were noted. The peritoneal edge of the broad ligament was noted to be hemostatic. The tube was returned to the abdominal cavity.  The right fallopian tube was ligated in a similar fashion.  Good hemostasis was again noted.   The uterus, tubes, and ovaries were then returned to the abdominal cavity in the anatomical position.  The posterior cul-de-sac was cleared of clots and debris. Blood clots, debris and fluid were cleaned from the abdomen, gutters, and pelvis with moist laparotomy sponges.  The uterine incision was reinspected and was hemostatic, as were the tubal  sites.  The superior and inferior fascia were grasped with Kocher clamps and the rectus muscles were examined and found to be hemostatic, ensured with Bovie electrocautery.  The peritoneum was closed with 3-0 Monocryl in a continuous, running fashion, and the rectus muscles were not reapproximated.  The fascial layer was closed with 0-Vicryl in a running fashion.  The subcutaneous tissue was irrigated, made hemostatic with Bovie electrocautery, then reapproximated with a running layer of 3-0 Plain. The skin was closed subcuticularly with 4-0 Vicryl on a keith and steri-strips and a sterile pressure dressing was placed.  All sponge, lap and instrument counts were correct x 2. The patient tolerated the procedure well and was taken to the recovery room in stable condition.  An experienced assistant was required given the standard of surgical care given the complexity of the case.  This assistant was needed for exposure, dissection, suctioning, retraction, instrument exchange, and for overall help during the procedure.  Julieanne Manson, DO [facility]

## 2022-12-03 NOTE — Anesthesia Procedure Notes (Signed)
Spinal  Patient location during procedure: OR Start time: 12/03/2022 9:28 AM End time: 12/03/2022 9:31 AM Reason for block: surgical anesthesia Staffing Performed: resident/CRNA  Anesthesiologist: Yevette Edwards, MD Resident/CRNA: Hezzie Bump, CRNA Performed by: Hezzie Bump, CRNA Authorized by: Yevette Edwards, MD   Preanesthetic Checklist Completed: patient identified, IV checked, site marked, risks and benefits discussed, surgical consent, monitors and equipment checked, pre-op evaluation and timeout performed Spinal Block Patient position: sitting Prep: Betadine Patient monitoring: heart rate, continuous pulse ox, blood pressure and cardiac monitor Approach: midline Location: L4-5 Injection technique: single-shot Needle Needle type: Whitacre and Introducer  Needle gauge: 24 G Needle length: 9 cm Assessment Events: CSF return Additional Notes Negative paresthesia. Negative blood return. Positive free-flowing CSF. Expiration date of kit checked and confirmed. Patient tolerated procedure well, without complications.

## 2022-12-03 NOTE — Anesthesia Procedure Notes (Signed)
Procedure Name: MAC Date/Time: 12/03/2022 9:35 AM  Performed by: Hezzie Bump, CRNAPre-anesthesia Checklist: Patient identified, Emergency Drugs available, Suction available and Patient being monitored Patient Re-evaluated:Patient Re-evaluated prior to induction Oxygen Delivery Method: Nasal cannula Induction Type: IV induction Placement Confirmation: positive ETCO2

## 2022-12-03 NOTE — Lactation Note (Signed)
This note was copied from a baby's chart. Lactation Consultation Note  Patient Name: Jessica Mitchell [id number] Date: 12/03/2022 Age:34 hours Reason for consult: Initial assessment;Early term 37-38.6wks;Breastfeeding assistance;RN request   Maternal Data Mom is a [id number] delivered today by repeat C/S. Mom with history of anxiety, depression,  marijuana use.Mom is an experienced breastfeeding mother.  On initial visit mom reports baby prefers her left breast and her previous child did as well. Has patient been taught Hand Expression?: Yes Does the patient have breastfeeding experience prior to this delivery?: Yes How long did the patient breastfeed?: 18 months  Feeding Mother's Current Feeding Choice: Breast Milk Assisted mom with maximizing position and latch technique at her right breast.. Baby did latch and fed well. Mom pleased baby fed well using the football hold. Baby at the left breast prefers cradle hold. LATCH Score Latch: Grasps breast easily, tongue down, lips flanged, rhythmical sucking.  Audible Swallowing: Spontaneous and intermittent  Type of Nipple: Everted at rest and after stimulation  Comfort (Breast/Nipple): Soft / non-tender  Hold (Positioning): Assistance needed to correctly position infant at breast and maintain latch.  LATCH Score: 9   Interventions Interventions: Breast feeding basics reviewed;Breast massage;Breast compression;Adjust position;Support pillows;Position options;Education  Discharge Pump: Personal  Consult Status Consult Status: Follow-up Date: 12/04/22 Follow-up type: In-patient  Update provided to care nurse.  Fuller Song 12/03/2022, 6:05 PM

## 2022-12-03 NOTE — H&P (Signed)
Obstetric Preoperative History and Physical  Jessica Mitchell is a 32 y.o. [phone] with single IUP at [redacted]w[redacted]d presenting for regular, painful contractions that started around 0400 this am. Denies leaking of fluid or vaginal bleeding and reports normal strong fetal movement. Pt was scheduled for repeat cesarean section next week for hx of prior x 3.  No acute preoperative concerns.  Pt does endorse desire for tubal ligation with subsequent IUD placement during her surgery.   Cesarean Section Indication:  Prior CD x 3  Prenatal Course Source of Care: [facility] with onset of care at 7 weeks Pregnancy complications or risks: -Hx of prior CD x 3 -Rh-NEG status, received Rhogam appropriately -Marijuana user -Chlamydia positive in first trimester, test of cure x 2 negative -Depression/anxiety  She plans to breastfeed She desires bilateral tubal ligation, IUD for postpartum contraception. BTL consent done 10/06/22 in clinic.   Prenatal labs and studies: ABO/RH: O-NEG Antibody: NEG Rubella: Immune, 2.97 (03/26 1030) RPR: Non Reactive (07/31 0926)  HBsAg: Negative (03/26 1030)  Hep C: non reactive HIV: Non Reactive (07/31 0926)  XBM:WUXLKGMW/-- (09/25 1128)  1 hr Glucola:  98 Genetic screening:  low risk, XX Anatomy US normal, 28wk appropriate growth, posterior placenta  Vaccines:  Tdap: 09/22/22 RSV Vaccine: 11/17/22 Flu vaccine: 11/24/22  Past Medical History:  Diagnosis Date   Alcohol use    with pregnancy   Anemia    Anxiety    Depression    depression and anxiety   Fatigue    History of marijuana use    History of tobacco use    Indigestion    Irregular heartbeat    in middle school   Mucous in stools    Unintentional weight loss    Vaginal Pap smear, abnormal     Past Surgical History:  Procedure Laterality Date   CESAREAN SECTION  07/18/2007   twins   CESAREAN SECTION N/A 04/02/2018   Procedure: CESAREAN SECTION;  Surgeon: Conard Novak, MD;   Location: [facility];  Service: Obstetrics;  Laterality: N/A;   CESAREAN SECTION N/A 11/11/2020   Procedure: CESAREAN SECTION;  Surgeon: Conard Novak, MD;  Location: [facility] OR[facility];  Service: Obstetrics;  Laterality: N/A;   FINGER SURGERY Right    2/2 trauma    OB History  Gravida Para Term Preterm AB Living  4 3 2 1  0 4  SAB IAB Ectopic Multiple Live Births  0 0 0 1 4    # Outcome Date GA Lbr Len/2nd Weight Sex Type Anes PTL Lv  4 Current           3 Term 11/11/20 [redacted]w[redacted]d  2790 g F CS-LTranv Spinal  LIV  2 Term 04/02/18 106w6d  2990 g M CS-LTranv Spinal  LIV  1A Preterm 07/18/07   1814 g M CS-LTranv  Y LIV  1B Preterm    1361 g M CS-LTranv  Y LIV    Social History   Socioeconomic History   Marital status: Married    Spouse name: Proofreader   Number of children: 4   Years of education: 14   Highest education level: High school graduate  Occupational History   Occupation: Occupational psychologist  Tobacco Use   Smoking status: Former    Types: Cigarettes   Smokeless tobacco: Never  Vaping Use   Vaping status: Never Used  Substance and Sexual Activity   Alcohol use: Yes    Comment: occ with pregnancy   Drug use: Not  Currently    Types: Marijuana    Comment: last mj use 2022   Sexual activity: Yes    Partners: Male    Birth control/protection: None    Comment: hx ocp and depo  Other Topics Concern   Not on file  Social History Narrative   Not on file   Social Determinants of Health   Financial Resource Strain: Low Risk  (04/28/2022)   Overall Financial Resource Strain (CARDIA)    Difficulty of Paying Living Expenses: Not hard at all  Food Insecurity: No Food Insecurity (04/28/2022)   Hunger Vital Sign    Worried About Running Out of Food in the Last Year: Never true    Ran Out of Food in the Last Year: Never true  Transportation Needs: No Transportation Needs (04/28/2022)   PRAPARE - Administrator, Civil Service (Medical): No    Lack of  Transportation (Non-Medical): No  Physical Activity: Inactive (04/28/2022)   Exercise Vital Sign    Days of Exercise per Week: 0 days    Minutes of Exercise per Session: 0 min  Stress: No Stress Concern Present (04/28/2022)   [employer] of Occupational Health - Occupational Stress Questionnaire    Feeling of Stress : Not at all  Social Connections: Moderately Integrated (04/28/2022)   Social Connection and Isolation Panel [NHANES]    Frequency of Communication with Friends and Family: Three times a week    Frequency of Social Gatherings with Friends and Family: Twice a week    Attends Religious Services: More than 4 times per year    Active Member of [employer] or Organizations: No    Attends Engineer, structural: Never    Marital Status: Married    Family History  Problem Relation Age of Onset   COPD Maternal Grandmother    Lung cancer Maternal Grandmother    Diabetes Maternal Grandfather    Hypertension Maternal Grandfather    Stroke Maternal Grandfather    Lung cancer Paternal Grandmother     Medications Prior to Admission  Medication Sig Dispense Refill Last Dose   Ferric Maltol (ACCRUFER) 30 MG CAPS Take 1 capsule (30 mg total) by mouth 2 (two) times daily. Take 1 hour before or 2 hours after meals. (Patient taking differently: Take 30 mg by mouth 2 (two) times a week. Take 1 hour before or 2 hours after meals.) 60 capsule 3 Past Month   Prenatal Vit-Fe Phos-FA-Omega (VITAFOL GUMMIES) 3.33-0.333-34.8 MG CHEW Chew 1 tablet by mouth daily. 30 tablet 11 Past Week    No Known Allergies  Review of Systems: Pertinent items noted in HPI and remainder of comprehensive ROS otherwise negative.  Physical Exam: BP 138/76 (BP Location: Right Arm)   Pulse (!) 50   Temp 98.1 F (36.7 C) (Oral)   Resp 18   Ht 5\' 3"  (1.6 m)   Wt 72.9 kg   LMP 02/28/2022 (Within Days)   BMI 28.48 kg/m   FHR by Doppler: 140s bpm CONSTITUTIONAL: Well-developed, well-nourished female in no  acute distress.  HENT:  Normocephalic, atraumatic, External right and left ear normal. Oropharynx is clear and moist EYES: Conjunctivae and EOM are normal. Pupils are equal, round, and reactive to light. No scleral icterus.  NECK: Normal range of motion, supple, no masses SKIN: Skin is warm and dry. No rash noted. Not diaphoretic. No erythema. No pallor. NEUROLOGIC: Alert and oriented to person, place, and time. Normal reflexes, muscle tone coordination. No cranial nerve deficit noted. PSYCHIATRIC:  Normal mood and affect. Normal behavior. Normal judgment and thought content. CARDIOVASCULAR: Normal heart rate noted, regular rhythm RESPIRATORY: Effort and breath sounds normal, no problems with respiration noted ABDOMEN: Soft, nontender, nondistended, gravid. Well-healed Pfannenstiel incision. PELVIC: Deferred, per RN, 5cm dilated, BOWI MUSCULOSKELETAL: Normal range of motion. No edema and no tenderness. 2+ distal pulses.  Pertinent Labs/Studies:   Results for orders placed or performed during the hospital encounter of 12/03/22 (from the past 72 hour(s))  CBC     Status: Abnormal   Collection Time: 12/03/22  8:22 AM  Result Value Ref Range   WBC 12.3 (H) 4.0 - 10.5 K/uL   RBC 4.53 3.87 - 5.11 MIL/uL   Hemoglobin 11.0 (L) 12.0 - 15.0 g/dL   HCT 16.1 (L) 09.6 - 04.5 %   MCV 77.3 (L) 80.0 - 100.0 fL   MCH 24.3 (L) 26.0 - 34.0 pg   MCHC 31.4 30.0 - 36.0 g/dL   RDW 40.9 (H) 81.1 - 91.4 %   Platelets 181 150 - 400 K/uL   nRBC 0.2 0.0 - 0.2 %    Comment: Performed at [facility], [address] [place], [place] [address]  Type and screen [facility]     Status: None (Preliminary result)   Collection Time: 12/03/22  8:22 AM  Result Value Ref Range   ABO/RH(D) PENDING    Antibody Screen PENDING    Sample Expiration      12/06/2022,2359 Performed at [facility], [address] [place], [place] [address]     Assessment and Plan: JYLL TOMARO is a 32 y.o. [id number] at [redacted]w[redacted]d being admitted for repeat cesarean section in active labor. The risks of surgery were discussed with the patient including but were not limited to: bleeding which may require transfusion or reoperation; infection which may require antibiotics; injury to bowel, bladder, ureters or other surrounding organs; injury to the fetus; need for additional procedures including hysterectomy in the event of a life-threatening hemorrhage; formation of adhesions; placental abnormalities wth subsequent pregnancies; incisional problems; thromboembolic phenomenon and other postoperative/anesthesia complications. The patient concurred with the proposed plan, giving informed written consent for the procedure. Patient has been NPO since last night she will remain NPO for procedure. Anesthesia and OR aware. Preoperative prophylactic antibiotics and SCDs ordered on call to the OR. To OR when ready.    Jessica Manson, DO [facility]

## 2022-12-03 NOTE — Anesthesia Preprocedure Evaluation (Signed)
Anesthesia Evaluation  Patient identified by MRN, date of birth, ID band Patient awake    Reviewed: Allergy & Precautions, H&P , NPO status , Patient's Chart, lab work & pertinent test results, reviewed documented beta blocker date and time   Airway Mallampati: II  TM Distance: >3 FB Neck ROM: full    Dental no notable dental hx. (+) Teeth Intact   Pulmonary neg pulmonary ROS, former smoker   Pulmonary exam normal breath sounds clear to auscultation       Cardiovascular Exercise Tolerance: Good negative cardio ROS  Rhythm:regular Rate:Normal     Neuro/Psych negative neurological ROS  negative psych ROS   GI/Hepatic negative GI ROS, Neg liver ROS,,,  Endo/Other  negative endocrine ROSdiabetes, Gestational    Renal/GU      Musculoskeletal   Abdominal   Peds  Hematology  (+) Blood dyscrasia, anemia   Anesthesia Other Findings   Reproductive/Obstetrics (+) Pregnancy                             Anesthesia Physical Anesthesia Plan  ASA: 2  Anesthesia Plan: Spinal   Post-op Pain Management:    Induction:   PONV Risk Score and Plan:   Airway Management Planned:   Additional Equipment:   Intra-op Plan:   Post-operative Plan:   Informed Consent: I have reviewed the patients History and Physical, chart, labs and discussed the procedure including the risks, benefits and alternatives for the proposed anesthesia with the patient or authorized representative who has indicated his/her understanding and acceptance.       Plan Discussed with:   Anesthesia Plan Comments:        Anesthesia Quick Evaluation

## 2022-12-04 LAB — CBC
HCT: 25.3 % — ABNORMAL LOW (ref 36.0–46.0)
Hemoglobin: 7.9 g/dL — ABNORMAL LOW (ref 12.0–15.0)
MCH: 24.9 pg — ABNORMAL LOW (ref 26.0–34.0)
MCHC: 31.2 g/dL (ref 30.0–36.0)
MCV: 79.8 fL — ABNORMAL LOW (ref 80.0–100.0)
Platelets: 152 10*3/uL (ref 150–400)
RBC: 3.17 MIL/uL — ABNORMAL LOW (ref 3.87–5.11)
RDW: 16.5 % — ABNORMAL HIGH (ref 11.5–15.5)
WBC: 20.2 10*3/uL — ABNORMAL HIGH (ref 4.0–10.5)
nRBC: 0 % (ref 0.0–0.2)

## 2022-12-04 LAB — RPR: RPR Ser Ql: NONREACTIVE

## 2022-12-04 LAB — FETAL SCREEN: Fetal Screen: NEGATIVE

## 2022-12-04 MED ORDER — ALBUTEROL SULFATE (2.5 MG/3ML) 0.083% IN NEBU: 2.5000 mg | INHALATION_SOLUTION | Freq: Once | RESPIRATORY_TRACT | Status: DC | PRN

## 2022-12-04 MED ORDER — EPINEPHRINE PF 1 MG/ML IJ SOLN: 0.3000 mg | Freq: Once | INTRAMUSCULAR | Status: DC | PRN

## 2022-12-04 MED ORDER — IRON SUCROSE 500 MG IVPB - SIMPLE MED
500.0000 mg | Freq: Once | INTRAVENOUS | Status: AC
  Administered 2022-12-04: 500 mg via INTRAVENOUS
  Filled 2022-12-04: qty 275

## 2022-12-04 MED ORDER — SODIUM CHLORIDE 0.9 % IV BOLUS: 500.0000 mL | Freq: Once | INTRAVENOUS | Status: DC | PRN

## 2022-12-04 MED ORDER — SODIUM CHLORIDE 0.9 % IV SOLN: INTRAVENOUS | Status: AC | PRN

## 2022-12-04 MED ORDER — DIPHENHYDRAMINE HCL 50 MG/ML IJ SOLN: 25.0000 mg | Freq: Once | INTRAMUSCULAR | Status: DC | PRN

## 2022-12-04 MED ORDER — METHYLPREDNISOLONE SODIUM SUCC 125 MG IJ SOLR: 125.0000 mg | Freq: Once | INTRAMUSCULAR | Status: DC | PRN

## 2022-12-04 MED ORDER — RHO D IMMUNE GLOBULIN 1500 UNIT/2ML IJ SOSY
300.0000 ug | PREFILLED_SYRINGE | Freq: Once | INTRAMUSCULAR | Status: AC
  Administered 2022-12-04: 300 ug via INTRAVENOUS
  Filled 2022-12-04: qty 2

## 2022-12-04 NOTE — Progress Notes (Signed)
Postpartum Day # 1: Cesarean Delivery  Subjective: Pt is ambulating without assistance, tolerating PO, voiding without difficulty, + flatus, no BM, lochia = menses, and is pain-controlled. Breast and bottle feeding, baby at bedside doing well. Pt would like an abdominal binder and heating pad to help with some of the abd soreness. Thinking she will go home tomorrow, but will base on how she feels tonight. Hx of anemia, denies dizziness, lightheadedness, palpitations.   Objective: Vital signs in last 24 hours: Temp:  [97.8 F (36.6 C)-99 F (37.2 C)] 98.5 F (36.9 C) (10/12 0322) Pulse Rate:  [55-86] 56 (10/12 0322) Resp:  [6-30] 18 (10/12 0322) BP: (101-126)/(60-85) 106/64 (10/12 0322) SpO2:  [96 %-99 %] 98 % (10/12 0322)  Physical Exam:  General: alert and cooperative Lungs: clear to auscultation bilaterally Breasts: normal appearance, no masses or tenderness Heart: regular rate and rhythm, S1, S2 normal, no murmur, click, rub or gallop Abdomen: soft, non-tender; bowel sounds normal; no masses,  no organomegaly Uterine Fundus firm, Incision: pressure bandage still covering Extremities: Negative Homan's sign. No cords or calf tenderness. No significant calf/ankle edema.  Recent Labs    12/03/22 0822 12/04/22 0423  HGB 11.0* 7.9*  HCT 35.0* 25.3*    Assessment/Plan: 31yo G4 now P3105 s/p rLTCS for prior x 3 and labor, now POD#1, doing well.  1) Meeting early milestones, encourage ambulation -Abdominal binder ordered  2) O-Neg: postpartum Rhogam indicated  3) Feeds: breast and bottle, lactation consult prn  4) Acute blood loss anemia: Hgb admit 11.0 -> 7.9 POD#1 -IVFe ordered for today  Anticipate DC home tomorrow, POD#2. Continue routine care.    Julieanne Manson, DO [facility]

## 2022-12-04 NOTE — Lactation Note (Addendum)
This note was copied from a baby's chart. Lactation Consultation Note  Patient Name: Jessica Mitchell [id number] Date: 12/04/2022 Age:34 hours   See Feeding flowsheet  Maternal Data  BF last child x 18 mths  Feeding Mom states that she has two toddlers at home and will be mostly feeding formula, she states that she mostly wants baby to get initial colostrum and milk. I did not observe a breastfeeding and mom formula fed baby this feeding   LATCH Score                    Lactation Tools Discussed/Used  LC name and no written on white board  Mom has a hands free breast pump at home Interventions  I offered to assist with breastfeeding or observe a feeding if mom desires.  Discharge    Consult Status   PRN   Dyann Kief 12/04/2022, 4:30 PM

## 2022-12-04 NOTE — Anesthesia Postprocedure Evaluation (Signed)
Anesthesia Post Note  Patient: Jessica Mitchell  Procedure(s) Performed: REPEAT CESAREAN SECTION WITH BILATERAL TUBAL LIGATION  Patient location during evaluation: Mother Baby Anesthesia Type: Spinal Level of consciousness: oriented and awake and alert Pain management: pain level controlled Vital Signs Assessment: post-procedure vital signs reviewed and stable Respiratory status: spontaneous breathing and respiratory function stable Cardiovascular status: blood pressure returned to baseline and stable Postop Assessment: no headache, no backache, no apparent nausea or vomiting and able to ambulate Anesthetic complications: no   No notable events documented.   Last Vitals:  Vitals:   12/04/22 0000 12/04/22 0322  BP: 109/62 106/64  Pulse:  (!) 56  Resp: 18 18  Temp: 37.2 C 36.9 C  SpO2: 98% 98%    Last Pain:  Vitals:   12/04/22 0550  TempSrc:   PainSc: 0-No pain                 Lenard Simmer

## 2022-12-04 NOTE — Anesthesia Post-op Follow-up Note (Signed)
  Anesthesia Pain Follow-up Note  Patient: Jessica Mitchell  Day #: 1  Date of Follow-up: 12/04/2022 Time: 11:22 AM  Last Vitals:  Vitals:   12/04/22 0000 12/04/22 0322  BP: 109/62 106/64  Pulse:  (!) 56  Resp: 18 18  Temp: 37.2 C 36.9 C  SpO2: 98% 98%    Level of Consciousness: alert  Pain: mild   Side Effects:None  Catheter Site Exam:clean, dry     Plan: D/C from anesthesia care at surgeon's request  Lenard Simmer

## 2022-12-05 LAB — RHOGAM INJECTION: Unit division: 0

## 2022-12-05 NOTE — Progress Notes (Signed)
Subjective:   Jessica Mitchell had a repeat c/s on 12/03/22. Had a history of three prior sections and starting contracting ahead of her scheduled section leading to her section on 10/11.  Pt. Is eating, hydrating, and voiding regularly without difficulty. After receiving senekot last night she has had numerous BMs. She is primarily bottle feeding but plans to do some breastfeeding mainly once home. Has latched baby a few times without difficulty or pain. Did discuss need for expression/stimulation to maintain milk supply if desired. Reports scant vaginal bleeding, denies passing large blood clots. Has had cramping abdomen pain relieved with tylenol/ibuprofen and occasional roxi use. Had tubal ligation for contraception as part of c/s procedure. Denies anxiety/depression symptoms. Endorses good support from partner and family.   Is unsure whether she wants to go home or not today. Wants to talk to partner before making decision.  Jessica Mitchell received an iron infusion yesterday or anemia. Denies SOB, palpitations, dizziness today.  Objective:  Vital signs in last 24 hours: Temp:  [97.9 F (36.6 C)-98.5 F (36.9 C)] 98 F (36.7 C) (10/13 0901) Pulse Rate:  [65-75] 75 (10/13 0901) Resp:  [17-20] 19 (10/13 0901) BP: (109-132)/(72-83) 123/80 (10/13 0901) SpO2:  [97 %-100 %] 98 % (10/13 0901)    General: NAD Pulmonary: no increased work of breathing Breasts: soft, non-tender, nipples without breakdown Abdomen: soft, non-tender Fundus: firm, midline, at umbilicus Incision: honeycomb dressing intact without drainage Lochia: light rubra, no clots Perineum: no erythema or foul odor discharge, minimal edema, laceration well approximated  Extremities: no edema, no erythema, no tenderness  Results for orders placed or performed during the hospital encounter of 12/03/22 (from the past 72 hour(s))  CBC     Status: Abnormal   Collection Time: 12/03/22  8:22 AM  Result Value Ref Range   WBC  12.3 (H) 4.0 - 10.5 K/uL   RBC 4.53 3.87 - 5.11 MIL/uL   Hemoglobin 11.0 (L) 12.0 - 15.0 g/dL   HCT 16.1 (L) 09.6 - 04.5 %   MCV 77.3 (L) 80.0 - 100.0 fL   MCH 24.3 (L) 26.0 - 34.0 pg   MCHC 31.4 30.0 - 36.0 g/dL   RDW 40.9 (H) 81.1 - 91.4 %   Platelets 181 150 - 400 K/uL   nRBC 0.2 0.0 - 0.2 %    Comment: Performed at [facility], [address] [place], [place] [address]  Type and screen [facility]     Status: None   Collection Time: 12/03/22  8:22 AM  Result Value Ref Range   ABO/RH(D) O NEG    Antibody Screen POS    Sample Expiration 12/06/2022,2359    Antibody Identification      PASSIVELY ACQUIRED ANTI-D Performed at [facility], [address] [place], [place] [address]   RPR     Status: None   Collection Time: 12/03/22  8:22 AM  Result Value Ref Range   RPR Ser Ql NON REACTIVE NON REACTIVE    Comment: Performed at [facility], [address], [place], [place] [address]  Fetal screen     Status: None   Collection Time: 12/04/22  4:22 AM  Result Value Ref Range   Fetal Screen      NEG Performed at [facility], [address] [place], [place] [address]   Rhogam injection     Status: None   Collection Time: 12/04/22  4:22 AM  Result Value Ref Range  Unit Number [id number]    Blood Component Type RHIG    Unit division 00    Status of Unit ISSUED,FINAL    Transfusion Status      OK TO TRANSFUSE Performed at [facility], [address] [place], [place] [address]   CBC     Status: Abnormal   Collection Time: 12/04/22  4:23 AM  Result Value Ref Range   WBC 20.2 (H) 4.0 - 10.5 K/uL   RBC 3.17 (L) 3.87 - 5.11 MIL/uL   Hemoglobin 7.9 (L) 12.0 - 15.0 g/dL    Comment: REPEATED TO VERIFY   HCT 25.3 (L) 36.0 - 46.0 %   MCV 79.8 (L) 80.0 - 100.0 fL   MCH 24.9 (L) 26.0 - 34.0 pg   MCHC 31.2 30.0 - 36.0 g/dL   RDW 47.8 (H) 29.5 - 62.1 %   Platelets 152 150 - 400 K/uL   nRBC 0.0 0.0 - 0.2 %     Comment: Performed at [facility], [address], [place], [place] [address]    Assessment:   32 y.o. [phone] 2 day(s)  s/p repeat c/s with BTL Primarily bottle feeding Anemia secondary to acute blood loss- hemodynamically stable and symptomatic VSS Pain well controlled  Plan:    Received IV Fe infusion yesterday, will continue PO Fe Blood Type --/--/O NEG (10/11 0822) / Rubella 2.97 (03/26 1030) / Varicella Immune Rhogam to be given before discharge Tdap/varicella/rubella not indicated  Feeding plan breast & bottle, lactation support Encouraged to continue breastfeeding, BF education on latch, position changes, cluster feeding, hunger cues, lactogenesis II, milk supply Education given regarding options for contraception, as well as compatibility with breast feeding if applicable.  Patient plans on IUD and tubal ligation for contraception. Continued routine postpartum care  Counseled on normal uterine involution and vaginal bleeding postpartum Patient is stable and could be discharged today if desired, may also stay an additional day. She will let her nurse know her preferences was decided and I will discharge her today is she decides she wants to go home.    Raeford Razor, FNP, CNM [facility] 12/05/2022, 9:48 AM

## 2022-12-06 ENCOUNTER — Other Ambulatory Visit: Payer: Self-pay

## 2022-12-06 ENCOUNTER — Inpatient Hospital Stay: Admission: RE | Admit: 2022-12-06 | Payer: Medicaid Other | Source: Ambulatory Visit

## 2022-12-06 ENCOUNTER — Encounter: Payer: Self-pay | Admitting: Obstetrics

## 2022-12-06 LAB — SURGICAL PATHOLOGY

## 2022-12-06 MED ORDER — LIDOCAINE 4 % EX PTCH
1.0000 | MEDICATED_PATCH | CUTANEOUS | 1 refills | Status: AC
  Filled 2022-12-06: qty 6, 6d supply, fill #0

## 2022-12-06 MED ORDER — ACETAMINOPHEN 500 MG PO TABS
1000.0000 mg | ORAL_TABLET | Freq: Four times a day (QID) | ORAL | 1 refills | Status: AC
  Filled 2022-12-06: qty 60, 8d supply, fill #0

## 2022-12-06 MED ORDER — SIMETHICONE 80 MG PO CHEW
80.0000 mg | CHEWABLE_TABLET | ORAL | 0 refills | Status: AC | PRN
  Filled 2022-12-06: qty 30, 30d supply, fill #0

## 2022-12-06 MED ORDER — IBUPROFEN 800 MG PO TABS
800.0000 mg | ORAL_TABLET | Freq: Three times a day (TID) | ORAL | 1 refills | Status: AC
  Filled 2022-12-06: qty 60, 20d supply, fill #0

## 2022-12-06 MED ORDER — DOCUSATE SODIUM 100 MG PO CAPS
100.0000 mg | ORAL_CAPSULE | Freq: Two times a day (BID) | ORAL | 2 refills | Status: AC
Start: 2022-12-06 — End: 2023-12-06
  Filled 2022-12-06: qty 60, 30d supply, fill #0

## 2022-12-06 MED ORDER — OXYCODONE HCL 5 MG PO TABS
5.0000 mg | ORAL_TABLET | Freq: Four times a day (QID) | ORAL | 0 refills | Status: AC | PRN
  Filled 2022-12-06: qty 20, 5d supply, fill #0

## 2022-12-06 MED ORDER — OXYCODONE HCL 5 MG PO TABS
5.0000 mg | ORAL_TABLET | Freq: Four times a day (QID) | ORAL | 0 refills | Status: DC | PRN
  Filled 2022-12-06: qty 30, 5d supply, fill #0

## 2022-12-06 NOTE — Discharge Summary (Addendum)
Postpartum Discharge Summary    Patient Name: Jessica Mitchell DOB: Nov 13, 1990 MRN: [id number]  Date of admission: 12/03/2022 Delivery date:12/03/2022 Delivering provider: Julieanne Manson Date of discharge: 12/06/2022  Admitting diagnosis: Labor and delivery, indication for care [O75.9] Intrauterine pregnancy: [redacted]w[redacted]d     Secondary diagnosis:  Active Problems:   Depression   Anxiety   History of 3 cesarean sections   Marijuana user   Rh negative state in antepartum period   Cesarean delivery delivered   Encounter for female sterilization procedure   S/P tubal ligation   Previous cesarean delivery, delivered   Single live birth  Additional problems: acute blood loss anemia    Discharge diagnosis: Term Pregnancy Delivered                                              Post partum procedures: IV iron Augmentation: N/A Complications: None  Hospital course: Sceduled C/S   32 y.o. yo [id number] at [redacted]w[redacted]d was admitted to the hospital 12/03/2022 for unscheduled cesarean section due to onset of labor with the following indication: previous cesarean x3 .Delivery details are as follows:  Membrane Rupture Time/Date: 9:57 AM,12/03/2022  Delivery Method:C-Section, Low Transverse Operative Delivery:N/A Details of operation can be found in separate operative note.  Patient had a postpartum course complicated by anemia requiring iron infusin.  She is ambulating, tolerating a regular diet, passing flatus, and urinating well. Patient is discharged home in stable condition on  12/06/22        Newborn Data: Birth date:12/03/2022 Birth time:9:58 AM Gender:Female Living status:Living Apgars:8 ,9  Weight:2910 g    Magnesium Sulfate received: No BMZ received: No Rhophylac:Yes MMR:N/A T-DaP:Given prenatally Flu: Given prenatally RSV Vaccine received: Yes Transfusion:No Immunizations administered: Immunization History  Administered Date(s) Administered   Influenza, Seasonal, Injecte,  Preservative Fre 11/24/2022   Influenza,inj,Quad PF,6+ Mos 11/11/2017, 10/29/2020   PFIZER(Purple Top)SARS-COV-2 Vaccination 07/30/2019, 08/27/2019   PPD Test 04/11/2020   Rsv, Bivalent, Protein Subunit Rsvpref,pf Verdis Frederickson) 11/17/2022   Tdap 02/02/2018, 10/13/2020, 09/22/2022    Physical exam  Vitals:   12/05/22 0901 12/05/22 1535 12/05/22 2350 12/06/22 0848  BP: 123/80 128/82 124/70 123/80  Pulse: 75 72 67 72  Resp: 19 20 18 20   Temp: 98 F (36.7 C) 98.4 F (36.9 C) 98.6 F (37 C) 98.1 F (36.7 C)  TempSrc: Oral Oral Axillary Oral  SpO2: 98% 100% 97% 98%  Weight:      Height:       General: alert, cooperative, and no distress Lochia: appropriate Uterine Fundus: firm Incision: Dressing is clean, dry, and intact DVT Evaluation: No evidence of DVT seen on physical exam. Labs: Lab Results  Component Value Date   WBC 20.2 (H) 12/04/2022   HGB 7.9 (L) 12/04/2022   HCT 25.3 (L) 12/04/2022   MCV 79.8 (L) 12/04/2022   PLT 152 12/04/2022      Latest Ref Rng & Units 04/11/2020    2:37 AM  CMP  Glucose 70 - 99 mg/dL 98   BUN 6 - 20 mg/dL 6   Creatinine 1.47 - 8.29 mg/dL 5.62   Sodium 130 - 865 mmol/L 134   Potassium 3.5 - 5.1 mmol/L 3.5   Chloride 98 - 111 mmol/L 107   CO2 22 - 32 mmol/L 19   Calcium 8.9 - 10.3 mg/dL 9.2   Total Protein 6.5 -  8.1 g/dL 7.8   Total Bilirubin 0.3 - 1.2 mg/dL 0.8   Alkaline Phos 38 - 126 U/L 48   AST 15 - 41 U/L 17   ALT 0 - 44 U/L 12    Edinburgh Score:    12/05/2022    4:00 AM  Edinburgh Postnatal Depression Scale Screening Tool  I have been able to laugh and see the funny side of things. 0  I have looked forward with enjoyment to things. 0  I have blamed myself unnecessarily when things went wrong. 0  I have been anxious or worried for no good reason. 0  I have felt scared or panicky for no good reason. 0  Things have been getting on top of me. 0  I have been so unhappy that I have had difficulty sleeping. 0  I have felt sad or  miserable. 0  I have been so unhappy that I have been crying. 0  The thought of harming myself has occurred to me. 0  Edinburgh Postnatal Depression Scale Total 0      After visit meds:  Allergies as of 12/06/2022   No Known Allergies      Medication List     STOP taking these medications    ACCRUFeR 30 MG Caps Generic drug: Ferric Maltol       TAKE these medications    acetaminophen 500 MG tablet Commonly known as: TYLENOL Take 2 tablets (1,000 mg total) by mouth every 6 (six) hours. (Do not exceed 4,000mg  per day).   docusate sodium 100 MG capsule Commonly known as: Colace Take 1 capsule (100 mg total) by mouth 2 (two) times daily.   ibuprofen 800 MG tablet Commonly known as: ADVIL Take 1 tablet (800 mg total) by mouth every 8 (eight) hours.   lidocaine 4 % Place 1 patch onto the skin once daily. (Remove old patch).   oxyCODONE 5 MG immediate release tablet Commonly known as: Oxy IR/ROXICODONE Take 1-2 tablets (5-10 mg total) by mouth every 6 (six) hours as needed for up to 5 days for severe pain or breakthrough pain.   simethicone 80 MG chewable tablet Commonly known as: MYLICON Chew 1 tablet (80 mg total) by mouth as needed for flatulence.   Vitafol Gummies 3.33-0.333-34.8 MG Chew Chew 1 tablet by mouth daily.         Discharge home in stable condition Infant Feeding: Bottle and Breast Infant Disposition:home with mother Discharge instruction: per After Visit Summary and Postpartum booklet. Activity: Advance as tolerated. Pelvic rest for 6 weeks.  Diet: routine diet Anticipated Birth Control: BTL done PP Postpartum Appointment:6 weeks Additional Postpartum F/U: Incision check 1 week Future Appointments: Future Appointments  Date Time Provider Department Center  12/15/2022 10:55 AM Hildred Laser, MD [facility] None   Follow up Visit:  Follow-up Information     Julieanne Manson, MD Follow up in 1 week(s).   Specialties: Obstetrics,  Gynecology Why: Incision & mood check Contact information: [address] [place] [address] [phone]         [facility] Follow up in 6 week(s).   Specialty: Obstetrics and Gynecology Why: Postpartum visit Contact information: [address] [place] [address] [phone]                    12/06/2022 Dominica Severin, CNM

## 2022-12-06 NOTE — Discharge Instructions (Signed)
Discharge instructions:   Call office if you have any of the following:  headache, visual changes, fever >101.0 F, chills, breast concerns (engorgement, mastitis) excessive vaginal bleeding, incision drainage or problems, leg pain or redness, depression or any other concerns.   Activity: Do not lift > 10 lbs for 6 weeks.  No intercourse or tampons for 6 weeks.  No driving for 1-2 weeks or while taking pain medication. No strenuous activity or heavy lifting for 6 weeks.  No swimming pools, hot tubs or tub baths- showers only.    It is normal to bleed for up to 6 weeks. You should not soak through more than 1 pad in 1 hour.   Continue prenatal vitamin. Increase calories and fluids while breastfeeding.  Your milk will come in, in the next couple of days (right now it is colostrum).  You may have a slight fever when your milk comes in, but it should go away on its own.   If it does not, and rises above 101 F please call the doctor.  You will also feel achy and your breasts will be firm. They will also start to leak.  If you are breastfeeding, continue as you have been and you can pump/express milk for comfort.   For concerns about your baby, please call your pediatrician For breastfeeding concerns, the lactation consultant can be reached at [phone]  Postpartum blues (feelings of happy one minute and sad another minute) are normal for the first few weeks but if it gets worse let your doctor know.

## 2022-12-07 ENCOUNTER — Telehealth: Payer: Self-pay

## 2022-12-07 ENCOUNTER — Inpatient Hospital Stay
Admission: RE | Admit: 2022-12-07 | Payer: Medicaid Other | Source: Ambulatory Visit | Admitting: Obstetrics and Gynecology

## 2022-12-07 NOTE — Telephone Encounter (Signed)
Pt calling to let us know that her IUD came out; it was inserted after her c/s.  [phone]  Pt states she had to pull it out of the vagina.  Just wanted Dr. Lonny Prude to know and wants to discuss bc at six week appt.

## 2022-12-08 ENCOUNTER — Telehealth: Payer: Self-pay

## 2022-12-08 NOTE — Op Note (Signed)
CESAREAN SECTION OPERATIVE REPORT     DATE OF SURGERY: 12/03/2022   SURGEON: Julieanne Manson, DO ASSISTANT:  Obie Dredge ANESTHESIA: Spinal   PROCEDURE: Repeat low transverse cesarean section Bilateral tubal ligation, [place] method Mirena IUD insertion   PREOPERATIVE DIAGNOSES: 1. Intrauterine pregnancy at [redacted]w[redacted]d 2. History of previous cesarean section x 3 3. Uterine contractions 4. Desires surgical sterilization 5. Desires IUD contraception   POSTOPERATIVE DIAGNOSES: 1. Same, s/p rLTCS with IUD insertion and BTL   QBL: 750cc DRAINS: foley catheter to gravity drainage, 150 ml of clear urine at end of the procedure TOTAL IV FLUIDS: SPECIMENS: bilateral fallopian tube segments COMPLICATIONS:  None   FINDINGS:  Viable female infant in cephalic presentation; APGARs 8/9; weight 2910 grams (6lbs, 7oz) Clear fluid at amniotomy Intact placenta with 3 vessel cord Uterus, tubes, and ovaries appeared normal   INDICATION and CONSENT: Jessica Mitchell is a 32 y.o. [id number] with IUP at [redacted]w[redacted]d presenting for painful uterine contractions, found to be dilated to 5cm, in setting of 3 prior cesarean deliveries. She also desires surgical sterilization with IUD placement. The patient understood that the risks of cesarean section include, but are not limited to, visceral or vascular injury, infection, blood loss and need for transfusion, prolonged hospitalization, and reoperation.  She understood the tubal ligation is permanent sterilization.  The risks of the procedure include possible injury to internal organs and intra-abdominal bleeding. There is also a small risk that the procedure will fail (3-06/998), which may result in future pregnancy or ectopic pregnancy.  The patient agreed to the procedure and signed the consent form.  The patient stated understanding and desired to proceed.  All questions were answered.   PROCEDURE:  After verbal and written informed consent was obtained, the  patient was taken to the operating room where spinal anesthesia was found to be adequate.  SCDs were applied to the lower extremities and a Foley catheter was placed in the bladder under sterile technique.  The patient was placed in dorsal supine position with a leftward tilt, prepped and draped in a sterile fashion.  Two grams of Cefazolin and 500mg  of Azithromycin were given for infection prophylaxis.  Level of anesthesia was confirmed to be adequate with Allis clamps.  A Pfannenstiel skin incision was made with the scalpel and carried down to the underlying layer of rectus fascia.  The fascia was nicked bilaterally in the midline with the scalpel and the fascial incision was extended laterally using Mayo scissors.  The superior aspect of the fascia was grasped with Kocher clamps and the underlying rectus muscles were dissected off sharply with Mayo scissors and bluntly.  In a similar fashion, the inferior aspect of fascia was grasped with Kocher clamps and the underlying rectus and pyramidalis were dissected off sharply and bluntly.  The rectus muscles were separated in the midline with the Bovie.  The peritoneum was found to be free of adherent bowel and entered bluntly.  The peritoneal incision was extended bluntly to the bladder reflection with good visualization of the bladder.   The Alexis retractor was inserted and vesicouterine peritoneum identified.  Intraabdomnial survey revealed scant, clear peritoneal fluid and a thinned-out lower uterine segment. A bladder flap was not developed. The lower uterine segment was incised transversely with the scalpel.  The amniotic sac was ruptured with an Allis clamp and clear fluid noted.  The uterine incision was extended bluntly in a cranial-caudal fashion.   The fetus was in cephalic presentation.  The head  was flexed and elevated to the level of the uterine incision.  Gentle fundal pressure was applied and the infant was delivered without difficulty.  The nose  and mouth were suctioned with a bulb. Delayed cord clamping was performed for 60 seconds. The cord was doubly clamped and cut and cord blood obtained.  The infant was handed to the awaiting NICU team.  The placenta delivered intact & spontaneously with manual massage of the uterine fundus. The uterus was left in situ.  The inside of the uterus was gently wiped with lap sponges x 2 ensuring complete removal of placental membranes.  The Mirena IUD was then opened and the device was placed to the uterine fundus. The strings were grasped with ring forceps and inserted into the lower uterine segment.  The uterine incision was then repaired with a double layer closure of 0-Vicryl first in a locking fashion, followed by 0-Vicryl in an imbricating stitch. An additional figure of eight was placed at the left hysterotomy corner with a small piece of Fibrillar placed on top and excellent hemostasis was observed.The ovaries and tubes were found to be grossly normal.     Attention was then turned to the fallopian tubes. The left fallopian tube was identified and grasped with a Babcock clamp.  The tube was followed out to the fimbriae. A window was created in the mesosalpinx. The distal and proximal ends of the tube were individually ligated with 3-0 Plain suture. The tube was excised and sent to pathology for confirmation. Good hemostasis on both ends of the tubal stumps were noted. The peritoneal edge of the broad ligament was noted to be hemostatic. The tube was returned to the abdominal cavity.  The right fallopian tube was ligated in a similar fashion.  Good hemostasis was again noted.    The uterus, tubes, and ovaries were then returned to the abdominal cavity in the anatomical position.  The posterior cul-de-sac was cleared of clots and debris. Blood clots, debris and fluid were cleaned from the abdomen, gutters, and pelvis with moist laparotomy sponges.  The uterine incision was reinspected and was hemostatic, as were  the tubal sites.   The superior and inferior fascia were grasped with Kocher clamps and the rectus muscles were examined and found to be hemostatic, ensured with Bovie electrocautery.  The peritoneum was closed with 3-0 Monocryl in a continuous, running fashion, and the rectus muscles were not reapproximated.  The fascial layer was closed with 0-Vicryl in a running fashion.  The subcutaneous tissue was irrigated, made hemostatic with Bovie electrocautery, then reapproximated with a running layer of 3-0 Plain. The skin was closed subcuticularly with 4-0 Vicryl on a keith and steri-strips and a sterile pressure dressing was placed.  All sponge, lap and instrument counts were correct x 2. The patient tolerated the procedure well and was taken to the recovery room in stable condition.       Julieanne Manson, DO [facility]

## 2022-12-08 NOTE — Telephone Encounter (Signed)
Pt left msg on triage stating she is having pain in between her shoulder blades. Called pt back, she answer and said she was unavailable at the moment because she was at the doctor with her baby. She will call back.

## 2022-12-08 NOTE — Telephone Encounter (Signed)
Ok we are aware. Please make sure whomever sees her for the 6wk knows she wants the IUD re-inserted.

## 2022-12-08 NOTE — Telephone Encounter (Signed)
Pt called back and said pain is a little strong. She's already taking tylenol/ibuprofen and oxycodone because of her csection. Pain is less since taking pain meds. Advised Maralyn Sago and Shanda Bumps (our midwifes) say it can be normal given anesthesia given through her back and gas (bloating), that she can take simethicone. Pt will f/u as needed.

## 2022-12-14 NOTE — Progress Notes (Unsigned)
Entered in error

## 2022-12-14 NOTE — Patient Instructions (Signed)

## 2022-12-15 ENCOUNTER — Ambulatory Visit: Payer: Medicaid Other | Admitting: Obstetrics and Gynecology

## 2022-12-15 ENCOUNTER — Encounter: Payer: Self-pay | Admitting: Obstetrics and Gynecology

## 2022-12-15 VITALS — BP 131/91 | HR 51 | Resp 16 | Ht 63.0 in | Wt 148.7 lb

## 2022-12-15 DIAGNOSIS — Z1332 Encounter for screening for maternal depression: Secondary | ICD-10-CM | POA: Diagnosis not present

## 2022-12-15 DIAGNOSIS — Z9851 Tubal ligation status: Secondary | ICD-10-CM

## 2022-12-15 DIAGNOSIS — Z98891 History of uterine scar from previous surgery: Secondary | ICD-10-CM

## 2022-12-15 DIAGNOSIS — Z4889 Encounter for other specified surgical aftercare: Secondary | ICD-10-CM

## 2022-12-15 NOTE — Progress Notes (Signed)
OBSTETRICS/GYNECOLOGY POST-OPERATIVE CLINIC VISIT  Subjective:     Jessica Mitchell is a 32 y.o. [phone] female who presents to the clinic 1 weeks status post REPEAT CESAREAN SECTION WITH BILATERAL TUBAL LIGATION  for History of 3 C-sections, undesired fertility.  Patient notes that she also had an IUD inserted at the time of her C-section due to concerns for more painful or heavy menstrual cycles after tubal ligation.  She reports that the day after she was discharged from the hospital that she noted expulsion of her IUD.  Eating a regular diet with difficulty. Bowel movements are abnormal with diarrhea after she eats . Pain is controlled with current analgesics. Medications being used: acetaminophen and prescription NSAID's including ibuprofen (Motrin).  Patient is currently breast and formula feeding her infant without any issues.  Notes that her mood has been good.  [place] depression screening is negative today.  The following portions of the patient's history were reviewed and updated as appropriate: allergies, current medications, past family history, past medical history, past social history, past surgical history, and problem list.  Review of Systems Pertinent items are noted in HPI.   Objective:   BP (!) 131/91   Pulse (!) 51   Resp 16   Ht 5\' 3"  (1.6 m)   Wt 148 lb 11.2 oz (67.4 kg)   Breastfeeding Yes   BMI 26.34 kg/m  Body mass index is 26.34 kg/m.  General:  alert and no distress  Abdomen: soft, bowel sounds active, non-tender  Incision:   healing well, no drainage, no erythema, no hernia, no seroma, no swelling, no dehiscence, incision well approximated       12/15/2022   11:15 AM 12/05/2022    4:00 AM 04/28/2022    2:28 PM 11/12/2020    5:56 PM 11/12/2020    8:30 AM  Edinburgh Postnatal Depression Scale Screening Tool  I have been able to laugh and see the funny side of things. 0 0 0 0 --  I have looked forward with enjoyment to things. 0 0 0 0   I have  blamed myself unnecessarily when things went wrong. 0 0 0 0   I have been anxious or worried for no good reason. 0 0 0 2   I have felt scared or panicky for no good reason. 0 0 0 2   Things have been getting on top of me. 1 0 1 1   I have been so unhappy that I have had difficulty sleeping. 0 0 0 0   I have felt sad or miserable. 0 0 0 0   I have been so unhappy that I have been crying. 0 0 0 0   The thought of harming myself has occurred to me. 0 0 0 0   Edinburgh Postnatal Depression Scale Total 1 0 1 5      Pathology:  FINAL DIAGNOSIS        1. Fallopian tube, left, Segment :       -  COMPLETE CROSS SECTION OF FALLOPIAN TUBE        2. Fallopian tube, right, Segment :       -  COMPLETE CROSS SECTION OF FALLOPIAN TUBE   Assessment:   Patient s/p REPEAT CESAREAN SECTION WITH BILATERAL TUBAL LIGATION  IUD expulsion Doing well postoperatively.   Plan:   1. Continue any current medications as instructed by provider. 2. Wound care discussed. 3. Operative findings again reviewed. Pathology report discussed. 4. Activity restrictions:  no bending, stooping, or squatting, no lifting more than 10-15 pounds, and pelvic rest 5. Anticipated return to work:  5 weeks if applicable . 6. Follow up: 5 weeks for final postpartum visit.  Patient notes that she is thinking about having the Nexplanon inserted.  Can discuss more at her postpartum visit, as her cycles may actually be normal after her tubal ligation and may not require the use of additional hormonal suppression.Hildred Laser, MD [facility]

## 2022-12-17 NOTE — Plan of Care (Signed)
 [facility] Tonsillectomy/Adenoidectomy, Postoperative PEDS care plan entered in error.

## 2022-12-22 ENCOUNTER — Other Ambulatory Visit: Payer: Self-pay | Admitting: Obstetrics

## 2023-01-19 ENCOUNTER — Ambulatory Visit: Payer: Medicaid Other | Admitting: Obstetrics

## 2023-01-19 ENCOUNTER — Encounter: Payer: Self-pay | Admitting: Obstetrics

## 2023-01-19 NOTE — Progress Notes (Signed)
   POSTPARTUM CLINIC PROGRESS NOTE  Subjective:     Jessica Mitchell is a 32 y.o. [phone] female who presents for a postpartum visit. She is 6 week postpartum following a low cervical transverse Cesarean section. I have reviewed the prenatal and intrapartum course. The delivery was at [redacted]w[redacted]d gestational weeks.  Anesthesia: spinal. Postpartum course has been normal . Baby's course has been Normal . Baby is feeding by breast & bottle - Nutramigen  . Bleeding: patient has not not resumed menses, with No LMP recorded.. Bowel function is normal. Bladder function is normal. Patient is not sexually active. Contraception method desired is tubal ligation. Postpartum depression screening: negative.    EDPS score is 5. #10 is never.   The following portions of the patient's history were reviewed and updated as appropriate: allergies, current medications, past family history, past medical history, past social history, past surgical history, and problem list.  Review of Systems Pertinent items are noted in HPI.   Objective:    BP 120/80   Pulse 68   Ht 5\' 3"  (1.6 m)   Wt 136 lb (61.7 kg)   Breastfeeding Yes   BMI 24.09 kg/m   General:  alert and no distress   Breasts:  inspection negative, no nipple discharge or bleeding, no masses or nodularity palpable  Lungs: clear to auscultation bilaterally  Heart:  regular rate and rhythm, S1, S2 normal, no murmur, click, rub or gallop  Abdomen: soft, non-tender; bowel sounds normal; no masses,  no organomegaly.  Well healed Pfannenstiel incision   Vulva:  normal  Vagina: normal vagina, no discharge, exudate, lesion, or erythema  Cervix:  no cervical motion tenderness and no lesions  Corpus: normal size, contour, position, consistency, mobility, non-tender  Adnexa:  normal adnexa and no mass, fullness, tenderness  Rectal Exam: Not performed.         Labs:  Lab Results  Component Value Date   HGB 7.9 (L) 12/04/2022     Assessment:   1. 6 weeks  postpartum follow-up      Plan:  31yo [id number] s/p rLTCS with BTL at [redacted]w[redacted]d for repeat, in spontaneous labor, no complications. Today is doing well. EPDS=5.  -Contraception: s/p BTL, discussed IUD expulsion and pt does not desire another IUD. Will wait and see once menses resumes (is breastfeeding) if she desires hormonal contraception for period control.  -Okay to resume all regular activity as tolerated-postpartum fitness discussed. Do exercises 3-5 times weekly.  -Reviewed returning to intercourse expectations. -Hair loss and pelvic floor function expectations discussed. -Continue PNV as daily multivitamin. -Moods reviewed, alert clinic if developing PPD/A -RTC for WWE in 5 months, sooner if concerns.  Julieanne Manson DO [facility]

## 2023-05-11 ENCOUNTER — Ambulatory Visit: Admitting: Obstetrics

## 2023-05-17 ENCOUNTER — Ambulatory Visit: Admitting: Obstetrics

## 2023-05-17 NOTE — Progress Notes (Deleted)
    GYNECOLOGY PROGRESS NOTE  Subjective:  PCP: Barbette Merino, NP  Patient ID: Jessica Mitchell, female    DOB: Mar 18, 1990, 33 y.o.   MRN: [id number]  HPI  Patient is a 33 y.o. [phone] female who presents for birth control consult.   {Common ambulatory SmartLinks:19316}  Review of Systems {ros; complete:30496}   Objective:   currently breastfeeding. There is no height or weight on file to calculate BMI.  General appearance: {general exam:16600} Abdomen: {abdominal exam:16834} Pelvic: {pelvic exam:16852::"cervix normal in appearance","external genitalia normal","no adnexal masses or tenderness","no cervical motion tenderness","rectovaginal septum normal","uterus normal size, shape, and consistency","vagina normal without discharge"} Extremities: {extremity exam:5109} Neurologic: {neuro exam:17854}   Assessment/Plan:   No diagnosis found.   There are no diagnoses linked to this encounter.     Julieanne Manson, DO [facility]

## 2023-06-09 ENCOUNTER — Ambulatory Visit: Admitting: Licensed Practical Nurse

## 2023-06-16 ENCOUNTER — Encounter: Payer: Self-pay | Admitting: Licensed Practical Nurse

## 2023-09-12 ENCOUNTER — Encounter: Payer: Self-pay | Admitting: Certified Nurse Midwife

## 2023-09-12 ENCOUNTER — Ambulatory Visit (INDEPENDENT_AMBULATORY_CARE_PROVIDER_SITE_OTHER): Admitting: Certified Nurse Midwife

## 2023-09-12 VITALS — BP 125/84 | HR 56 | Ht 63.0 in | Wt 137.6 lb

## 2023-09-12 DIAGNOSIS — Z3202 Encounter for pregnancy test, result negative: Secondary | ICD-10-CM | POA: Diagnosis not present

## 2023-09-12 DIAGNOSIS — Z30017 Encounter for initial prescription of implantable subdermal contraceptive: Secondary | ICD-10-CM | POA: Diagnosis not present

## 2023-09-12 LAB — POCT URINE PREGNANCY: Preg Test, Ur: NEGATIVE

## 2023-09-12 MED ORDER — ETONOGESTREL 68 MG ~~LOC~~ IMPL
68.0000 mg | DRUG_IMPLANT | Freq: Once | SUBCUTANEOUS | Status: AC
Start: 2023-09-12 — End: 2023-09-12
  Administered 2023-09-12: 68 mg via SUBCUTANEOUS

## 2023-09-12 NOTE — Progress Notes (Signed)
     GYNECOLOGY OFFICE PROCEDURE NOTE  Jessica Mitchell is a 33 y.o. [phone] here for Nexplanon  insertion.  She has had a BTL for contraception. Since her youngest child's birth her cycles have been irregular, heavier & more painful. Immediate postpartum Mirena  was placed, however it was expelled <6w postpartum.  Last pap smear: Result Date Procedure Results Follow-ups  05/25/2022 Cytology - PAP Adequacy: Satisfactory for evaluation; transformation zone component ABSENT. Diagnosis: - Negative for intraepithelial lesion or malignancy (NILM)   04/15/2020 Cytology - PAP High risk HPV: Negative Trichomonas: Negative Chlamydia: Negative Neisseria Gonorrhea: Negative Adequacy: Satisfactory for evaluation; transformation zone component PRESENT. Diagnosis: - Negative for intraepithelial lesion or malignancy (NILM) Comment: Normal Reference Range HPV - Negative Comment: Normal Reference Ranger Chlamydia - Negative Comment: Normal Reference Range Neisseria Gonorrhea - Negative Comment: Normal Reference Range Trichomonas - Negative   08/10/2017 IGP, rfx Aptima HPV ASCU DIAGNOSIS:: Comment Specimen adequacy:: Comment Clinician Provided ICD10: Comment Performed by:: Comment PAP Smear Comment: . Note:: Comment Test Methodology: Comment PAP Reflex: Comment   01/31/2015 HM PAP SMEAR HM Pap smear: Negative, HPV negative    BP 125/84   Pulse (!) 56   Ht 5' 3 (1.6 m)   Wt 137 lb 9.6 oz (62.4 kg)   LMP 08/22/2023 (Approximate)   Breastfeeding No   BMI 24.37 kg/m   Nexplanon  insertion Procedure Patient identified, informed consent performed, consent signed.   Patient does understand that irregular bleeding is a very common side effect of this medication. She had a BTL with her Cesarean delivery was advised to have backup contraception for at least one week after placement. Pregnancy test in clinic today was negative.  Appropriate time out taken.  Patient's left arm was prepped and draped in the  usual sterile fashion. Patient was prepped with alcohol swab and then injected with 3 ml of 1% lidocaine .  She was prepped with chloraprep, Nexplanon  removed from packaging,  Device confirmed in needle, then inserted full length of needle and withdrawn per handbook instructions. Nexplanon  was able to palpated in the patient's arm; patient palpated the insert herself. There was minimal blood loss.  Patient insertion site covered with guaze and a pressure bandage to reduce any bruising.  The patient tolerated the procedure well and was given post procedure instructions.   Harlene LITTIE Cisco, CNM

## 2023-10-26 ENCOUNTER — Ambulatory Visit: Admitting: Certified Nurse Midwife
